# Patient Record
Sex: Female | Born: 1940
Health system: Southern US, Community
[De-identification: ages and names within clinical notes are randomized; demographics above are authoritative.]

## PROBLEM LIST (undated history)

## (undated) DIAGNOSIS — J302 Other seasonal allergic rhinitis: Secondary | ICD-10-CM

## (undated) DIAGNOSIS — K219 Gastro-esophageal reflux disease without esophagitis: Secondary | ICD-10-CM

## (undated) DIAGNOSIS — Z8619 Personal history of other infectious and parasitic diseases: Secondary | ICD-10-CM

## (undated) DIAGNOSIS — L239 Allergic contact dermatitis, unspecified cause: Secondary | ICD-10-CM

## (undated) DIAGNOSIS — S83232A Complex tear of medial meniscus, current injury, left knee, initial encounter: Secondary | ICD-10-CM

## (undated) DIAGNOSIS — K5792 Diverticulitis of intestine, part unspecified, without perforation or abscess without bleeding: Secondary | ICD-10-CM

## (undated) DIAGNOSIS — I10 Essential (primary) hypertension: Secondary | ICD-10-CM

## (undated) DIAGNOSIS — H903 Sensorineural hearing loss, bilateral: Secondary | ICD-10-CM

## (undated) DIAGNOSIS — M1990 Unspecified osteoarthritis, unspecified site: Secondary | ICD-10-CM

## (undated) DIAGNOSIS — D126 Benign neoplasm of colon, unspecified: Secondary | ICD-10-CM

## (undated) DIAGNOSIS — L309 Dermatitis, unspecified: Secondary | ICD-10-CM

## (undated) DIAGNOSIS — E785 Hyperlipidemia, unspecified: Secondary | ICD-10-CM

## (undated) HISTORY — DX: Hyperlipidemia, unspecified: E78.5

## (undated) HISTORY — DX: Essential (primary) hypertension: I10

## (undated) HISTORY — PX: COLONOSCOPY: SHX174

## (undated) HISTORY — DX: Allergic contact dermatitis, unspecified cause: L23.9

## (undated) HISTORY — DX: Unspecified osteoarthritis, unspecified site: M19.90

## (undated) HISTORY — DX: Dermatitis, unspecified: L30.9

## (undated) HISTORY — PX: CHOLECYSTECTOMY: SHX55

## (undated) HISTORY — PX: TUBAL LIGATION: SHX77

## (undated) HISTORY — PX: HEMORRHOID SURGERY: SHX153

---

## 2005-05-29 ENCOUNTER — Encounter: Payer: Self-pay | Admitting: Family Medicine

## 2008-01-24 ENCOUNTER — Ambulatory Visit: Payer: Self-pay | Admitting: Cardiology

## 2008-01-25 ENCOUNTER — Ambulatory Visit: Payer: Self-pay | Admitting: Cardiology

## 2008-01-25 LAB — CONVERTED CEMR LAB
Alkaline Phosphatase: 78 units/L (ref 39–117)
BUN: 17 mg/dL (ref 6–23)
Cholesterol: 157 mg/dL (ref 0–200)
Glucose, Bld: 88 mg/dL (ref 70–99)
HDL: 70 mg/dL (ref >39)
LDL Cholesterol: 70 mg/dL (ref 0–99)
Total Bilirubin: 0.6 mg/dL (ref 0.3–1.2)
Triglycerides: 84 mg/dL (ref <150)
VLDL: 17 mg/dL (ref 0–40)

## 2008-03-20 ENCOUNTER — Ambulatory Visit: Payer: Self-pay | Admitting: Family Medicine

## 2008-03-20 DIAGNOSIS — M1990 Unspecified osteoarthritis, unspecified site: Secondary | ICD-10-CM | POA: Insufficient documentation

## 2008-03-20 DIAGNOSIS — I10 Essential (primary) hypertension: Secondary | ICD-10-CM | POA: Insufficient documentation

## 2008-03-20 DIAGNOSIS — J309 Allergic rhinitis, unspecified: Secondary | ICD-10-CM | POA: Insufficient documentation

## 2008-03-20 DIAGNOSIS — L259 Unspecified contact dermatitis, unspecified cause: Secondary | ICD-10-CM | POA: Insufficient documentation

## 2008-04-20 ENCOUNTER — Encounter: Payer: Self-pay | Admitting: Family Medicine

## 2008-04-27 ENCOUNTER — Encounter: Payer: Self-pay | Admitting: Family Medicine

## 2008-04-30 LAB — HM MAMMOGRAPHY: HM Mammogram: NORMAL

## 2008-05-03 ENCOUNTER — Encounter (INDEPENDENT_AMBULATORY_CARE_PROVIDER_SITE_OTHER): Payer: Self-pay | Admitting: *Deleted

## 2008-07-13 ENCOUNTER — Encounter: Payer: Self-pay | Admitting: Family Medicine

## 2008-07-13 ENCOUNTER — Ambulatory Visit: Payer: Self-pay | Admitting: Gastroenterology

## 2008-07-13 LAB — HM COLONOSCOPY

## 2008-08-03 ENCOUNTER — Encounter: Payer: Self-pay | Admitting: Family Medicine

## 2008-12-28 ENCOUNTER — Ambulatory Visit: Payer: Self-pay | Admitting: Family Medicine

## 2008-12-28 ENCOUNTER — Encounter: Payer: Self-pay | Admitting: Family Medicine

## 2008-12-28 ENCOUNTER — Other Ambulatory Visit: Admission: RE | Admit: 2008-12-28 | Discharge: 2008-12-28 | Payer: Self-pay | Admitting: Family Medicine

## 2008-12-28 DIAGNOSIS — M549 Dorsalgia, unspecified: Secondary | ICD-10-CM | POA: Insufficient documentation

## 2008-12-28 LAB — CONVERTED CEMR LAB
Bilirubin Urine: NEGATIVE
Glucose, Urine, Semiquant: NEGATIVE
Protein, U semiquant: NEGATIVE
Specific Gravity, Urine: 1.01
Urobilinogen, UA: 0.2

## 2009-01-01 ENCOUNTER — Encounter (INDEPENDENT_AMBULATORY_CARE_PROVIDER_SITE_OTHER): Payer: Self-pay | Admitting: *Deleted

## 2009-01-10 ENCOUNTER — Ambulatory Visit: Payer: Self-pay | Admitting: Family Medicine

## 2009-01-16 LAB — CONVERTED CEMR LAB
AST: 21 units/L (ref 0–37)
Alkaline Phosphatase: 87 units/L (ref 39–117)
BUN: 14 mg/dL (ref 6–23)
Chloride: 102 meq/L (ref 96–112)
Cholesterol: 156 mg/dL (ref 0–200)
GFR calc non Af Amer: 59 mL/min
LDL Cholesterol: 86 mg/dL (ref 0–99)
Potassium: 3.8 meq/L (ref 3.5–5.1)
Total Bilirubin: 0.9 mg/dL (ref 0.3–1.2)
Total CHOL/HDL Ratio: 3

## 2009-04-25 ENCOUNTER — Encounter: Payer: Self-pay | Admitting: Family Medicine

## 2009-06-19 ENCOUNTER — Ambulatory Visit: Payer: Self-pay | Admitting: Family Medicine

## 2009-06-19 DIAGNOSIS — J019 Acute sinusitis, unspecified: Secondary | ICD-10-CM | POA: Insufficient documentation

## 2009-09-02 ENCOUNTER — Ambulatory Visit: Payer: Self-pay | Admitting: Family Medicine

## 2009-09-02 DIAGNOSIS — J069 Acute upper respiratory infection, unspecified: Secondary | ICD-10-CM | POA: Insufficient documentation

## 2010-01-14 ENCOUNTER — Ambulatory Visit: Payer: Self-pay | Admitting: Family Medicine

## 2010-01-14 DIAGNOSIS — R0989 Other specified symptoms and signs involving the circulatory and respiratory systems: Secondary | ICD-10-CM

## 2010-01-14 DIAGNOSIS — R0609 Other forms of dyspnea: Secondary | ICD-10-CM | POA: Insufficient documentation

## 2010-01-15 ENCOUNTER — Ambulatory Visit: Payer: Self-pay | Admitting: Family Medicine

## 2010-01-15 DIAGNOSIS — R5383 Other fatigue: Secondary | ICD-10-CM

## 2010-01-15 DIAGNOSIS — R5381 Other malaise: Secondary | ICD-10-CM

## 2010-01-16 LAB — CONVERTED CEMR LAB
Albumin: 3.9 g/dL (ref 3.5–5.2)
Alkaline Phosphatase: 84 units/L (ref 39–117)
Basophils Relative: 0.4 % (ref 0.0–3.0)
Bilirubin, Direct: 0.1 mg/dL (ref 0.0–0.3)
Calcium: 9.1 mg/dL (ref 8.4–10.5)
Eosinophils Absolute: 0.5 10*3/uL (ref 0.0–0.7)
GFR calc non Af Amer: 58.84 mL/min (ref >60)
HDL: 58 mg/dL (ref >39.00)
Hemoglobin: 12.7 g/dL (ref 12.0–15.0)
Lymphocytes Relative: 35 % (ref 12.0–46.0)
MCHC: 32.9 g/dL (ref 30.0–36.0)
MCV: 94.2 fL (ref 78.0–100.0)
Neutro Abs: 4.3 10*3/uL (ref 1.4–7.7)
RBC: 4.1 M/uL (ref 3.87–5.11)
Sodium: 140 meq/L (ref 135–145)
Total CHOL/HDL Ratio: 3
VLDL: 15.6 mg/dL (ref 0.0–40.0)

## 2010-01-28 ENCOUNTER — Ambulatory Visit: Payer: Self-pay

## 2010-01-28 ENCOUNTER — Encounter: Payer: Self-pay | Admitting: Family Medicine

## 2010-01-30 ENCOUNTER — Telehealth: Payer: Self-pay | Admitting: Family Medicine

## 2010-02-06 ENCOUNTER — Ambulatory Visit: Payer: Self-pay | Admitting: Cardiovascular Disease

## 2010-02-19 ENCOUNTER — Telehealth (INDEPENDENT_AMBULATORY_CARE_PROVIDER_SITE_OTHER): Payer: Self-pay | Admitting: *Deleted

## 2010-11-25 ENCOUNTER — Encounter: Payer: Self-pay | Admitting: Family Medicine

## 2010-12-30 NOTE — Progress Notes (Signed)
Summary: HYDROCHLOROTHIAZIDE 25 MG  Phone Note Refill Request Call back at Home Phone [phone] Message from:  Patient on February 19, 2010 11:36 AM  Refills Requested: Medication #1:  HYDROCHLOROTHIAZIDE 25 MG  TABS Take 1/2 tablet by mouth once daily [facility] [address]   Initial call taken by: Melody Comas,  February 19, 2010 11:36 AM    Prescriptions: HYDROCHLOROTHIAZIDE 25 MG  TABS (HYDROCHLOROTHIAZIDE) Take 1/2 tablet by mouth once daily  #30.0 Each x 11   Entered by:   Benny Lennert CMA (AAMA)   Authorized by:   Kerby Nora MD   Signed by:   Benny Lennert CMA (AAMA) on 02/19/2010   Method used:   Electronically to        [facility] [address] [phone]* (retail)       [address] [place]  [address]       Ph: [phone]       Fax: [phone]   RxID:   [id number]

## 2010-12-30 NOTE — Progress Notes (Signed)
Summary: PHI  PHI   Imported By: Harlon Flor 02/10/2010 16:01:08  _____________________________________________________________________  External Attachment:    Type:   Image     Comment:   External Document

## 2010-12-30 NOTE — Assessment & Plan Note (Signed)
Summary: CPX/CLE   Vital Signs:  Patient profile:   70 year old female Height:      65.5 inches Weight:      192.8 pounds BMI:     31.71 Temp:     98.2 degrees F oral Pulse rate:   76 / minute Pulse rhythm:   regular BP sitting:   118 / 78  (left arm) Cuff size:   regular  Vitals Entered By: Benny Lennert CMA Duncan Dull) (January 14, 2010 2:42 PM)  History of Present Illness: Chief complaint chronic medical managment  Has had some increase in SOB in last 6 months, when going up stairs.  No chest pain.  Some increase in fatigue. No new swelling in ankles. No cough, no wheeze. Continues to have post nasal drip,  sometimes has clearing cough from that. Has noted some improvement in SOB with recent increase in exercsie. Treadmill stress... 2006 low risk.  Interested in screening for carotid blockage. No specific symptoms. Has had high chol for 10 years. PGF: had CVA.   Had noted blister right 2nd finger DIP joint after raking 1 week ago. Was wearing gloves. Had similar in past.  Arhtritis in DIP joints B hands.  Some intermittant rash under right breast..red irritated area. ? allergy to soap.  not itchy.  Hemmorhoids irritated off and on.   Problems Prior to Update: 1)  Other Malaise and Fatigue  (ICD-780.79) 2)  Dyspnea On Exertion  (ICD-786.09) 3)  Uri  (ICD-465.9) 4)  Sinusitis - Acute-nos  (ICD-461.9) 5)  Hypertension  (ICD-401.9) 6)  Hyperlipidemia  (ICD-272.4) 7)  Back Pain, Acute  (ICD-724.5) 8)  Osteoarthritis  (ICD-715.90) 9)  Dermatitis, Allergic  (ICD-692.9) 10)  Allergic Rhinitis  (ICD-477.9) 11)  Family History of Colon Ca 1st Degree Relative <60  (ICD-V16.0) 12)  Other Screening Mammogram  (ICD-V76.12) 13)  Special Screening For Malignant Neoplasms Colon  (ICD-V76.51) 14)  Special Screening For Osteoporosis  (ICD-V82.81)  Current Medications (verified): 1)  Lisinopril 10 Mg  Tabs (Lisinopril) .... Take 1 Tablet By Mouth Once A Day 2)   Hydrochlorothiazide 25 Mg  Tabs (Hydrochlorothiazide) .... Take 1/2 Tablet By Mouth Once Daily 3)  Lipitor 10 Mg  Tabs (Atorvastatin Calcium) .... Take 1 Tablet By Mouth Once A Day 4)  Fibercon 625 Mg  Tabs (Calcium Polycarbophil) .... Takes 1 Capsules About 3 Times Per Week 5)  Aspir-Low 81 Mg Tbec (Aspirin) .... Once Daily 6)  Proctosol Hc 2.5 % Crea (Hydrocortisone) .... Apply To Affected Area Two Times A Day 7)  Fluticasone Propionate 50 Mcg/act Susp (Fluticasone Propionate) .... 2 Spray Per Nostril Daily  Allergies (verified): No Known Drug Allergies  Past History:  Past medical, surgical, family and social histories (including risk factors) reviewed, and no changes noted (except as noted below).  Past Medical History: Reviewed history from 05/23/2009 and no changes required. HYPERTENSION (ICD-401.9) HYPERLIPIDEMIA (ICD-272.4) BACK PAIN, ACUTE (ICD-724.5) OSTEOARTHRITIS (ICD-715.90) DERMATITIS, ALLERGIC (ICD-692.9) ALLERGIC RHINITIS (ICD-477.9)      Past Surgical History: Cholecystectomy Hemorrhoidectomy 2006 treadmill stress test..low risk  Family History: Reviewed history from 03/20/2008 and no changes required. father: emphesema, DM mother: HTN, colon cancer age 47 sister: breast cancer brother: bladder cancer, CAD PGM: enlarged heart PGF: CVA MGM: CAD MGF: measles Family History of Colon CA 1st degree relative <60  Social History: Reviewed history from 03/20/2008 and no changes required. Occupation: retired Librarian, academic Married 2 boys: healthy Never Smoked Alcohol use-yes, occ glass of wine Drug use-no Regular exercise-yes, walking  3 days a week  Review of Systems General:  Complains of fatigue. CV:  Complains of shortness of breath with exertion; denies chest pain or discomfort, difficulty breathing while lying down, and leg cramps with exertion. Resp:  Denies cough and wheezing. GI:  Denies abdominal pain. GU:  Denies dysuria.  Physical  Exam  General:  overwieght  appearing female in NAD Mouth:  good dentition, pharynx pink and moist, no exudates, but postnasal drip.   Neck:  no cervical or supraclavicular lymphadenopathy  Chest Wall:  No deformities, masses, or tenderness noted. Breasts:  No mass, nodules, thickening, tenderness, bulging, retraction, inflamation, nipple discharge or skin changes noted.   Lungs:  Normal respiratory effort, chest expands symmetrically. Lungs are clear to auscultation, no crackles or wheezes. Heart:  Normal rate and regular rhythm. S1 and S2 normal without gallop, murmur, click, rub or other extra sounds. Abdomen:  Bowel sounds positive,abdomen soft and non-tender without masses, organomegaly or hernias noted. Msk:  No deformity or scoliosis noted of thoracic or lumbar spine.   Pulses:  R and L posterior tibial pulses are full and equal bilaterally  Extremities:  non edema Neurologic:  No cranial nerve deficits noted. Station and gait are normal. Plantar reflexes are down-going bilaterally. DTRs are symmetrical throughout. Sensory, motor and coordinative functions appear intact. Skin:  Intact without suspicious lesions or rashes Psych:  Cognition and judgment appear intact. Alert and cooperative with normal attention span and concentration. No apparent delusions, illusions, hallucinations   Impression & Recommendations:  Problem # 1:  DYSPNEA ON EXERTION (ICD-786.09) Moderate cardiac risk given age, overweight, HTN, high chol...cardiac stres test low risk in 2006 No celar evidence of fluoid overload and CHF...wil eval with ECHo. Will also eval for other sources given fatigue..cbc, TSH, etc.  Lung exam clear..some congestion allergies.Marland Kitchenno wheezing..? pulm source.  Her updated medication list for this problem includes:    Lisinopril 10 Mg Tabs (Lisinopril) .Marland Kitchen... Take 1 tablet by mouth once a day    Hydrochlorothiazide 25 Mg Tabs (Hydrochlorothiazide) .Marland Kitchen... Take 1/2 tablet by mouth once  daily  Orders: EKG w/ Interpretation (93000) Echo Referral (Echo)  Problem # 2:  OTHER MALAISE AND FATIGUE (ICD-780.79) Eval with labs as stated above.   Problem # 3:  URI (ICD-465.9)  Her updated medication list for this problem includes:    Aspir-low 81 Mg Tbec (Aspirin) ..... Once daily  Instructed on symptomatic treatment. Call if symptoms persist or worsen.   Problem # 4:  HYPERTENSION (ICD-401.9) Well controlled. Continue current medication.  Her updated medication list for this problem includes:    Lisinopril 10 Mg Tabs (Lisinopril) .Marland Kitchen... Take 1 tablet by mouth once a day    Hydrochlorothiazide 25 Mg Tabs (Hydrochlorothiazide) .Marland Kitchen... Take 1/2 tablet by mouth once daily  BP today: 118/78 Prior BP: 120/78 (09/02/2009)  Labs Reviewed: K+: 3.8 (01/10/2009) Creat: : 1.0 (01/10/2009)   Chol: 156 (01/10/2009)   HDL: 51.9 (01/10/2009)   LDL: 86 (01/10/2009)   TG: 92 (01/10/2009)  Problem # 5:  HYPERLIPIDEMIA (ICD-272.4) Well controlled. Continue current medication.  Her updated medication list for this problem includes:    Lipitor 10 Mg Tabs (Atorvastatin calcium) .Marland Kitchen... Take 1 tablet by mouth once a day  Labs Reviewed: SGOT: 21 (01/10/2009)   SGPT: 14 (01/10/2009)   HDL:51.9 (01/10/2009), 70 (01/25/2008)  LDL:86 (01/10/2009), 70 (01/25/2008)  Chol:156 (01/10/2009), 157 (01/25/2008)  Trig:92 (01/10/2009), 84 (01/25/2008)  Problem # 6:  Preventive Health Care (ICD-V70.0) Assessment: Comment Only PAP smear every  2-3 years.Marland Kitchenlast done last year, nml.  The patient's preventative maintenance and recommended screening tests for an annual wellness exam were reviewed in full today. Brought up to date unless services declined.  Counselled on the importance of diet, exercise, and its role in overall health and mortality. The patient's FH and SH was reviewed, including their home life, tobacco status, and drug and alcohol status.     Complete Medication List: 1)  Lisinopril 10 Mg  Tabs (Lisinopril) .... Take 1 tablet by mouth once a day 2)  Hydrochlorothiazide 25 Mg Tabs (Hydrochlorothiazide) .... Take 1/2 tablet by mouth once daily 3)  Lipitor 10 Mg Tabs (Atorvastatin calcium) .... Take 1 tablet by mouth once a day 4)  Fibercon 625 Mg Tabs (Calcium polycarbophil) .... Takes 1 capsules about 3 times per week 5)  Aspir-low 81 Mg Tbec (Aspirin) .... Once daily 6)  Proctosol Hc 2.5 % Crea (Hydrocortisone) .... Apply to affected area two times a day 7)  Fluticasone Propionate 50 Mcg/act Susp (Fluticasone propionate) .... 2 spray per nostril daily 8)  Vitamin D (ergocalciferol) 50000 Unit Caps (Ergocalciferol) .Marland Kitchen.. 1 tab by mouth weekly x 12 weeks  Other Orders: Radiology Referral (Radiology)  Patient Instructions: 1)  Fasting lipids, CMET Dx 272.0 2)  TSH, cbc, B12 , vit D Dx 780.79 3)  Referral Appointment Information 4)  Day/Date: 5)  Time: 6)  Place/MD: 7)  Address: 8)  Phone/Fax: 9)  Patient given appointment information. Information/Orders faxed/mailed.  10)  Start nasal steroid 2 puffs daily. 11)  Apply steroid to rectum two times a day.  12)  MAy use OTC steroid cream on right breast rash two times a day if desired.  Prescriptions: FLUTICASONE PROPIONATE 50 MCG/ACT SUSP (FLUTICASONE PROPIONATE) 2 spray per nostril daily  #1 x 3   Entered and Authorized by:   Kerby Nora MD   Signed by:   Kerby Nora MD on 01/14/2010   Method used:   Electronically to        [facility] [address] [phone]* (retail)       [address] [place], [place]  [address]       Ph: [phone]       Fax: [phone]   RxID:   [id number] PROCTOSOL HC 2.5 % CREA (HYDROCORTISONE) Apply to affected area two times a day  #1 x 2   Entered and Authorized by:   Kerby Nora MD   Signed by:   Kerby Nora MD on 01/14/2010   Method used:   Electronically to        [facility] [address] [phone]* (retail)       [address] [place], [place]  [address]       Ph:  [phone]       Fax: [phone]   RxID:   [id number]   Current Allergies (reviewed today): No known allergies   Last PAP:  NEGATIVE FOR INTRAEPITHELIAL LESIONS OR MALIGNANCY. (12/28/2008 12:00:00 AM) PAP Next Due:  2 yr   Past Medical History:    Reviewed history from 05/23/2009 and no changes required:       HYPERTENSION (ICD-401.9)       HYPERLIPIDEMIA (ICD-272.4)       BACK PAIN, ACUTE (ICD-724.5)       OSTEOARTHRITIS (ICD-715.90)       DERMATITIS, ALLERGIC (ICD-692.9)       ALLERGIC RHINITIS (ICD-477.9)  Past Surgical History:    Reviewed history from 03/20/2008 and no changes required:       Cholecystectomy       Hemorrhoidectomy       2006 treadmill stress test..low risk

## 2010-12-30 NOTE — Progress Notes (Signed)
Summary: pt agrees to cardio referral  Phone Note Outgoing Call Call back at [facility] Phone [phone]   Summary of Call: Advised pt of 2D echo report.  She agrees to referral, has met Dr. Lenard Galloway before, wants to go to him. Initial call taken by: Lowella Petties CMA,  January 30, 2010 11:41 AM

## 2010-12-30 NOTE — Assessment & Plan Note (Signed)
Summary: NEW PT  Medications Added CRESTOR 20 MG TABS (ROSUVASTATIN CALCIUM) Take one tablet by mouth daily.      Allergies Added: ! * SHELLFISH  Visit Type:  New Patient Referring Provider:  Kerby Nora, MD Primary Provider:  Kerby Nora MD  CC:  chest pains and some shortness of breath with exertion and bending over; no edema.  History of Present Illness: Vanessa Jones is a very pleasant 70 year old woman with a history of hypertension, hyperlipidemia, osteoarthritis who presented to Dr. Ermalene Searing for workup of her dyspnea on exertion. She presents to discuss her echo findings.  Vanessa Jones states that she's been feeling well. She has had some very short episodes of chest discomfort, some shortness of breath sometimes with exertion and when she bends over. She denies any lightheadedness, lower extremity edema, diaphoresis. She is typically very active. She walks 30 minutes twice a week. She states that she sleeps well, and feels rested when she wakes up. She sleeps on one pillow.  She has been on Lipitor since 2001. She denies any significant coronary disease in her parents or her siblings. She retired in 2004.  echo findings shows normal left and right ventricular systolic function, mildly dilated left ventricle and left atrium, no significant valvular disease, normal right ventricular systolic pressures, aneurysmal intra-atrial septum.   Current Problems (verified): 1)  Other Malaise and Fatigue  (ICD-780.79) 2)  Dyspnea On Exertion  (ICD-786.09) 3)  Uri  (ICD-465.9) 4)  Sinusitis - Acute-nos  (ICD-461.9) 5)  Hypertension  (ICD-401.9) 6)  Hyperlipidemia  (ICD-272.4) 7)  Back Pain, Acute  (ICD-724.5) 8)  Osteoarthritis  (ICD-715.90) 9)  Dermatitis, Allergic  (ICD-692.9) 10)  Allergic Rhinitis  (ICD-477.9) 11)  Family History of Colon Ca 1st Degree Relative <60  (ICD-V16.0) 12)  Other Screening Mammogram  (ICD-V76.12) 13)  Special Screening For Malignant Neoplasms Colon   (ICD-V76.51) 14)  Special Screening For Osteoporosis  (ICD-V82.81)  Current Medications (verified): 1)  Lisinopril 10 Mg  Tabs (Lisinopril) .... Take 1 Tablet By Mouth Once A Day 2)  Hydrochlorothiazide 25 Mg  Tabs (Hydrochlorothiazide) .... Take 1/2 Tablet By Mouth Once Daily 3)  Lipitor 10 Mg  Tabs (Atorvastatin Calcium) .... Take 1 Tablet By Mouth Once A Day 4)  Fibercon 625 Mg  Tabs (Calcium Polycarbophil) .... Takes 1 Capsules About 3 Times Per Week 5)  Aspir-Low 81 Mg Tbec (Aspirin) .... Once Daily 6)  Proctosol Hc 2.5 % Crea (Hydrocortisone) .... Apply To Affected Area Two Times A Day 7)  Fluticasone Propionate 50 Mcg/act Susp (Fluticasone Propionate) .... 2 Spray Per Nostril Daily 8)  Vitamin D (Ergocalciferol) 50000 Unit Caps (Ergocalciferol) .Marland Kitchen.. 1 Tab By Mouth Weekly X 12 Weeks  Allergies (verified): 1)  ! * Shellfish  Past History:  Past Medical History: Last updated: 05/23/2009 HYPERTENSION (ICD-401.9) HYPERLIPIDEMIA (ICD-272.4) BACK PAIN, ACUTE (ICD-724.5) OSTEOARTHRITIS (ICD-715.90) DERMATITIS, ALLERGIC (ICD-692.9) ALLERGIC RHINITIS (ICD-477.9)      Past Surgical History: Last updated: 01/14/2010 Cholecystectomy Hemorrhoidectomy 2006 treadmill stress test..low risk  Family History: Last updated: 03/20/2008 father: emphesema, DM mother: HTN, colon cancer age 6 sister: breast cancer brother: bladder cancer, CAD PGM: enlarged heart PGF: CVA MGM: CAD MGF: measles Family History of Colon CA 1st degree relative <60  Social History: Last updated: 03/20/2008 Occupation: retired Librarian, academic Married 2 boys: healthy Never Smoked Alcohol use-yes, occ glass of wine Drug use-no Regular exercise-yes, walking 3 days a week  Risk Factors: Exercise: yes (03/20/2008)  Risk Factors: Smoking Status: never (03/20/2008)  Review of Systems       The patient complains of dyspnea on exertion.  The patient denies fever, weight loss, weight gain, vision loss,  decreased hearing, hoarseness, chest pain, syncope, peripheral edema, prolonged cough, abdominal pain, incontinence, muscle weakness, difficulty walking, depression, unusual weight change, and enlarged lymph nodes.    Vital Signs:  Patient profile:   70 year old female Height:      65.5 inches Weight:      195.50 pounds BMI:     32.15 Pulse rate:   88 / minute Pulse rhythm:   regular BP sitting:   124 / 78  (left arm) Cuff size:   regular  Vitals Entered By: Vanessa Jones (February 06, 2010 4:24 PM)  Physical Exam  General:  well appearing middle-aged woman in no apparent distress, H. EENT exam is benign, neck is supple with no JVP or carotid bruits, heart sounds are regular with normal S1 and S2 and no murmurs appreciated, lungs are clear to auscultation with no wheezes or rales, abdominal exam is benign, no significant lower extremity edema, neurologic exam is nonfocal and skin is warm and dry. Pulses are equal and symmetrical in her upper and lower extremities.   Impression & Recommendations:  Problem # 1:  DYSPNEA ON EXERTION (ICD-786.09) we went through the echocardiogram with Vanessa Jones and overall it is essentially normal. Left ventricle and right ventricle have normal function. Incidental noting of a mildly dilated left ventricle and left atrium were noted though in the setting of normal blood pressure, normal left ventricle function, normal right ventricular systolic pressures, this is likely a benign finding. As her right ventricular systolic pressures are not elevated, the cause of her shortness of breath does not appear to be cardiac and not due to pulmonary hypertension.  I have suggested that she continue to exercise. If she has no significant chest pain and her breathing seems appropriate for the level exercise, we would do no further testing. If she has worsening shortness of breath or chest pain with exertion , we could potentially do stress testing.  Her updated  medication list for this problem includes:    Lisinopril 10 Mg Tabs (Lisinopril) .Marland Kitchen... Take 1 tablet by mouth once a day    Hydrochlorothiazide 25 Mg Tabs (Hydrochlorothiazide) .Marland Kitchen... Take 1/2 tablet by mouth once daily    Aspir-low 81 Mg Tbec (Aspirin) ..... Once daily  Problem # 2:  HYPERLIPIDEMIA (ICD-272.4) she has been on Lipitor 10 mg daily over the past 10 years. Cholesterol of 157, LDL 83. This is in a very reasonable range given that she has no known coronary artery disease.  She did comment on having some problems with Crestor such as muscle ache, memory issues. She wanted to try an alternate statin and we have given her some samples of Crestor 10 mg per and she will cut it in half for a total of 5 mg a day. If she prefers this over to Lipitor, I have asked her to contact either myself or Dr. Ermalene Searing for a prescription..  Her updated medication list for this problem includes:    Crestor 5 Mg Tabs (Rosuvastatin calcium) .Marland Kitchen... Take one tablet by mouth daily.  Problem # 3:  HYPERTENSION (ICD-401.9) Blood pressure was well controlled on her current medication regimen. No further changes indicated and she seems to have no side effects on her current doses.  Her updated medication list for this problem includes:    Lisinopril 10 Mg Tabs (Lisinopril) .Marland KitchenMarland KitchenMarland KitchenMarland Kitchen  Take 1 tablet by mouth once a day    Hydrochlorothiazide 25 Mg Tabs (Hydrochlorothiazide) .Marland Kitchen... Take 1/2 tablet by mouth once daily    Aspir-low 81 Mg Tbec (Aspirin) ..... Once daily Prescriptions: CRESTOR 20 MG TABS (ROSUVASTATIN CALCIUM) Take one tablet by mouth daily.  #90 x 3   Entered by:   Charlena Cross, RN, BSN   Authorized by:   Dossie Arbour MD   Signed by:   Charlena Cross, RN, BSN on 02/06/2010   Method used:   Electronically to        [facility] [address] [phone]* (retail)       [address] [place]  [address]       Ph: [phone]       Fax: [phone]   RxID:   [id number]   Appended  Document: NEW PT spoke with [facility] regarding crestor- insurance will only minimally cover crestor.  pt has decided to remain on lipitor.  Charlena Cross RN BSN

## 2011-01-01 NOTE — Medication Information (Signed)
Summary: Rx for Atorvastatin  Rx for Atorvastatin   Imported By: Maryln Gottron 12/03/2010 09:54:02  _____________________________________________________________________  External Attachment:    Type:   Image     Comment:   External Document

## 2011-02-10 ENCOUNTER — Telehealth: Payer: Self-pay | Admitting: Family Medicine

## 2011-02-17 NOTE — Progress Notes (Signed)
Summary: pt wants labs  Phone Note Call from Patient   Caller: Patient Call For: Kerby Nora MD Summary of Call: Pt is coming for labs in June, and she is asking if she can have CRP included in that. She wants a complete lab workup.               Lowella Petties CMA, AAMA  February 10, 2011 10:46 AM,   Follow-up for Phone Call        Okay to add CRP Dx 401.1 272.0 Follow-up by: Kerby Nora MD,  February 10, 2011 10:55 AM  Additional Follow-up for Phone Call Additional follow up Details #1::        add to labs.Consuello Masse CMA   Additional Follow-up by: Benny Lennert CMA Duncan Dull),  February 10, 2011 11:03 AM

## 2011-03-04 ENCOUNTER — Other Ambulatory Visit: Payer: Self-pay | Admitting: Family Medicine

## 2011-03-27 ENCOUNTER — Other Ambulatory Visit: Payer: Self-pay | Admitting: Family Medicine

## 2011-04-14 NOTE — Assessment & Plan Note (Signed)
[facility] OFFICE NOTE   Vanessa Jones                     MRN:          [id number]  DATE:01/24/2008                            DOB:          [birth year]    Vanessa Jones is a very pleasant 70 year old female who presents for  establishment.  She has previously been followed in [place], where she  lived prior to moving here in December.  She has a history of  hypertension, hyperlipidemia that was being managed there.  She has no  prior cardiac history.  I do have a nuclear study from December 07, 2005  that showed no evidence of ischemia or scar and her ejection fraction  was 69%.  Note, she typically does not have dyspnea on exertion,  orthopnea, PND, pedal edema, palpitations, pre-syncope, syncope or  exertional chest pain.  She does occasionally have arthralgias in her  hips and knees.  However, she has not been exercising routinely as she  has in the past.  She wanted to be seen for her above problems and  presented as a new patient.   ALLERGIES:  SHE HAS AN ALLERGY TO SHELLFISH.   MEDICATIONS:  1. Lisinopril 10 mg daily.  2. Lipitor 10 mg p.o. daily.  3. Hydrochlorothiazide 25 mg daily.   SOCIAL HISTORY:  She does not smoke.  She occasionally consumes wine.   FAMILY HISTORY:  Is significant for a brother who had stents placed but  it was in his late 51s.  She otherwise has no coronary disease in her  family.   PAST MEDICAL HISTORY:  1. Significant for hypertension.  2. Hyperlipidemia.  3. There is no diabetes mellitus.  4. She had a prior cholecystectomy and  hemorrhoidectomy.  There is no      other past medical history noted.   REVIEW OF SYSTEMS:  She denies any headaches or fevers, chills.  There  is no productive cough or hemoptysis.  There is no dysphagia,  odynophagia, melena or hematochezia.  There is no dysuria, hematuria.  There is no rash or seizure activity.  There is no orthopnea,  PND or  pedal edema.  There is no claudication, but she does complain of pain in  her knees and hips when she ambulates.  Remaining systems are negative.   PHYSICAL EXAMINATION:  Today shows a blood pressure of 142/80 and pulse  is 81.  She weighs 183 pounds.  She is well-developed and well nourished  and in no acute distress at present.  She does not appear to be depressed.  There is no peripheral clubbing.  Her back is normal.  HEENT: Is normal with normal eyelids.  NECK: Supple, with a normal upstroke bilaterally. Cannot appreciate  bruits. There is no jugular venous distention and no thyromegaly is  noted.  CHEST: Clear to auscultation, normal expansion.  CARDIOVASCULAR: Reveals a regular rate and rhythm, normal S1, S2. There  are no murmurs, rubs or gallops noted.  ABDOMEN: Nontender. Positive bowel sounds. No hepatosplenomegaly. No  masses appreciated. There is no abdominal bruit.  She has 2+ femoral pulses  bilaterally. No bruits.  EXTREMITIES: Show no edema.  I can palpate no cords. She has 2+ dorsalis  pedis pulses bilaterally.  NEUROLOGIC: Examination is grossly intact.   Her electrocardiogram shows sinus rhythm at a rate of 81.  There are no  significant ST changes.   DIAGNOSES:  1. Hyperlipidemia - Vanessa Jones will continue on her Lipitor 10 mg      daily.  We will check lipids and liver and adjust as indicated.      Note, she had been on Niaspan the past but took herself off in      September due to the cost. Also note, she is having some      arthralgias and wonders whether it may be related to Lipitor.      However, she also feels that it is arthritis and we will ask one of      the primary care physicians to establish with her and this can be      evaluated further.  We could consider stopping her statin in the      future if we feel like this is related to Lipitor, although I think      this is unlikely.  I will check a CK.  2. Hypertension - Her blood pressure  is mildly elevated today, but she      will track this and we will increase lisinopril as needed.  Will      check a BMET given her ACE inhibitor and hydrochlorothiazide use.  3. She will continue risk factor modification including diet, exercise      and we discussed importance of this.  She does not smoke.  We will      arrange to her for to have a primary care physician at [facility].   I will see back in 6 months.     Madolyn Frieze Jens Som, MD, [facility]  Electronically Signed    BSC/MedQ  DD: 01/24/2008  DT: 01/24/2008  Job #: [id number]

## 2011-05-05 ENCOUNTER — Telehealth: Payer: Self-pay | Admitting: Family Medicine

## 2011-05-05 DIAGNOSIS — E785 Hyperlipidemia, unspecified: Secondary | ICD-10-CM

## 2011-05-05 DIAGNOSIS — I10 Essential (primary) hypertension: Secondary | ICD-10-CM

## 2011-05-05 DIAGNOSIS — R5381 Other malaise: Secondary | ICD-10-CM

## 2011-05-05 NOTE — Telephone Encounter (Signed)
Message copied by Excell Seltzer on Tue May 05, 2011  5:22 PM ------      Message from: [name]      Created: [name] May 04, 2011  2:26 PM       Patient is scheduled [name] for CPX labs, please order future labs, Thanks , Vanessa Jones

## 2011-05-06 ENCOUNTER — Other Ambulatory Visit (INDEPENDENT_AMBULATORY_CARE_PROVIDER_SITE_OTHER): Payer: Medicare Other | Admitting: Family Medicine

## 2011-05-06 DIAGNOSIS — I10 Essential (primary) hypertension: Secondary | ICD-10-CM

## 2011-05-06 DIAGNOSIS — R5381 Other malaise: Secondary | ICD-10-CM

## 2011-05-06 DIAGNOSIS — R5383 Other fatigue: Secondary | ICD-10-CM

## 2011-05-06 DIAGNOSIS — E785 Hyperlipidemia, unspecified: Secondary | ICD-10-CM

## 2011-05-06 LAB — CBC WITH DIFFERENTIAL/PLATELET
Basophils Absolute: 0 10*3/uL (ref 0.0–0.1)
Eosinophils Absolute: 0.5 10*3/uL (ref 0.0–0.7)
Hemoglobin: 13.8 g/dL (ref 12.0–15.0)
Lymphocytes Relative: 39.2 % (ref 12.0–46.0)
MCHC: 33.4 g/dL (ref 30.0–36.0)
Monocytes Relative: 6.6 % (ref 3.0–12.0)
Neutro Abs: 4.5 10*3/uL (ref 1.4–7.7)
Platelets: 318 10*3/uL (ref 150.0–400.0)
RDW: 14 % (ref 11.5–14.6)

## 2011-05-06 LAB — COMPREHENSIVE METABOLIC PANEL
ALT: 12 U/L (ref 0–35)
Albumin: 4.5 g/dL (ref 3.5–5.2)
CO2: 26 mEq/L (ref 19–32)
Calcium: 9.3 mg/dL (ref 8.4–10.5)
Chloride: 102 mEq/L (ref 96–112)
Creatinine, Ser: 0.9 mg/dL (ref 0.4–1.2)
GFR: 70.7 mL/min (ref >60.00)
Potassium: 4.1 mEq/L (ref 3.5–5.1)
Total Protein: 7.3 g/dL (ref 6.0–8.3)

## 2011-05-06 LAB — VITAMIN B12: Vitamin B-12: 250 pg/mL (ref 211–911)

## 2011-05-06 LAB — TSH: TSH: 1.58 u[IU]/mL (ref 0.35–5.50)

## 2011-05-07 LAB — VITAMIN D 25 HYDROXY (VIT D DEFICIENCY, FRACTURES): Vit D, 25-Hydroxy: 34 ng/mL (ref 30–89)

## 2011-05-07 LAB — C-REACTIVE PROTEIN: CRP: 0.2 mg/dL (ref <0.6)

## 2011-05-09 ENCOUNTER — Encounter: Payer: Self-pay | Admitting: Family Medicine

## 2011-05-12 ENCOUNTER — Encounter: Payer: Self-pay | Admitting: Family Medicine

## 2011-05-12 ENCOUNTER — Ambulatory Visit (INDEPENDENT_AMBULATORY_CARE_PROVIDER_SITE_OTHER): Payer: Medicare Other | Admitting: Family Medicine

## 2011-05-12 DIAGNOSIS — E785 Hyperlipidemia, unspecified: Secondary | ICD-10-CM

## 2011-05-12 DIAGNOSIS — M1990 Unspecified osteoarthritis, unspecified site: Secondary | ICD-10-CM

## 2011-05-12 DIAGNOSIS — I10 Essential (primary) hypertension: Secondary | ICD-10-CM

## 2011-05-12 DIAGNOSIS — Z1231 Encounter for screening mammogram for malignant neoplasm of breast: Secondary | ICD-10-CM

## 2011-05-12 DIAGNOSIS — Z780 Asymptomatic menopausal state: Secondary | ICD-10-CM

## 2011-05-12 DIAGNOSIS — Z0000 Encounter for general adult medical examination without abnormal findings: Secondary | ICD-10-CM

## 2011-05-12 NOTE — Progress Notes (Signed)
Subjective:    Patient ID: Vanessa Jones, female    DOB: 1940/12/01, 70 y.o.   MRN: [id number]  HPI I have personally reviewed the Medicare Annual Wellness questionnaire and have noted 1. The patient's medical and social history 2. Their use of alcohol, tobacco or illicit drugs 3. Their current medications and supplements 4. The patient's functional ability including ADL's, fall risks, home safety risks and hearing or visual             impairment. 5. Diet and physical activities 6. Evidence for depression or mood disorders The patients weight, height, BMI and visual acuity have been recorded in the chart I have made referrals, counseling and provided education to the patient based review of the above and I have provided the pt with a written personalized care plan for preventive services.  Saw Dr. Mariah Milling in last year for discussion of ECHo... Felt begin findings.  Dyspnea occ ongoing, but minimallly. O2 Sat  97%  Feels due to weight and deconditioning.  Walking three a week. Eating regularly but moderate fat and carb intake.  Elevated Cholesterol: never tried crestor due to insurance not covering. Using medications without problems: Muscle aches: None, but makes her weak Other complaints:  Hypertension:  Well controlled  Using medication without problems or lightheadedness:  Chest pain with exertion:None Edema:None Average home BPs: Not checking. Other issues:    Review of Systems  Constitutional: Negative for fever, fatigue and unexpected weight change.  HENT: Negative for congestion and tinnitus.   Eyes: Negative for pain.  Respiratory: Negative for cough, shortness of breath and wheezing.   Cardiovascular: Negative for chest pain, palpitations and leg swelling.  Gastrointestinal: Negative for abdominal pain, diarrhea, constipation and blood in stool.       Rarely using fiber pills for constipation.  Genitourinary: Negative for dysuria and hematuria.  Neurological:  Negative for syncope, weakness and numbness.       No falls. Notes weakness, stiffness and nodules in hands B, DIP       Objective:   Physical Exam  Constitutional: Vital signs are normal. She appears well-developed and well-nourished. She is cooperative.  Non-toxic appearance. She does not appear ill. No distress.  HENT:  Head: Normocephalic.  Right Ear: Hearing, tympanic membrane, external ear and ear canal normal.  Left Ear: Hearing, tympanic membrane, external ear and ear canal normal.  Nose: Nose normal.  Eyes: Conjunctivae, EOM and lids are normal. Pupils are equal, round, and reactive to light. No foreign bodies found.  Neck: Trachea normal and normal range of motion. Neck supple. Carotid bruit is not present. No mass and no thyromegaly present.  Cardiovascular: Normal rate, regular rhythm, S1 normal, S2 normal, normal heart sounds and intact distal pulses.  Exam reveals no gallop.   No murmur heard. Pulmonary/Chest: Effort normal and breath sounds normal. No respiratory distress. She has no wheezes. She has no rhonchi. She has no rales.  Abdominal: Soft. Normal appearance and bowel sounds are normal. She exhibits no distension, no fluid wave, no abdominal bruit and no mass. There is no hepatosplenomegaly. There is no tenderness. There is no rebound, no guarding and no CVA tenderness. No hernia.  Genitourinary: No breast swelling, tenderness, discharge or bleeding. Pelvic exam was performed with patient prone.  Musculoskeletal:       Swelling , nodules and deformity in B DIP joints in hands  No erythema, no PIP, no MCP changes  Lymphadenopathy:    She has no cervical adenopathy.  She has no axillary adenopathy.  Neurological: She is alert. She has normal strength. No cranial nerve deficit or sensory deficit.  Skin: Skin is warm, dry and intact. No rash noted.  Psychiatric: Her speech is normal and behavior is normal. Judgment normal. Her mood appears not anxious. Cognition and  memory are normal. She does not exhibit a depressed mood.          Assessment & Plan:  Complet Physical Exam: The patient's preventative maintenance and recommended screening tests for an annual wellness exam were reviewed in full today. Brought up to date unless services declined.  Counselled on the importance of diet, exercise, and its role in overall health and mortality. The patient's FH and SH was reviewed, including their home life, tobacco status, and drug and alcohol status.

## 2011-05-12 NOTE — Assessment & Plan Note (Signed)
Well controlled. Continue current medication.  

## 2011-05-12 NOTE — Assessment & Plan Note (Signed)
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 Try glucosamine  500 mg 1-3 times daily.

## 2011-05-12 NOTE — Patient Instructions (Addendum)
For arthritis in hand try glucosamine 3 times a day, 500 mg daily. Call insurance to see if shingles vaccine covered. Speak with Shirlee Limerick on way out about mammogram.  Call [facility] Dr. Marva Panda to schedule colonoscopy this year.   Look into living will and HCPOA.

## 2011-05-14 ENCOUNTER — Other Ambulatory Visit: Payer: Self-pay | Admitting: Family Medicine

## 2011-05-15 ENCOUNTER — Encounter: Payer: Self-pay | Admitting: Family Medicine

## 2011-05-15 DIAGNOSIS — Z8739 Personal history of other diseases of the musculoskeletal system and connective tissue: Secondary | ICD-10-CM | POA: Insufficient documentation

## 2011-05-15 DIAGNOSIS — M8580 Other specified disorders of bone density and structure, unspecified site: Secondary | ICD-10-CM | POA: Insufficient documentation

## 2011-05-20 ENCOUNTER — Encounter: Payer: Self-pay | Admitting: *Deleted

## 2011-05-20 ENCOUNTER — Encounter: Payer: Self-pay | Admitting: Family Medicine

## 2011-05-21 ENCOUNTER — Encounter: Payer: Self-pay | Admitting: Cardiovascular Disease

## 2011-06-17 ENCOUNTER — Other Ambulatory Visit: Payer: Self-pay | Admitting: Family Medicine

## 2011-06-29 ENCOUNTER — Other Ambulatory Visit: Payer: Self-pay | Admitting: Family Medicine

## 2011-07-23 ENCOUNTER — Other Ambulatory Visit: Payer: Self-pay | Admitting: *Deleted

## 2011-07-23 MED ORDER — LISINOPRIL 10 MG PO TABS: 10.0000 mg | ORAL_TABLET | Freq: Every day | ORAL | Status: DC

## 2011-09-29 ENCOUNTER — Ambulatory Visit (INDEPENDENT_AMBULATORY_CARE_PROVIDER_SITE_OTHER): Payer: Medicare Other | Admitting: Family Medicine

## 2011-09-29 ENCOUNTER — Encounter: Payer: Self-pay | Admitting: Family Medicine

## 2011-09-29 VITALS — BP 120/78 | HR 77 | Temp 98.1°F | Ht 67.0 in | Wt 157.1 lb

## 2011-09-29 DIAGNOSIS — N139 Obstructive and reflux uropathy, unspecified: Secondary | ICD-10-CM

## 2011-09-29 DIAGNOSIS — R300 Dysuria: Secondary | ICD-10-CM

## 2011-09-29 DIAGNOSIS — N320 Bladder-neck obstruction: Secondary | ICD-10-CM

## 2011-09-29 LAB — POCT URINALYSIS DIPSTICK
Nitrite, UA: NEGATIVE
Protein, UA: NEGATIVE
Urobilinogen, UA: NEGATIVE
pH, UA: 7

## 2011-09-29 MED ORDER — SULFAMETHOXAZOLE-TRIMETHOPRIM 400-80 MG PO TABS: 1.0000 | ORAL_TABLET | Freq: Two times a day (BID) | ORAL | Status: AC
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
## 2011-10-06 ENCOUNTER — Other Ambulatory Visit: Payer: Self-pay | Admitting: *Deleted

## 2011-10-06 MED ORDER — CIPROFLOXACIN HCL 250 MG PO TABS: 250.0000 mg | ORAL_TABLET | Freq: Two times a day (BID) | ORAL | Status: AC

## 2011-10-06 NOTE — Telephone Encounter (Signed)
Pt was treated for a UTI with septra, she took her last pill last night.  Still has burning with voiding, pressure.  She is in [place] and is asking if a different antibiotic can be called to [facility] there, phone [phone].  Please call her and let her know what is done.

## 2011-10-06 NOTE — Telephone Encounter (Signed)
Patient advised and rx sent to pharmacy  

## 2011-10-06 NOTE — Telephone Encounter (Signed)
cipro 250 mg, 1 po bid, #14

## 2011-12-14 DIAGNOSIS — Z23 Encounter for immunization: Secondary | ICD-10-CM | POA: Diagnosis not present

## 2012-01-21 DIAGNOSIS — I10 Essential (primary) hypertension: Secondary | ICD-10-CM | POA: Diagnosis not present

## 2012-01-21 DIAGNOSIS — Z86010 Personal history of colonic polyps: Secondary | ICD-10-CM | POA: Diagnosis not present

## 2012-02-15 ENCOUNTER — Ambulatory Visit: Payer: Self-pay | Admitting: Gastroenterology

## 2012-02-15 DIAGNOSIS — K5730 Diverticulosis of large intestine without perforation or abscess without bleeding: Secondary | ICD-10-CM | POA: Diagnosis not present

## 2012-02-15 DIAGNOSIS — Z79899 Other long term (current) drug therapy: Secondary | ICD-10-CM | POA: Diagnosis not present

## 2012-02-15 DIAGNOSIS — K219 Gastro-esophageal reflux disease without esophagitis: Secondary | ICD-10-CM | POA: Diagnosis not present

## 2012-02-15 DIAGNOSIS — I10 Essential (primary) hypertension: Secondary | ICD-10-CM | POA: Diagnosis not present

## 2012-02-15 DIAGNOSIS — Z8371 Family history of colonic polyps: Secondary | ICD-10-CM | POA: Diagnosis not present

## 2012-02-15 DIAGNOSIS — Z09 Encounter for follow-up examination after completed treatment for conditions other than malignant neoplasm: Secondary | ICD-10-CM | POA: Diagnosis not present

## 2012-02-15 DIAGNOSIS — K620 Anal polyp: Secondary | ICD-10-CM | POA: Diagnosis not present

## 2012-02-15 DIAGNOSIS — N390 Urinary tract infection, site not specified: Secondary | ICD-10-CM | POA: Diagnosis not present

## 2012-02-15 DIAGNOSIS — Z7982 Long term (current) use of aspirin: Secondary | ICD-10-CM | POA: Diagnosis not present

## 2012-02-15 DIAGNOSIS — K5900 Constipation, unspecified: Secondary | ICD-10-CM | POA: Diagnosis not present

## 2012-02-15 DIAGNOSIS — D129 Benign neoplasm of anus and anal canal: Secondary | ICD-10-CM | POA: Diagnosis not present

## 2012-02-15 DIAGNOSIS — K621 Rectal polyp: Secondary | ICD-10-CM | POA: Diagnosis not present

## 2012-02-15 DIAGNOSIS — Z800 Family history of malignant neoplasm of digestive organs: Secondary | ICD-10-CM | POA: Diagnosis not present

## 2012-02-15 DIAGNOSIS — D126 Benign neoplasm of colon, unspecified: Secondary | ICD-10-CM | POA: Diagnosis not present

## 2012-02-15 DIAGNOSIS — K625 Hemorrhage of anus and rectum: Secondary | ICD-10-CM | POA: Diagnosis not present

## 2012-02-15 DIAGNOSIS — K649 Unspecified hemorrhoids: Secondary | ICD-10-CM | POA: Diagnosis not present

## 2012-02-15 DIAGNOSIS — Z86010 Personal history of colonic polyps: Secondary | ICD-10-CM | POA: Diagnosis not present

## 2012-02-15 DIAGNOSIS — Z9089 Acquired absence of other organs: Secondary | ICD-10-CM | POA: Diagnosis not present

## 2012-02-15 DIAGNOSIS — R12 Heartburn: Secondary | ICD-10-CM | POA: Diagnosis not present

## 2012-02-15 LAB — HM COLONOSCOPY

## 2012-02-16 LAB — PATHOLOGY REPORT

## 2012-02-23 ENCOUNTER — Encounter: Payer: Self-pay | Admitting: Family Medicine

## 2012-04-12 ENCOUNTER — Ambulatory Visit (INDEPENDENT_AMBULATORY_CARE_PROVIDER_SITE_OTHER): Payer: Medicare Other | Admitting: Family Medicine

## 2012-04-12 ENCOUNTER — Encounter: Payer: Self-pay | Admitting: Family Medicine

## 2012-04-12 VITALS — BP 110/80 | HR 77 | Temp 98.2°F | Ht 67.0 in | Wt 188.4 lb

## 2012-04-12 DIAGNOSIS — R300 Dysuria: Secondary | ICD-10-CM

## 2012-04-12 DIAGNOSIS — N390 Urinary tract infection, site not specified: Secondary | ICD-10-CM

## 2012-04-12 DIAGNOSIS — N139 Obstructive and reflux uropathy, unspecified: Secondary | ICD-10-CM

## 2012-04-12 DIAGNOSIS — N320 Bladder-neck obstruction: Secondary | ICD-10-CM

## 2012-04-12 LAB — POCT URINALYSIS DIPSTICK
Bilirubin, UA: NEGATIVE
Glucose, UA: NEGATIVE
Ketones, UA: NEGATIVE
Nitrite, UA: POSITIVE

## 2012-04-12 MED ORDER — CIPROFLOXACIN HCL 500 MG PO TABS: 500.0000 mg | ORAL_TABLET | Freq: Two times a day (BID) | ORAL | Status: AC

## 2012-04-12 NOTE — Progress Notes (Signed)
  Subjective:    Patient ID: Vanessa Jones, female    DOB: May 20, 1941, 71 y.o.   MRN: [id number]  Urinary Tract Infection  This is a new problem. The current episode started more than 1 month ago (Seen by Dr. Patsy Lager for UTI and bladder outflow issues in 08/2011, got better with antibiotics then returned.). The problem occurs every urination. The problem has been gradually worsening. The quality of the pain is described as burning (at beginnng of stream). The patient is experiencing no pain. There has been no fever. She is not sexually active. There is no history of pyelonephritis. Associated symptoms include frequency and hesitancy. Pertinent negatives include no chills, discharge, nausea, possible pregnancy, sweats or vomiting. Associated symptoms comments: Low back pain. Odor to urine Describes flow as a dancing fountain, gets on her legs.. She has tried antibiotics for the symptoms. Her past medical history is significant for recurrent UTIs and a urological procedure. There is no history of kidney stones. past similar, saw uro.Marland Kitchen after cath "dancing fountain " went away      Review of Systems  Constitutional: Negative for chills.  Gastrointestinal: Negative for nausea and vomiting.  Genitourinary: Positive for hesitancy and frequency.       Objective:   Physical Exam  Constitutional: She appears well-developed and well-nourished.  Neck: Normal range of motion. Neck supple. No thyromegaly present.  Cardiovascular: Normal rate, regular rhythm and normal heart sounds.   No murmur heard. Pulmonary/Chest: Effort normal and breath sounds normal. No respiratory distress.  Abdominal: Soft. Bowel sounds are normal. She exhibits no distension and no mass. There is no tenderness. There is no rebound and no guarding.       No CVA tenderness  Neurological: She is alert.          Assessment & Plan:

## 2012-04-12 NOTE — Patient Instructions (Signed)
Stop at front desk for referral to Urology. We will call with culture results. Push fluids. Complete course of antibiotics.

## 2012-04-12 NOTE — Assessment & Plan Note (Signed)
Refer to urology for eval of change in urine flow and recurrent UTI x 2 in last 6 months.

## 2012-04-12 NOTE — Assessment & Plan Note (Signed)
Likely returned  Due to structural issue.  Send for culture . Treat with cipro x 7 days as complicated lower UTI.

## 2012-04-14 LAB — URINE CULTURE
Colony Count: NO GROWTH
Organism ID, Bacteria: NO GROWTH

## 2012-04-28 DIAGNOSIS — M5137 Other intervertebral disc degeneration, lumbosacral region: Secondary | ICD-10-CM | POA: Diagnosis not present

## 2012-04-28 DIAGNOSIS — M5430 Sciatica, unspecified side: Secondary | ICD-10-CM | POA: Diagnosis not present

## 2012-04-28 DIAGNOSIS — M999 Biomechanical lesion, unspecified: Secondary | ICD-10-CM | POA: Diagnosis not present

## 2012-05-20 ENCOUNTER — Encounter: Payer: Self-pay | Admitting: Family Medicine

## 2012-05-24 ENCOUNTER — Telehealth: Payer: Self-pay

## 2012-05-24 NOTE — Telephone Encounter (Signed)
David with [employer] has received records that echo was done 2011, but has charges for 2 echos done same DOS. Gave Annabelle's # in billing.

## 2012-05-31 ENCOUNTER — Other Ambulatory Visit: Payer: Self-pay | Admitting: Family Medicine

## 2012-07-14 ENCOUNTER — Other Ambulatory Visit: Payer: Self-pay | Admitting: Family Medicine

## 2012-07-15 ENCOUNTER — Telehealth: Payer: Self-pay | Admitting: Family Medicine

## 2012-07-15 DIAGNOSIS — E785 Hyperlipidemia, unspecified: Secondary | ICD-10-CM

## 2012-07-15 DIAGNOSIS — M8580 Other specified disorders of bone density and structure, unspecified site: Secondary | ICD-10-CM

## 2012-07-15 NOTE — Telephone Encounter (Signed)
Message copied by Excell Seltzer on Fri Jul 15, 2012 12:18 PM ------      Message from: [name]      Created: Thu Jul 14, 2012  3:24 PM      Regarding: Lab orders for , 8.19.13       Patient is scheduled for CPX labs, please order future labs, Thanks , Camelia Eng

## 2012-07-18 ENCOUNTER — Other Ambulatory Visit (INDEPENDENT_AMBULATORY_CARE_PROVIDER_SITE_OTHER): Payer: Medicare Other

## 2012-07-18 DIAGNOSIS — E785 Hyperlipidemia, unspecified: Secondary | ICD-10-CM

## 2012-07-18 DIAGNOSIS — M8580 Other specified disorders of bone density and structure, unspecified site: Secondary | ICD-10-CM

## 2012-07-18 DIAGNOSIS — M949 Disorder of cartilage, unspecified: Secondary | ICD-10-CM | POA: Diagnosis not present

## 2012-07-18 LAB — LIPID PANEL
LDL Cholesterol: 82 mg/dL (ref 0–99)
Total CHOL/HDL Ratio: 3
VLDL: 16.6 mg/dL (ref 0.0–40.0)

## 2012-07-18 LAB — COMPREHENSIVE METABOLIC PANEL
AST: 19 U/L (ref 0–37)
Albumin: 3.9 g/dL (ref 3.5–5.2)
Alkaline Phosphatase: 76 U/L (ref 39–117)
Potassium: 4.1 mEq/L (ref 3.5–5.1)
Sodium: 139 mEq/L (ref 135–145)
Total Bilirubin: 0.3 mg/dL (ref 0.3–1.2)
Total Protein: 6.7 g/dL (ref 6.0–8.3)

## 2012-07-25 ENCOUNTER — Telehealth: Payer: Self-pay | Admitting: Family Medicine

## 2012-07-25 ENCOUNTER — Ambulatory Visit (INDEPENDENT_AMBULATORY_CARE_PROVIDER_SITE_OTHER): Payer: Medicare Other | Admitting: Family Medicine

## 2012-07-25 ENCOUNTER — Encounter: Payer: Self-pay | Admitting: Family Medicine

## 2012-07-25 VITALS — BP 118/78 | HR 85 | Temp 97.9°F | Ht 67.0 in | Wt 177.0 lb

## 2012-07-25 DIAGNOSIS — E785 Hyperlipidemia, unspecified: Secondary | ICD-10-CM

## 2012-07-25 DIAGNOSIS — M8580 Other specified disorders of bone density and structure, unspecified site: Secondary | ICD-10-CM

## 2012-07-25 DIAGNOSIS — Z0000 Encounter for general adult medical examination without abnormal findings: Secondary | ICD-10-CM

## 2012-07-25 DIAGNOSIS — I10 Essential (primary) hypertension: Secondary | ICD-10-CM

## 2012-07-25 DIAGNOSIS — Z1231 Encounter for screening mammogram for malignant neoplasm of breast: Secondary | ICD-10-CM

## 2012-07-25 MED ORDER — ATORVASTATIN CALCIUM 10 MG PO TABS: 10.0000 mg | ORAL_TABLET | Freq: Every day | ORAL | Status: DC

## 2012-07-25 NOTE — Assessment & Plan Note (Signed)
Well controlled. Continue current medication.  

## 2012-07-25 NOTE — Progress Notes (Signed)
I have personally reviewed the Medicare Annual Wellness questionnaire and have noted  1. The patient's medical and social history  2. Their use of alcohol, tobacco or illicit drugs  3. Their current medications and supplements  4. The patient's functional ability including ADL's, fall risks, home safety risks and hearing or visual  impairment.  5. Diet and physical activities  6. Evidence for depression or mood disorders  The patients weight, height, BMI and visual acuity have been recorded in the chart  I have made referrals, counseling and provided education to the patient based review of the above and I have provided the pt with a written personalized care plan for preventive services.   Saw Dr. Mariah Milling in last year for discussion of ECHo... Felt benign findings.  Of note there was an error done and there are two ECHOs under this pt's name... The normal one is her ECHO, the other one, abnormal is from the wrong patient and is an error. Dyspnea occ ongoing, but minimallly. O2 Sat 97%  Feels due to weight and deconditioning.  Walking twice a week.  Eating regularly but moderate fat and carb intake. Not always good, sausage biscuit this AM.   Elevated Cholesterol: On lipitor 10 mg daily. Lab Results  Component Value Date   CHOL 154 07/18/2012   HDL 55.30 07/18/2012   LDLCALC 82 07/18/2012   TRIG 83.0 07/18/2012   CHOLHDL 3 07/18/2012    Using medications without problems:None  Muscle aches: None Other complaints:   Hypertension: Well controlled  on HCTZ and lisinopril Using medication without problems or lightheadedness:  Chest pain with exertion:None  Edema:None  Average home BPs: Not checking.  Other issues:   Decreased flow, urinary issues resolved now with increased exercise. Cancelled uro referral.  Review of Systems  Constitutional: Negative for fever, fatigue and unexpected weight change.  HENT: Negative for congestion and tinnitus.  Eyes: Negative for pain.  Respiratory:  Negative for cough, shortness of breath and wheezing.  Cardiovascular: Negative for chest pain, palpitations and leg swelling.  Gastrointestinal: Negative for abdominal pain, diarrhea, constipation and blood in stool.  Rarely using fiber pills for constipation.  Genitourinary: Negative for dysuria and hematuria.  Neurological: Negative for syncope, weakness and numbness.  No falls.  Notes weakness, stiffness and nodules in hands B, DIP    Objective:   Physical Exam  Constitutional: Vital signs are normal. She appears well-developed and well-nourished. She is cooperative. Non-toxic appearance. She does not appear ill. No distress.  HENT:  Head: Normocephalic.  Right Ear: Hearing, tympanic membrane, external ear and ear canal normal.  Left Ear: Hearing, tympanic membrane, external ear and ear canal normal.  Nose: Nose normal.  Eyes: Conjunctivae, EOM and lids are normal. Pupils are equal, round, and reactive to light. No foreign bodies found.  Neck: Trachea normal and normal range of motion. Neck supple. Carotid bruit is not present. No mass and no thyromegaly present.  Cardiovascular: Normal rate, regular rhythm, S1 normal, S2 normal, normal heart sounds and intact distal pulses. Exam reveals no gallop.  No murmur heard.  Pulmonary/Chest: Effort normal and breath sounds normal. No respiratory distress. She has no wheezes. She has no rhonchi. She has no rales.  Abdominal: Soft. Normal appearance and bowel sounds are normal. She exhibits no distension, no fluid wave, no abdominal bruit and no mass. There is no hepatosplenomegaly. There is no tenderness. There is no rebound, no guarding and no CVA tenderness. No hernia.  Genitourinary: No breast swelling, tenderness,  discharge or bleeding. Pelvic exam was performed with patient prone.  Musculoskeletal:  Swelling , nodules and deformity in B DIP joints in hands No erythema, no PIP, no MCP changes  Lymphadenopathy:  She has no cervical  adenopathy.  She has no axillary adenopathy.  Neurological: She is alert. She has normal strength. No cranial nerve deficit or sensory deficit.  Skin: Skin is warm, dry and intact. No rash noted.  Psychiatric: Her speech is normal and behavior is normal. Judgment normal. Her mood appears not anxious. Cognition and memory are normal. She does not exhibit a depressed mood.      Assessment and Plan:  The patient's preventative maintenance and recommended screening tests for an annual wellness exam were reviewed in full today. Brought up to date unless services declined.  Counselled on the importance of diet, exercise, and its role in overall health and mortality. The patient's FH and SH was reviewed, including their home life, tobacco status, and drug and alcohol status.   Colon: 01/2012 Dr. Marva Panda, [facility], polyps removed, repeat in 3 years Family history of colon cancer in mother PAP/DVE: PAP not indicated due to age. DVE every 2 years (nml last year), no symptoms, no family history of uterine and ovarian cancer. 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

## 2012-07-25 NOTE — Telephone Encounter (Signed)
There are two Echocardiograms filled under this pt's name from 01/2010 on the same day. The normal ECHO is likely the correct one for this individual. The other is an error, incorrectly filed with the wrong name.

## 2012-07-25 NOTE — Assessment & Plan Note (Signed)
Well controlled on current med.

## 2012-07-25 NOTE — Patient Instructions (Addendum)
Can try B12 or B complex vitamin for energy. Work on regular exercise, weight loss and healthy eating. Stop at front to schedule mammogram.

## 2012-07-25 NOTE — Progress Notes (Deleted)
  Subjective:    Patient ID: Vanessa Jones, female    DOB: 1941/03/14, 71 y.o.   MRN: [id number]  HPI    Review of Systems     Objective:   Physical Exam        Assessment & Plan:

## 2012-07-29 DIAGNOSIS — Z1231 Encounter for screening mammogram for malignant neoplasm of breast: Secondary | ICD-10-CM | POA: Diagnosis not present

## 2012-08-03 ENCOUNTER — Encounter: Payer: Self-pay | Admitting: Family Medicine

## 2012-08-03 DIAGNOSIS — H2510 Age-related nuclear cataract, unspecified eye: Secondary | ICD-10-CM | POA: Diagnosis not present

## 2012-08-04 ENCOUNTER — Encounter: Payer: Self-pay | Admitting: Family Medicine

## 2012-08-23 ENCOUNTER — Other Ambulatory Visit: Payer: Self-pay | Admitting: Family Medicine

## 2012-08-25 ENCOUNTER — Other Ambulatory Visit: Payer: Self-pay

## 2012-08-29 ENCOUNTER — Other Ambulatory Visit: Payer: Self-pay | Admitting: Family Medicine

## 2012-09-05 ENCOUNTER — Other Ambulatory Visit: Payer: Self-pay

## 2012-09-05 NOTE — Telephone Encounter (Signed)
Pt out of atorvastatin; was told by [facility] refill denied by our office. Spoke with Delice Bison at [facility] did not receive electronic refill atorvastatin on 07/25/12; given verbal order now. Pt advised while on phone.

## 2013-01-26 ENCOUNTER — Ambulatory Visit (INDEPENDENT_AMBULATORY_CARE_PROVIDER_SITE_OTHER): Payer: Medicare Other | Admitting: Family Medicine

## 2013-01-26 ENCOUNTER — Encounter: Payer: Self-pay | Admitting: Family Medicine

## 2013-01-26 VITALS — BP 130/72 | HR 81 | Temp 98.1°F | Ht 67.0 in | Wt 187.0 lb

## 2013-01-26 DIAGNOSIS — R5383 Other fatigue: Secondary | ICD-10-CM | POA: Diagnosis not present

## 2013-01-26 DIAGNOSIS — R5381 Other malaise: Secondary | ICD-10-CM | POA: Diagnosis not present

## 2013-01-26 DIAGNOSIS — I10 Essential (primary) hypertension: Secondary | ICD-10-CM

## 2013-01-26 DIAGNOSIS — E538 Deficiency of other specified B group vitamins: Secondary | ICD-10-CM | POA: Diagnosis not present

## 2013-01-26 LAB — COMPREHENSIVE METABOLIC PANEL
ALT: 9 U/L (ref 0–35)
Albumin: 4.2 g/dL (ref 3.5–5.2)
CO2: 27 mEq/L (ref 19–32)
Calcium: 9.6 mg/dL (ref 8.4–10.5)
Chloride: 102 mEq/L (ref 96–112)
GFR: 58.31 mL/min — ABNORMAL LOW (ref >60.00)
Potassium: 4.3 mEq/L (ref 3.5–5.1)
Sodium: 137 mEq/L (ref 135–145)
Total Protein: 7.6 g/dL (ref 6.0–8.3)

## 2013-01-26 LAB — CBC WITH DIFFERENTIAL/PLATELET
Eosinophils Relative: 4.5 % (ref 0.0–5.0)
HCT: 40.9 % (ref 36.0–46.0)
Lymphs Abs: 3.5 10*3/uL (ref 0.7–4.0)
Monocytes Relative: 7.3 % (ref 3.0–12.0)
Neutrophils Relative %: 49.4 % (ref 43.0–77.0)
Platelets: 370 10*3/uL (ref 150.0–400.0)
WBC: 9.1 10*3/uL (ref 4.5–10.5)

## 2013-01-26 LAB — VITAMIN B12: Vitamin B-12: 263 pg/mL (ref 211–911)

## 2013-01-26 LAB — TSH: TSH: 1.47 u[IU]/mL (ref 0.35–5.50)

## 2013-01-26 NOTE — Assessment & Plan Note (Signed)
Well controlled. Continue current medication.  

## 2013-01-26 NOTE — Patient Instructions (Signed)
Stop at lab on your way out. Okay to start daily vitamin. Work on regular exercise. Schedule medicare wellness in 6 months with labs prior.

## 2013-01-26 NOTE — Progress Notes (Signed)
  Subjective:    Patient ID: Vanessa Jones, female    DOB: 07/04/1941, 72 y.o.   MRN: [id number]  HPI  72 year old female with HTN, high cholesterol presents for 6 month follow up.   Hypertension:  Well controlled on lisinopril Using medication without problems or lightheadedness: None Chest pain with exertion:None Edema:None Short of breath: occasionally  Average home BPs: at goal. Other issues:  ECHo in past nml.  Has seen  Cardiology in past.   She has noted some fatigue lately.  Sleeping okay at night.. Gets up twice to pee, feels due to drinking tea/coffee close to bedtime. No apenic spells. She does snore some off and on per husband.  She is currently having gum issues ( receding gums, gingivitis).. She is concerned about low calcium  Because teeth/gums are declining.    Review of Systems  Constitutional: Negative for fever and fatigue.  HENT: Negative for ear pain.   Eyes: Negative for pain.  Respiratory: Negative for chest tightness and shortness of breath.   Cardiovascular: Negative for chest pain, palpitations and leg swelling.  Gastrointestinal: Negative for abdominal pain.  Genitourinary: Negative for dysuria.       Objective:   Physical Exam  Constitutional: Vital signs are normal. She appears well-developed and well-nourished. She is cooperative.  Non-toxic appearance. She does not appear ill. No distress.  HENT:  Head: Normocephalic.  Right Ear: Hearing, tympanic membrane, external ear and ear canal normal. Tympanic membrane is not erythematous, not retracted and not bulging.  Left Ear: Hearing, tympanic membrane, external ear and ear canal normal. Tympanic membrane is not erythematous, not retracted and not bulging.  Nose: No mucosal edema or rhinorrhea. Right sinus exhibits no maxillary sinus tenderness and no frontal sinus tenderness. Left sinus exhibits no maxillary sinus tenderness and no frontal sinus tenderness.  Mouth/Throat: Uvula is midline,  oropharynx is clear and moist and mucous membranes are normal.  Eyes: Conjunctivae, EOM and lids are normal. Pupils are equal, round, and reactive to light. No foreign bodies found.  Neck: Trachea normal and normal range of motion. Neck supple. Carotid bruit is not present. No mass and no thyromegaly present.  Cardiovascular: Normal rate, regular rhythm, S1 normal, S2 normal, normal heart sounds, intact distal pulses and normal pulses.  Exam reveals no gallop and no friction rub.   No murmur heard. Pulmonary/Chest: Effort normal and breath sounds normal. Not tachypneic. No respiratory distress. She has no decreased breath sounds. She has no wheezes. She has no rhonchi. She has no rales.  Abdominal: Soft. Normal appearance and bowel sounds are normal. There is no tenderness.  Neurological: She is alert.  Skin: Skin is warm, dry and intact. No rash noted.  Psychiatric: Her speech is normal and behavior is normal. Judgment and thought content normal. Her mood appears not anxious. Cognition and memory are normal. She does not exhibit a depressed mood.          Assessment & Plan:

## 2013-01-26 NOTE — Assessment & Plan Note (Signed)
Will eval with labs. Vit D nml 6 months ago.  Consider sleep study if labs return nml for further eval.

## 2013-01-27 ENCOUNTER — Encounter: Payer: Self-pay | Admitting: *Deleted

## 2013-03-08 ENCOUNTER — Ambulatory Visit (INDEPENDENT_AMBULATORY_CARE_PROVIDER_SITE_OTHER): Payer: Medicare Other | Admitting: Family Medicine

## 2013-03-08 ENCOUNTER — Encounter: Payer: Self-pay | Admitting: Family Medicine

## 2013-03-08 VITALS — BP 120/76 | HR 77 | Temp 98.2°F | Ht 67.0 in | Wt 191.5 lb

## 2013-03-08 DIAGNOSIS — J0100 Acute maxillary sinusitis, unspecified: Secondary | ICD-10-CM

## 2013-03-08 MED ORDER — AMOXICILLIN 500 MG PO CAPS: 1000.0000 mg | ORAL_CAPSULE | Freq: Two times a day (BID) | ORAL | Status: DC

## 2013-03-08 NOTE — Progress Notes (Signed)
[facility] [address] [place] [address] Phone: [phone] Fax: [phone]  Date:  03/08/2013   Name:  Vanessa Jones   DOB:  July 28, 1941   MRN:  [id number] Gender: female Age: 72 y.o.  Primary Physician:  Kerby Nora, MD  Evaluating MD: Hannah Beat, MD   Chief Complaint: URI   History of Present Illness:  Vanessa Jones is a 72 y.o. pleasant patient who presents with the following:  Has a bad cold and congestion for about 8 days, has decreased hearing. Coughing spells are pretty bad,no fever, and started out with nyquil. Could not spit it out fast enough. She really doesn't feel well and is frustrated. She feels as if her over-the-counter medications have not been helping very much. She would like to have something that would make her feel better. She has some mild sinus tenderness. No significant earache. No sore throat, nausea, vomiting, or diarrhea.    Patient Active Problem List  Diagnosis  . HYPERLIPIDEMIA  . HYPERTENSION  . ALLERGIC RHINITIS  . DERMATITIS, ALLERGIC  . OSTEOARTHRITIS  . Osteopenia  . Bladder outflow obstruction  . Other malaise and fatigue    Past Medical History  Diagnosis Date  . Hypertension   . Hyperlipidemia   . Acute back pain   . Osteoarthritis   . Allergic dermatitis   . Allergic rhinitis     Past Surgical History  Procedure Laterality Date  . Cholecystectomy    . Hemorrhoid surgery      History   Social History  . Marital Status: Married    Spouse Name: N/A    Number of Children: 2  . Years of Education: N/A   Occupational History  . Librarian, academic, retired    Social History Main Topics  . Smoking status: Never Smoker   . Smokeless tobacco: Never Used  . Alcohol Use: Yes     Comment: Occ glass of wine  . Drug Use: No  . Sexually Active: Not on file   Other Topics Concern  . Not on file   Social History Narrative   Retired Librarian, academic   Married, 2 healthy boys      Regular exercise- yes, walking 3 days a week.   2006 treadmill stress test, low risk.      Moderate diet   No HCOPA, no living will, info given (reviewed 2013)    Family History  Problem Relation Age of Onset  . Hypertension Mother   . Cancer Mother     colon, age 82  . Diabetes Father   . Emphysema Father   . Cancer Sister     breast  . Cancer Brother     bladder  . Coronary artery disease Brother   . Coronary artery disease Maternal Grandmother   . Heart disease Paternal Grandmother     enlarged heart  . Stroke Paternal Grandfather     No Known Allergies  Medication list has been reviewed and updated.  Outpatient Prescriptions Prior to Visit  Medication Sig Dispense Refill  . aspirin 81 MG tablet Take 81 mg by mouth daily.        Marland Kitchen atorvastatin (LIPITOR) 10 MG tablet Take 1 tablet (10 mg total) by mouth daily.  90 tablet  3  . hydrochlorothiazide (HYDRODIURIL) 25 MG tablet TAKE ONE-HALF TABLET BY MOUTH EVERY DAY  30 tablet  6  . hydrocortisone (ANUSOL-HC) 2.5 % rectal cream Place 1 application rectally 2 (two) times  daily.        . lisinopril (PRINIVIL,ZESTRIL) 10 MG tablet TAKE 1 TABLET BY MOUTH EVERY DAY  90 tablet  3   No facility-administered medications prior to visit.    Review of Systems:  ROS: GEN: Acute illness details above GI: Tolerating PO intake GU: maintaining adequate hydration and urination Pulm: No SOB Interactive and getting along well at home.  Otherwise, ROS is as per the HPI.   Physical Examination: BP 120/76  Pulse 77  Temp(Src) 98.2 F (36.8 C) (Oral)  Ht 5\' 7"  (1.702 m)  Wt 191 lb 8 oz (86.864 kg)  BMI 29.99 kg/m2  SpO2 94%  Ideal Body Weight: Weight in (lb) to have BMI = 25: 159.3   Gen: WDWN, NAD; alert,appropriate and cooperative throughout exam  HEENT: Normocephalic and atraumatic. Throat clear, w/o exudate, no LAD, R TM clear, L TM - good landmarks, No fluid present. rhinnorhea.  Left frontal and maxillary sinuses:  Tender, mild Right frontal and maxillary sinuses: Tender, mild  Neck: No ant or post LAD CV: RRR, No M/G/R Pulm: Breathing comfortably in no resp distress. no w/c/r Abd: S,NT,ND,+BS Extr: no c/c/e Psych: full affect, pleasant   Assessment and Plan:  Sinusitis, acute maxillary  URI versus early sinusitis. Continue with supportive care. Start high-dose guaifenesin. High-dose amoxicillin.  Orders Today:  No orders of the defined types were placed in this encounter.    Updated Medication List: (Includes new medications, updates to list, dose adjustments) Meds ordered this encounter  Medications  . amoxicillin (AMOXIL) 500 MG capsule    Sig: Take 2 capsules (1,000 mg total) by mouth 2 (two) times daily.    Dispense:  40 capsule    Refill:  0    Medications Discontinued: There are no discontinued medications.    Signed, Elpidio Galea. Tanish Prien, MD 03/08/2013 11:52 AM

## 2013-03-08 NOTE — Patient Instructions (Addendum)
Take Guaifenesin (400mg), take 11/2 tabs by mouth AM and NOON. Get GUAIFENESIN by  going to [facility] or [facility] and getting MUCOUS RELIEF EXPECTORANT/CONGESTION. DO NOT GET MUCINEX (Timed Release Guaifenesin)  

## 2013-03-22 ENCOUNTER — Telehealth: Payer: Self-pay | Admitting: Family Medicine

## 2013-03-22 MED ORDER — AMOXICILLIN-POT CLAVULANATE 875-125 MG PO TABS: 1.0000 | ORAL_TABLET | Freq: Two times a day (BID) | ORAL | Status: DC

## 2013-03-22 NOTE — Telephone Encounter (Signed)
i would broaden to augmentin 875 mg, 1 po bid, #20

## 2013-03-22 NOTE — Telephone Encounter (Signed)
Patient advised and rx sent to pharmacy  

## 2013-03-22 NOTE — Telephone Encounter (Signed)
Caller: Richard/Spouse; Phone: [phone]; Reason for Call: Patient was taking Amoxicillin for nasal congestion, sinus pressure, fluid on ears.  Patient reports the symptoms improved with the medication, but she is still having symptoms presently.  She wants to get a refill of Amoxicillin 500mg  po 2 tablets BID.  She took the medication for 10 days.  Afebrile.  Reports symptoms are the same today as when she saw Dr.  Patsy Lager before.  Please contact patient regarding her request.

## 2013-07-17 ENCOUNTER — Other Ambulatory Visit: Payer: Self-pay | Admitting: *Deleted

## 2013-07-17 MED ORDER — HYDROCHLOROTHIAZIDE 25 MG PO TABS: ORAL_TABLET | ORAL | Status: DC

## 2013-07-17 NOTE — Telephone Encounter (Signed)
Received faxed refill request from pharmacy. Refill sent to pharmacy electronically. 

## 2013-07-18 ENCOUNTER — Telehealth: Payer: Self-pay | Admitting: Family Medicine

## 2013-07-18 DIAGNOSIS — M8580 Other specified disorders of bone density and structure, unspecified site: Secondary | ICD-10-CM

## 2013-07-18 DIAGNOSIS — E785 Hyperlipidemia, unspecified: Secondary | ICD-10-CM

## 2013-07-18 NOTE — Telephone Encounter (Signed)
Message copied by Excell Seltzer on Tue Jul 18, 2013  5:23 PM ------      Message from: [name]      Created: Thu Jul 13, 2013  2:58 PM      Regarding: Lab orders for Wednesday, 8.20.14       Patient is scheduled for CPX labs, please order future labs, Thanks , Terri       ------

## 2013-07-19 ENCOUNTER — Other Ambulatory Visit (INDEPENDENT_AMBULATORY_CARE_PROVIDER_SITE_OTHER): Payer: Medicare Other

## 2013-07-19 DIAGNOSIS — R5381 Other malaise: Secondary | ICD-10-CM | POA: Diagnosis not present

## 2013-07-19 DIAGNOSIS — E785 Hyperlipidemia, unspecified: Secondary | ICD-10-CM | POA: Diagnosis not present

## 2013-07-19 DIAGNOSIS — I10 Essential (primary) hypertension: Secondary | ICD-10-CM

## 2013-07-19 DIAGNOSIS — M1990 Unspecified osteoarthritis, unspecified site: Secondary | ICD-10-CM | POA: Diagnosis not present

## 2013-07-19 DIAGNOSIS — M899 Disorder of bone, unspecified: Secondary | ICD-10-CM | POA: Diagnosis not present

## 2013-07-19 DIAGNOSIS — M8580 Other specified disorders of bone density and structure, unspecified site: Secondary | ICD-10-CM

## 2013-07-19 LAB — COMPREHENSIVE METABOLIC PANEL
Albumin: 4 g/dL (ref 3.5–5.2)
Alkaline Phosphatase: 74 U/L (ref 39–117)
BUN: 13 mg/dL (ref 6–23)
CO2: 28 mEq/L (ref 19–32)
Calcium: 9.2 mg/dL (ref 8.4–10.5)
Chloride: 98 mEq/L (ref 96–112)
GFR: 63.32 mL/min (ref >60.00)
Glucose, Bld: 92 mg/dL (ref 70–99)
Potassium: 3.8 mEq/L (ref 3.5–5.1)
Sodium: 133 mEq/L — ABNORMAL LOW (ref 135–145)
Total Protein: 7.3 g/dL (ref 6.0–8.3)

## 2013-07-19 LAB — LIPID PANEL
Cholesterol: 168 mg/dL (ref 0–200)
Triglycerides: 128 mg/dL (ref 0.0–149.0)

## 2013-07-27 ENCOUNTER — Ambulatory Visit (INDEPENDENT_AMBULATORY_CARE_PROVIDER_SITE_OTHER): Payer: Medicare Other | Admitting: Family Medicine

## 2013-07-27 ENCOUNTER — Encounter: Payer: Self-pay | Admitting: Family Medicine

## 2013-07-27 VITALS — BP 112/80 | HR 75 | Temp 98.0°F | Ht 65.0 in | Wt 190.0 lb

## 2013-07-27 DIAGNOSIS — L02219 Cutaneous abscess of trunk, unspecified: Secondary | ICD-10-CM

## 2013-07-27 DIAGNOSIS — R0600 Dyspnea, unspecified: Secondary | ICD-10-CM

## 2013-07-27 DIAGNOSIS — E871 Hypo-osmolality and hyponatremia: Secondary | ICD-10-CM | POA: Diagnosis not present

## 2013-07-27 DIAGNOSIS — M899 Disorder of bone, unspecified: Secondary | ICD-10-CM

## 2013-07-27 DIAGNOSIS — M8580 Other specified disorders of bone density and structure, unspecified site: Secondary | ICD-10-CM

## 2013-07-27 DIAGNOSIS — Z0000 Encounter for general adult medical examination without abnormal findings: Secondary | ICD-10-CM | POA: Diagnosis not present

## 2013-07-27 DIAGNOSIS — E785 Hyperlipidemia, unspecified: Secondary | ICD-10-CM

## 2013-07-27 DIAGNOSIS — Z1231 Encounter for screening mammogram for malignant neoplasm of breast: Secondary | ICD-10-CM

## 2013-07-27 DIAGNOSIS — I10 Essential (primary) hypertension: Secondary | ICD-10-CM

## 2013-07-27 DIAGNOSIS — R0609 Other forms of dyspnea: Secondary | ICD-10-CM

## 2013-07-27 DIAGNOSIS — L03314 Cellulitis of groin: Secondary | ICD-10-CM

## 2013-07-27 NOTE — Patient Instructions (Addendum)
Do not avoid salt in particular. Return in 2 weeks for a lab check of sodium. Stop at the front desk to set up Bone density and mammogram, as well as ECHO. Resolved infection of skin at right groin.. Can use warm compresses and neosporin if recurs.

## 2013-07-27 NOTE — Assessment & Plan Note (Signed)
Well controlled. Continue current medication.  

## 2013-07-27 NOTE — Progress Notes (Signed)
I have personally reviewed the Medicare Annual Wellness questionnaire and have noted  1. The patient's medical and social history  2. Their use of alcohol, tobacco or illicit drugs  3. Their current medications and supplements  4. The patient's functional ability including ADL's, fall risks, home safety risks and hearing or visual  impairment.  5. Diet and physical activities  6. Evidence for depression or mood disorders  The patients weight, height, BMI and visual acuity have been recorded in the chart  I have made referrals, counseling and provided education to the patient based review of the above and I have provided the pt with a written personalized care plan for preventive services.   Walking twice a week.   Eating regularly but moderate fat and carb intake.  Elevated Cholesterol:  At goal on lipitor 10 mg daily.  Lab Results  Component Value Date   CHOL 168 07/19/2013   HDL 52.00 07/19/2013   LDLCALC 90 07/19/2013   TRIG 128.0 07/19/2013   CHOLHDL 3 07/19/2013   Wt Readings from Last 3 Encounters:  07/27/13 190 lb (86.183 kg)  03/08/13 191 lb 8 oz (86.864 kg)  01/26/13 187 lb (84.823 kg)  Using medications without problems:None  Muscle aches: None  Other complaints:   Hypertension: Well controlled on HCTZ and lisinopril  BP Readings from Last 3 Encounters:  07/27/13 112/80  03/08/13 120/76  01/26/13 130/72  Using medication without problems or lightheadedness: none Chest pain with exertion:None  Edema:None  She continues to be slightly dyspneic but not any worse. Average home BPs: Not checking.  Other issues:   She has noted a ? Boil, irritated area on right inner thigh at panty line after wearing jeans. No clear pustule, but was red.  Has been doing warm compresses, applying monistat chafing gels.  Area has improved, but now has second smaller area.  No change in detergent.  Has changed afterward to boxer shorts.  Volunteering at hospice.  Review of Systems   Constitutional: Negative for fever, fatigue and unexpected weight change.  HENT: Negative for congestion and tinnitus.  Eyes: Negative for pain.  Respiratory: Negative for cough, shortness of breath and wheezing.  Cardiovascular: Negative for chest pain, palpitations and leg swelling.  Gastrointestinal: Negative for abdominal pain, diarrhea, constipation and blood in stool.  Genitourinary: Negative for dysuria and hematuria.  Neurological: Negative for syncope, weakness and numbness.  No falls.  Objective:   Physical Exam  Constitutional: Vital signs are normal. She appears well-developed and well-nourished. She is cooperative. Non-toxic appearance. She does not appear ill. No distress.  HENT:  Head: Normocephalic.  Right Ear: Hearing, tympanic membrane, external ear and ear canal normal.  Left Ear: Hearing, tympanic membrane, external ear and ear canal normal.  Nose: Nose normal.  Eyes: Conjunctivae, EOM and lids are normal. Pupils are equal, round, and reactive to light. No foreign bodies found.  Neck: Trachea normal and normal range of motion. Neck supple. Carotid bruit is not present. No mass and no thyromegaly present.  Cardiovascular: Normal rate, regular rhythm, S1 normal, S2 normal, normal heart sounds and intact distal pulses. Exam reveals no gallop.  No murmur heard.  Pulmonary/Chest: Effort normal and breath sounds normal. No respiratory distress. She has no wheezes. She has no rhonchi. She has no rales.  Abdominal: Soft. Normal appearance and bowel sounds are normal. She exhibits no distension, no fluid wave, no abdominal bruit and no mass. There is no hepatosplenomegaly. There is no tenderness. There is no rebound,  no guarding and no CVA tenderness. No hernia.  Genitourinary: No breast swelling, tenderness, discharge or bleeding. Pelvic exam was performed with patient prone.  Musculoskeletal:  Swelling , nodules and deformity in B DIP joints in hands No erythema, no PIP, no  MCP changes  Lymphadenopathy:  She has no cervical adenopathy.  She has no axillary adenopathy.  Neurological: She is alert. She has normal strength. No cranial nerve deficit or sensory deficit.  Skin: Skin is warm, dry and intact. No rash noted.  Psychiatric: Her speech is normal and behavior is normal. Judgment normal. Her mood appears not anxious. Cognition and memory are normal. She does not exhibit a depressed mood.    Assessment and Plan:  The patient's preventative maintenance and recommended screening tests for an annual wellness exam were reviewed in full today.  Brought up to date unless services declined.  Counselled on the importance of diet, exercise, and its role in overall health and mortality.  The patient's FH and SH was reviewed, including their home life, tobacco status, and drug and alcohol status .  Colon: 01/2012 Dr. Marva Panda, [facility], polyps removed, repeat in 3 years.  Family history of colon cancer in mother  PAP/DVE: PAP not indicated due to age. DVE every 2 years (nml 2012), no symptoms, no family history of uterine and ovarian cancer.  DEXA: 05/14/2011 spine osteopenia, unchanged, repeat in 2014  Mammogram: Last 05/2011, will schedule  Vaccines: Td and PNA shingles vaccine uptodate.

## 2013-07-27 NOTE — Assessment & Plan Note (Signed)
Boil, resolved. If recurs.. Can use topical antibiotic  and warm compresses.

## 2013-07-27 NOTE — Assessment & Plan Note (Signed)
Will check this year, she has noted she has shrunk.

## 2013-07-27 NOTE — Assessment & Plan Note (Signed)
Likely due to low salt diet and HCTZ. Stop salt restriction and recheck in 2 weeks. May need to stop HCTZ.

## 2013-08-02 ENCOUNTER — Ambulatory Visit: Payer: Self-pay | Admitting: Family Medicine

## 2013-08-02 DIAGNOSIS — I509 Heart failure, unspecified: Secondary | ICD-10-CM | POA: Diagnosis not present

## 2013-08-02 DIAGNOSIS — I059 Rheumatic mitral valve disease, unspecified: Secondary | ICD-10-CM | POA: Diagnosis not present

## 2013-08-02 DIAGNOSIS — R0609 Other forms of dyspnea: Secondary | ICD-10-CM | POA: Diagnosis not present

## 2013-08-03 ENCOUNTER — Encounter: Payer: Self-pay | Admitting: Family Medicine

## 2013-08-03 DIAGNOSIS — M899 Disorder of bone, unspecified: Secondary | ICD-10-CM | POA: Diagnosis not present

## 2013-08-03 DIAGNOSIS — Z1231 Encounter for screening mammogram for malignant neoplasm of breast: Secondary | ICD-10-CM | POA: Diagnosis not present

## 2013-08-03 DIAGNOSIS — Z780 Asymptomatic menopausal state: Secondary | ICD-10-CM | POA: Diagnosis not present

## 2013-08-07 ENCOUNTER — Encounter: Payer: Self-pay | Admitting: Family Medicine

## 2013-08-08 ENCOUNTER — Encounter: Payer: Self-pay | Admitting: *Deleted

## 2013-08-10 ENCOUNTER — Other Ambulatory Visit (INDEPENDENT_AMBULATORY_CARE_PROVIDER_SITE_OTHER): Payer: Medicare Other

## 2013-08-10 ENCOUNTER — Encounter: Payer: Self-pay | Admitting: *Deleted

## 2013-08-10 DIAGNOSIS — I10 Essential (primary) hypertension: Secondary | ICD-10-CM

## 2013-08-10 DIAGNOSIS — E871 Hypo-osmolality and hyponatremia: Secondary | ICD-10-CM

## 2013-08-10 LAB — BASIC METABOLIC PANEL
CO2: 26 mEq/L (ref 19–32)
Calcium: 9.3 mg/dL (ref 8.4–10.5)
Chloride: 105 mEq/L (ref 96–112)
Glucose, Bld: 91 mg/dL (ref 70–99)
Potassium: 4 mEq/L (ref 3.5–5.1)
Sodium: 137 mEq/L (ref 135–145)

## 2013-09-20 DIAGNOSIS — Z23 Encounter for immunization: Secondary | ICD-10-CM | POA: Diagnosis not present

## 2013-10-05 ENCOUNTER — Other Ambulatory Visit: Payer: Self-pay | Admitting: *Deleted

## 2013-10-05 MED ORDER — ATORVASTATIN CALCIUM 10 MG PO TABS: 10.0000 mg | ORAL_TABLET | Freq: Every day | ORAL | Status: DC

## 2013-11-16 ENCOUNTER — Encounter: Payer: Self-pay | Admitting: Family Medicine

## 2013-11-16 ENCOUNTER — Ambulatory Visit (INDEPENDENT_AMBULATORY_CARE_PROVIDER_SITE_OTHER): Payer: Medicare Other | Admitting: Family Medicine

## 2013-11-16 ENCOUNTER — Telehealth: Payer: Self-pay

## 2013-11-16 VITALS — BP 120/76 | HR 77 | Temp 98.1°F | Ht 65.0 in | Wt 186.5 lb

## 2013-11-16 DIAGNOSIS — H6980 Other specified disorders of Eustachian tube, unspecified ear: Secondary | ICD-10-CM | POA: Insufficient documentation

## 2013-11-16 DIAGNOSIS — H6990 Unspecified Eustachian tube disorder, unspecified ear: Secondary | ICD-10-CM | POA: Insufficient documentation

## 2013-11-16 DIAGNOSIS — H6983 Other specified disorders of Eustachian tube, bilateral: Secondary | ICD-10-CM

## 2013-11-16 MED ORDER — FLUTICASONE PROPIONATE 50 MCG/ACT NA SUSP: 2.0000 | Freq: Every day | NASAL | Status: DC

## 2013-11-16 NOTE — Patient Instructions (Addendum)
Start zyrtec at bedtime.  Start nasal saline spray OTC increase 2-3 time a day.  Start nasal steroid spray 2 spray per nostril daily. Call if fever, ear pain, shortness of breath.

## 2013-11-16 NOTE — Assessment & Plan Note (Signed)
Treat with nasal saline, antihistamine, and nasal steriod.

## 2013-11-16 NOTE — Telephone Encounter (Signed)
Pt seen today and AVS pt thought had pt as previous smoker; advised pt what I see has never smoked. Pt said that is correct. Under social history of pts chart listed as never smoked. Pt said OK.

## 2013-11-16 NOTE — Progress Notes (Signed)
Pre-visit discussion using our clinic review tool. No additional management support is needed unless otherwise documented below in the visit note.  

## 2013-11-16 NOTE — Progress Notes (Signed)
   Subjective:    Patient ID: Vanessa Jones, female    DOB: 09-Jun-1941, 72 y.o.   MRN: [id number]  Sinusitis This is a new problem. The current episode started in the past 7 days. The problem has been gradually worsening since onset. There has been no fever. She is experiencing no pain. Associated symptoms include congestion, coughing and sneezing. Pertinent negatives include no ear pain, headaches, shortness of breath, sinus pressure or sore throat. (Now continuously ringing in B ears, when swallows ears pop, post nasal drip  rare dry cough) Treatments tried: nasal saline spray oncve daily. The treatment provided mild relief.      Review of Systems  HENT: Positive for congestion and sneezing. Negative for ear pain, sinus pressure and sore throat.   Respiratory: Positive for cough. Negative for shortness of breath.   Neurological: Negative for headaches.       Objective:   Physical Exam  Constitutional: Vital signs are normal. She appears well-developed and well-nourished. She is cooperative.  Non-toxic appearance. She does not appear ill. No distress.  HENT:  Head: Normocephalic.  Right Ear: Hearing, external ear and ear canal normal. Tympanic membrane is not erythematous, not retracted and not bulging. A middle ear effusion is present.  Left Ear: Hearing, external ear and ear canal normal. Tympanic membrane is not erythematous, not retracted and not bulging. A middle ear effusion is present.  Nose: Mucosal edema present. No rhinorrhea. Right sinus exhibits no maxillary sinus tenderness and no frontal sinus tenderness. Left sinus exhibits no maxillary sinus tenderness and no frontal sinus tenderness.  Mouth/Throat: Uvula is midline, oropharynx is clear and moist and mucous membranes are normal.  Eyes: Conjunctivae, EOM and lids are normal. Pupils are equal, round, and reactive to light. Lids are everted and swept, no foreign bodies found.  Neck: Trachea normal and normal range of  motion. Neck supple. Carotid bruit is not present. No mass and no thyromegaly present.  Cardiovascular: Normal rate, regular rhythm, S1 normal, S2 normal, normal heart sounds, intact distal pulses and normal pulses.  Exam reveals no gallop and no friction rub.   No murmur heard. Pulmonary/Chest: Effort normal and breath sounds normal. Not tachypneic. No respiratory distress. She has no decreased breath sounds. She has no wheezes. She has no rhonchi. She has no rales.  Neurological: She is alert.  Skin: Skin is warm, dry and intact. No rash noted.  Psychiatric: Her speech is normal and behavior is normal. Judgment normal. Her mood appears not anxious. Cognition and memory are normal. She does not exhibit a depressed mood.          Assessment & Plan:

## 2013-12-23 ENCOUNTER — Other Ambulatory Visit: Payer: Self-pay | Admitting: Family Medicine

## 2014-01-02 DIAGNOSIS — H6980 Other specified disorders of Eustachian tube, unspecified ear: Secondary | ICD-10-CM | POA: Diagnosis not present

## 2014-01-02 DIAGNOSIS — H903 Sensorineural hearing loss, bilateral: Secondary | ICD-10-CM | POA: Diagnosis not present

## 2014-01-02 DIAGNOSIS — H905 Unspecified sensorineural hearing loss: Secondary | ICD-10-CM | POA: Diagnosis not present

## 2014-01-02 DIAGNOSIS — R0982 Postnasal drip: Secondary | ICD-10-CM | POA: Diagnosis not present

## 2014-01-04 ENCOUNTER — Telehealth: Payer: Self-pay

## 2014-01-04 NOTE — Telephone Encounter (Signed)
Vanessa Jones said did not get echo tracing. Vanessa Jones will refax request for information needed.

## 2014-02-08 ENCOUNTER — Ambulatory Visit (INDEPENDENT_AMBULATORY_CARE_PROVIDER_SITE_OTHER): Payer: Medicare Other | Admitting: Internal Medicine

## 2014-02-08 ENCOUNTER — Encounter: Payer: Self-pay | Admitting: Internal Medicine

## 2014-02-08 VITALS — BP 138/80 | HR 78 | Temp 97.8°F | Wt 188.5 lb

## 2014-02-08 DIAGNOSIS — J309 Allergic rhinitis, unspecified: Secondary | ICD-10-CM

## 2014-02-08 MED ORDER — PREDNISONE 10 MG PO TABS: ORAL_TABLET | ORAL | Status: DC

## 2014-02-08 NOTE — Patient Instructions (Addendum)

## 2014-02-08 NOTE — Progress Notes (Signed)
Pre visit review using our clinic review tool, if applicable. No additional management support is needed unless otherwise documented below in the visit note. 

## 2014-02-08 NOTE — Progress Notes (Signed)
HPI  Pt presents to the clinic today with c/o PND. She reports this started 3 weeks ago. She was seen for the same in 10/2013. She was given flonase and zyrtec at that time. She did not notice any improvement so she went to see an ENT. He advised her that she was too tried out and he started her on saline nasal spray. Within the last week, her mucous has turned to dark yellow. She is coughing of thick yellow mucous. She denies fever, chills or body aches. She is currently not taking anything for it OTC. She does not smoke. She does have a history of allergies. She has had sick contacts.  Review of Systems    Past Medical History  Diagnosis Date  . Hypertension   . Hyperlipidemia   . Acute back pain   . Osteoarthritis   . Allergic dermatitis   . Allergic rhinitis     Family History  Problem Relation Age of Onset  . Hypertension Mother   . Cancer Mother     colon, age 10  . Diabetes Father   . Emphysema Father   . Cancer Sister     breast  . Cancer Brother     bladder  . Coronary artery disease Brother   . Coronary artery disease Maternal Grandmother   . Heart disease Paternal Grandmother     enlarged heart  . Stroke Paternal Grandfather     History   Social History  . Marital Status: Married    Spouse Name: N/A    Number of Children: 2  . Years of Education: N/A   Occupational History  . Herbalist, retired    Social History Main Topics  . Smoking status: Never Smoker   . Smokeless tobacco: Never Used  . Alcohol Use: Yes     Comment: Occ glass of wine  . Drug Use: No  . Sexual Activity: Not on file   Other Topics Concern  . Not on file   Social History Narrative   Retired Herbalist   Married, 2 healthy boys    Regular exercise- yes, walking 3 days a week.   2006 treadmill stress test, low risk.      Moderate diet   No HCOPA, no living will, info given (reviewed 2013)    No Known Allergies   Constitutional:  Denies headache, fatigue,  fever or abrupt weight changes.  HEENT:  Positive nasal congestion. Denies eye redness, ear pain, ringing in the ears, wax buildup, runny nose or bloody nose. Respiratory: Positive cough. Denies difficulty breathing or shortness of breath.  Cardiovascular: Denies chest pain, chest tightness, palpitations or swelling in the hands or feet.   No other specific complaints in a complete review of systems (except as listed in HPI above).  Objective:  BP 138/80  Pulse 78  Temp(Src) 97.8 F (36.6 C) (Oral)  Wt 188 lb 8 oz (85.503 kg)  SpO2 97%   General: Appears her stated age, well developed, well nourished in NAD. HEENT: Head: normal shape and size, no sinus tenderness noted; Eyes: sclera white, no icterus, conjunctiva pink, PERRLA and EOMs intact; Ears: Tm's gray and intact, normal light reflex; Nose: mucosa pink and moist, septum midline; Throat/Mouth: + PND. Teeth present, mucosa pink and moist, no exudate noted, no lesions or ulcerations noted.  Neck: Neck supple, trachea midline. No massses, lumps or thyromegaly present.  Cardiovascular: Normal rate and rhythm. S1,S2 noted.  No murmur, rubs or gallops noted. No JVD or  BLE edema. No carotid bruits noted. Pulmonary/Chest: Normal effort and positive vesicular breath sounds. No respiratory distress. No wheezes, rales or ronchi noted.      Assessment & Plan:   Allergies:  Failed zyrtec and flonase Will try pred taper x 6 days Get a air purifier for your home   RTC as needed or if symptoms persist.

## 2014-05-21 ENCOUNTER — Other Ambulatory Visit: Payer: Self-pay | Admitting: Family Medicine

## 2014-10-01 ENCOUNTER — Other Ambulatory Visit (INDEPENDENT_AMBULATORY_CARE_PROVIDER_SITE_OTHER): Payer: Medicare Other

## 2014-10-01 ENCOUNTER — Telehealth: Payer: Self-pay | Admitting: Family Medicine

## 2014-10-01 DIAGNOSIS — M8580 Other specified disorders of bone density and structure, unspecified site: Secondary | ICD-10-CM

## 2014-10-01 DIAGNOSIS — I10 Essential (primary) hypertension: Secondary | ICD-10-CM

## 2014-10-01 LAB — COMPREHENSIVE METABOLIC PANEL
ALBUMIN: 3.5 g/dL (ref 3.5–5.2)
ALT: 10 U/L (ref 0–35)
AST: 17 U/L (ref 0–37)
Alkaline Phosphatase: 79 U/L (ref 39–117)
BUN: 13 mg/dL (ref 6–23)
CALCIUM: 9.2 mg/dL (ref 8.4–10.5)
CHLORIDE: 104 meq/L (ref 96–112)
CO2: 22 meq/L (ref 19–32)
Creatinine, Ser: 1 mg/dL (ref 0.4–1.2)
GFR: 61.57 mL/min (ref >60.00)
Glucose, Bld: 93 mg/dL (ref 70–99)
Potassium: 4.3 mEq/L (ref 3.5–5.1)
Sodium: 136 mEq/L (ref 135–145)
TOTAL PROTEIN: 6.7 g/dL (ref 6.0–8.3)
Total Bilirubin: 0.7 mg/dL (ref 0.2–1.2)

## 2014-10-01 LAB — LIPID PANEL
Cholesterol: 151 mg/dL (ref 0–200)
HDL: 53.7 mg/dL (ref >39.00)
LDL Cholesterol: 82 mg/dL (ref 0–99)
NONHDL: 97.3
Total CHOL/HDL Ratio: 3
Triglycerides: 78 mg/dL (ref 0.0–149.0)
VLDL: 15.6 mg/dL (ref 0.0–40.0)

## 2014-10-01 LAB — VITAMIN D 25 HYDROXY (VIT D DEFICIENCY, FRACTURES): VITD: 17.94 ng/mL — AB (ref 30.00–100.00)

## 2014-10-01 NOTE — Telephone Encounter (Signed)
-----   Message from Ellamae Sia sent at 09/26/2014 11:43 AM EDT ----- Regarding: Lab orders for Monday, 11.2.15 Patient is scheduled for CPX labs, please order future labs, Thanks , Karna Christmas

## 2014-10-02 ENCOUNTER — Telehealth: Payer: Self-pay | Admitting: Family Medicine

## 2014-10-02 NOTE — Telephone Encounter (Signed)
emmi emailed °

## 2014-10-05 ENCOUNTER — Ambulatory Visit (INDEPENDENT_AMBULATORY_CARE_PROVIDER_SITE_OTHER): Payer: Medicare Other | Admitting: Family Medicine

## 2014-10-05 ENCOUNTER — Encounter: Payer: Self-pay | Admitting: Family Medicine

## 2014-10-05 VITALS — BP 120/84 | HR 93 | Temp 98.1°F | Ht 65.0 in | Wt 185.5 lb

## 2014-10-05 DIAGNOSIS — I10 Essential (primary) hypertension: Secondary | ICD-10-CM | POA: Diagnosis not present

## 2014-10-05 DIAGNOSIS — B372 Candidiasis of skin and nail: Secondary | ICD-10-CM

## 2014-10-05 DIAGNOSIS — Z7189 Other specified counseling: Secondary | ICD-10-CM | POA: Diagnosis not present

## 2014-10-05 DIAGNOSIS — Z23 Encounter for immunization: Secondary | ICD-10-CM

## 2014-10-05 DIAGNOSIS — Z0000 Encounter for general adult medical examination without abnormal findings: Secondary | ICD-10-CM | POA: Diagnosis not present

## 2014-10-05 DIAGNOSIS — E559 Vitamin D deficiency, unspecified: Secondary | ICD-10-CM | POA: Diagnosis not present

## 2014-10-05 MED ORDER — VITAMIN D (ERGOCALCIFEROL) 1.25 MG (50000 UNIT) PO CAPS: 50000.0000 [IU] | ORAL_CAPSULE | ORAL | Status: DC

## 2014-10-05 MED ORDER — NYSTATIN 100000 UNIT/GM EX CREA: 1.0000 "application " | TOPICAL_CREAM | Freq: Two times a day (BID) | CUTANEOUS | Status: DC

## 2014-10-05 NOTE — Assessment & Plan Note (Signed)
Apply nystatin cream to rash.

## 2014-10-05 NOTE — Assessment & Plan Note (Signed)
Well controlled. Continue current medication.  

## 2014-10-05 NOTE — Progress Notes (Signed)
I have personally reviewed the Medicare Annual Wellness questionnaire and have noted  1. The patient's medical and social history  2. Their use of alcohol, tobacco or illicit drugs  3. Their current medications and supplements  4. The patient's functional ability including ADL's, fall risks, home safety risks and hearing or visual  impairment.  5. Diet and physical activities  6. Evidence for depression or mood disorders  The patients weight, height, BMI and visual acuity have been recorded in the chart  I have made referrals, counseling and provided education to the patient based review of the above and I have provided the pt with a written personalized care plan for preventive services.    She has been experience intermittent rash  X 1 month under breasts and under pannus. Rash is red and itchy.  She has treated it with  Cortisone, antibacterial cream.  No fever.  Has not been exercising lately.  Eating regularly but moderate fat and carb intake.  Elevated Cholesterol: At goal  < 130 on lipitor 10 mg  every other daily.  Lab Results  Component Value Date   CHOL 151 10/01/2014   HDL 53.70 10/01/2014   LDLCALC 82 10/01/2014   TRIG 78.0 10/01/2014   CHOLHDL 3 10/01/2014   Wt Readings from Last 3 Encounters:  10/05/14 185 lb 8 oz (84.142 kg)  02/08/14 188 lb 8 oz (85.503 kg)  11/16/13 186 lb 8 oz (84.596 kg)  Using medications without problems:None  Muscle aches: None  Other complaints:   Hypertension: Well controlled on HCTZ and lisinopril  BP Readings from Last 3 Encounters:  10/05/14 120/84  02/08/14 138/80  11/16/13 120/76  Using medication without problems or lightheadedness: none Chest pain with exertion:None  Edema:None   she feels tired occ She continues to be slightly dyspneic but not any worse. Average home BPs: Not checking.  Other issues:   Volunteering at hospice.  Review of Systems  Constitutional: Negative for fever, fatigue and  unexpected weight change.  HENT: Negative for congestion and tinnitus.  Eyes: Negative for pain.  Respiratory: Negative for cough, shortness of breath and wheezing.  Cardiovascular: Negative for chest pain, palpitations and leg swelling.  Gastrointestinal: Negative for abdominal pain, diarrhea, constipation and blood in stool.  Genitourinary: Negative for dysuria and hematuria.  Neurological: Negative for syncope, weakness and numbness.  No falls.  Objective:   Physical Exam  Constitutional: Vital signs are normal. She appears well-developed and well-nourished. She is cooperative. Non-toxic appearance. She does not appear ill. No distress.  HENT:  Head: Normocephalic.  Right Ear: Hearing, tympanic membrane, external ear and ear canal normal.  Left Ear: Hearing, tympanic membrane, external ear and ear canal normal.  Nose: Nose normal.  Eyes: Conjunctivae, EOM and lids are normal. Pupils are equal, round, and reactive to light. No foreign bodies found.  Neck: Trachea normal and normal range of motion. Neck supple. Carotid bruit is not present. No mass and no thyromegaly present.  Cardiovascular: Normal rate, regular rhythm, S1 normal, S2 normal, normal heart sounds and intact distal pulses. Exam reveals no gallop.  No murmur heard.  Pulmonary/Chest: Effort normal and breath sounds normal. No respiratory distress. She has no wheezes. She has no rhonchi. She has no rales.  Abdominal: Soft. Normal appearance and bowel sounds are normal. She exhibits no distension, no fluid wave, no abdominal bruit and no mass. There is no hepatosplenomegaly. There is no tenderness. There is no rebound, no guarding and no CVA tenderness. No hernia.  Genitourinary: No breast swelling, tenderness, discharge or bleeding. Pelvic exam was performed with patient prone.  Musculoskeletal:  Swelling , nodules and deformity in B DIP joints in hands No erythema, no PIP, no MCP changes  Lymphadenopathy:   She has no cervical adenopathy.  She has no axillary adenopathy.  Neurological: She is alert. She has normal strength. No cranial nerve deficit or sensory deficit.  Skin: Skin is warm, dry and intact Beefy red rash with satellite lesions under breast and under pannus B. Psychiatric: Her speech is normal and behavior is normal. Judgment normal. Her mood appears not anxious. Cognition and memory are normal. She does not exhibit a depressed mood.    Assessment and Plan:  The patient's preventative maintenance and recommended screening tests for an annual wellness exam were reviewed in full today.  Brought up to date unless services declined.  Counselled on the importance of diet, exercise, and its role in overall health and mortality.  The patient's FH and SH was reviewed, including their home life, tobacco status, and drug and alcohol status .  Colon: 01/2012 Dr. Gustavo Lah, [facility], polyps removed, repeat in 3 years. Family history of colon cancer in mother  PAP/DVE: PAP not indicated due to age. Has decided DVE, no symptoms, no family history of uterine and ovarian cancer.  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 Vaccines: Td and PNA shingles vaccine uptodate.  Will get  flu and prevnar.

## 2014-10-05 NOTE — Addendum Note (Signed)
Addended by: Carter Kitten on: 10/05/2014 04:44 PM   Modules accepted: Orders

## 2014-10-05 NOTE — Patient Instructions (Addendum)
Replete vit D x 12 weeks with prescription then change to daily 400 IU daily vit D3. Okay to continue Lipitor every other day, consider holding for 3 months prior to next years labs. Get back on track with exercise. Schedule mammogram on your own. Look into living will.

## 2014-10-05 NOTE — Progress Notes (Signed)
Pre visit review using our clinic review tool, if applicable. No additional management support is needed unless otherwise documented below in the visit note. 

## 2014-10-05 NOTE — Assessment & Plan Note (Signed)
Replete then start long term maintenance.

## 2014-11-12 DIAGNOSIS — L300 Nummular dermatitis: Secondary | ICD-10-CM | POA: Diagnosis not present

## 2014-11-12 DIAGNOSIS — R21 Rash and other nonspecific skin eruption: Secondary | ICD-10-CM | POA: Diagnosis not present

## 2014-11-12 DIAGNOSIS — L304 Erythema intertrigo: Secondary | ICD-10-CM | POA: Diagnosis not present

## 2014-12-31 DIAGNOSIS — L300 Nummular dermatitis: Secondary | ICD-10-CM | POA: Diagnosis not present

## 2014-12-31 DIAGNOSIS — L304 Erythema intertrigo: Secondary | ICD-10-CM | POA: Diagnosis not present

## 2014-12-31 DIAGNOSIS — L821 Other seborrheic keratosis: Secondary | ICD-10-CM | POA: Diagnosis not present

## 2015-01-22 ENCOUNTER — Other Ambulatory Visit: Payer: Self-pay | Admitting: Family Medicine

## 2015-03-22 ENCOUNTER — Ambulatory Visit (INDEPENDENT_AMBULATORY_CARE_PROVIDER_SITE_OTHER): Payer: Medicare Other | Admitting: Internal Medicine

## 2015-03-22 ENCOUNTER — Encounter: Payer: Self-pay | Admitting: Internal Medicine

## 2015-03-22 VITALS — BP 126/82 | HR 78 | Temp 98.0°F | Wt 183.5 lb

## 2015-03-22 DIAGNOSIS — S86912A Strain of unspecified muscle(s) and tendon(s) at lower leg level, left leg, initial encounter: Secondary | ICD-10-CM | POA: Insufficient documentation

## 2015-03-22 NOTE — Progress Notes (Signed)
Pre visit review using our clinic review tool, if applicable. No additional management support is needed unless otherwise documented below in the visit note. 

## 2015-03-22 NOTE — Patient Instructions (Addendum)
Please ice your left knee for 10-15 minutes every few hours for the next 2 days---then after you are up on it for a while. Take ibuprofen 2 tabs (400mg ) three times a day with food Try to limit walking on it Please use a compression brace also If the swelling isn't better next week, you should come in for it to be drained by me or Dr Lorelei Pont

## 2015-03-22 NOTE — Assessment & Plan Note (Signed)
No clear ligament or meniscus tear by exam Does have effusion Didn't want fluid removed today No immediate surgical eval needed  Discussed ice, ibuprofen Compression brace

## 2015-03-22 NOTE — Progress Notes (Signed)
Subjective:    Patient ID: Vanessa Jones, female    DOB: 1941-07-15, 74 y.o.   MRN: [id number]  HPI Here due to knee pain  Happened yesterday evening Was getting out of recliner--knee gave way trying to put weight on it Felt "like something like a rubber band broke" Pain shot up from knee up back of thigh  Caught herself before hitting the floor Walked to kitchen limping--realized something bad happened No ice Tried ibuprofen 200mg -- it helped.  Then tylenol PM at bedtime  Awoke this am--?some swelling Same limping as before  Current Outpatient Prescriptions on File Prior to Visit  Medication Sig Dispense Refill  . aspirin 81 MG tablet Take 81 mg by mouth as needed.     Marland Kitchen atorvastatin (LIPITOR) 10 MG tablet Take 1 tablet (10 mg total) by mouth daily. (Patient taking differently: Take 10 mg by mouth every other day. ) 90 tablet 2  . hydrochlorothiazide (HYDRODIURIL) 25 MG tablet TAKE 1/2 TABLET BY MOUTH EVERY DAY 45 tablet 3  . ibuprofen (ADVIL,MOTRIN) 200 MG tablet Take 200 mg by mouth every 6 (six) hours as needed.    Marland Kitchen lisinopril (PRINIVIL,ZESTRIL) 10 MG tablet TAKE 1 TABLET BY MOUTH EVERY DAY 90 tablet 3  . SALINE NASAL MIST NA Place into the nose.     No current facility-administered medications on file prior to visit.    No Known Allergies  Past Medical History  Diagnosis Date  . Hypertension   . Hyperlipidemia   . Acute back pain   . Osteoarthritis   . Allergic dermatitis   . Allergic rhinitis     Past Surgical History  Procedure Laterality Date  . Cholecystectomy    . Hemorrhoid surgery      Family History  Problem Relation Age of Onset  . Hypertension Mother   . Cancer Mother     colon, age 71  . Diabetes Father   . Emphysema Father   . Cancer Sister     breast  . Cancer Brother     bladder  . Coronary artery disease Brother   . Coronary artery disease Maternal Grandmother   . Heart disease Paternal Grandmother     enlarged heart  .  Stroke Paternal Grandfather     History   Social History  . Marital Status: Married    Spouse Name: N/A  . Number of Children: 2  . Years of Education: N/A   Occupational History  . Herbalist, retired    Social History Main Topics  . Smoking status: Never Smoker   . Smokeless tobacco: Never Used  . Alcohol Use: Yes     Comment: Occ glass of wine  . Drug Use: No  . Sexual Activity: Not on file   Other Topics Concern  . Not on file   Social History Narrative   Retired Herbalist   Married, 2 healthy boys    Regular exercise- yes, walking 3 days a week.   2006 treadmill stress test, low risk.      Moderate diet   No HCOPA, no living will, info given (reviewed 2015)   Review of Systems No fever Did have rash behind knee last week--?from bite. Uses OTC cream on it. Mostly better    Objective:   Physical Exam  Constitutional: She appears well-developed and well-nourished. No distress.  Musculoskeletal:  Mild to moderate effusion left knee No meniscus findings Slight pain with LCL testing--no clear ligament damage Can extend to ~150  degrees Pain with full passive flexion   Neurological:  Antalgic gait but can full weight bear No weakness in leg          Assessment & Plan:

## 2015-04-18 ENCOUNTER — Telehealth: Payer: Self-pay | Admitting: *Deleted

## 2015-04-18 ENCOUNTER — Encounter: Payer: Self-pay | Admitting: Family Medicine

## 2015-04-18 ENCOUNTER — Ambulatory Visit (INDEPENDENT_AMBULATORY_CARE_PROVIDER_SITE_OTHER): Payer: Medicare Other | Admitting: Family Medicine

## 2015-04-18 ENCOUNTER — Ambulatory Visit (INDEPENDENT_AMBULATORY_CARE_PROVIDER_SITE_OTHER)
Admission: RE | Admit: 2015-04-18 | Discharge: 2015-04-18 | Disposition: A | Discharge status: Home / Self Care | Payer: Medicare Other | Source: Ambulatory Visit | Attending: Family Medicine | Admitting: Family Medicine

## 2015-04-18 VITALS — BP 120/82 | HR 80 | Temp 98.3°F | Ht 65.0 in | Wt 181.5 lb

## 2015-04-18 DIAGNOSIS — K921 Melena: Secondary | ICD-10-CM

## 2015-04-18 DIAGNOSIS — K5732 Diverticulitis of large intestine without perforation or abscess without bleeding: Secondary | ICD-10-CM | POA: Diagnosis not present

## 2015-04-18 DIAGNOSIS — R1032 Left lower quadrant pain: Secondary | ICD-10-CM

## 2015-04-18 DIAGNOSIS — R194 Change in bowel habit: Secondary | ICD-10-CM

## 2015-04-18 DIAGNOSIS — R1031 Right lower quadrant pain: Secondary | ICD-10-CM

## 2015-04-18 DIAGNOSIS — R195 Other fecal abnormalities: Secondary | ICD-10-CM

## 2015-04-18 DIAGNOSIS — I700 Atherosclerosis of aorta: Secondary | ICD-10-CM | POA: Diagnosis not present

## 2015-04-18 LAB — CBC WITH DIFFERENTIAL/PLATELET
BASOS PCT: 0.4 % (ref 0.0–3.0)
Basophils Absolute: 0.1 10*3/uL (ref 0.0–0.1)
EOS PCT: 1.9 % (ref 0.0–5.0)
Eosinophils Absolute: 0.3 10*3/uL (ref 0.0–0.7)
HCT: 38.8 % (ref 36.0–46.0)
HEMOGLOBIN: 13.4 g/dL (ref 12.0–15.0)
LYMPHS PCT: 27.6 % (ref 12.0–46.0)
Lymphs Abs: 4.1 10*3/uL — ABNORMAL HIGH (ref 0.7–4.0)
MCHC: 34.4 g/dL (ref 30.0–36.0)
MCV: 88.3 fl (ref 78.0–100.0)
MONOS PCT: 6.3 % (ref 3.0–12.0)
Monocytes Absolute: 0.9 10*3/uL (ref 0.1–1.0)
Neutro Abs: 9.4 10*3/uL — ABNORMAL HIGH (ref 1.4–7.7)
Neutrophils Relative %: 63.8 % (ref 43.0–77.0)
Platelets: 428 10*3/uL — ABNORMAL HIGH (ref 150.0–400.0)
RBC: 4.39 Mil/uL (ref 3.87–5.11)
RDW: 14.3 % (ref 11.5–15.5)
WBC: 14.8 10*3/uL — AB (ref 4.0–10.5)

## 2015-04-18 LAB — COMPREHENSIVE METABOLIC PANEL
ALBUMIN: 4.2 g/dL (ref 3.5–5.2)
ALT: 12 U/L (ref 0–35)
AST: 24 U/L (ref 0–37)
Alkaline Phosphatase: 103 U/L (ref 39–117)
BILIRUBIN TOTAL: 0.4 mg/dL (ref 0.2–1.2)
BUN: 14 mg/dL (ref 6–23)
CALCIUM: 9.9 mg/dL (ref 8.4–10.5)
CO2: 24 mEq/L (ref 19–32)
Chloride: 96 mEq/L (ref 96–112)
Creatinine, Ser: 0.97 mg/dL (ref 0.40–1.20)
GFR: 60.02 mL/min (ref >60.00)
GLUCOSE: 94 mg/dL (ref 70–99)
Potassium: 3.6 mEq/L (ref 3.5–5.1)
Sodium: 130 mEq/L — ABNORMAL LOW (ref 135–145)
TOTAL PROTEIN: 7.8 g/dL (ref 6.0–8.3)

## 2015-04-18 MED ORDER — CIPROFLOXACIN HCL 500 MG PO TABS: 500.0000 mg | ORAL_TABLET | Freq: Two times a day (BID) | ORAL | Status: DC

## 2015-04-18 MED ORDER — METRONIDAZOLE 500 MG PO TABS: 500.0000 mg | ORAL_TABLET | Freq: Three times a day (TID) | ORAL | Status: DC

## 2015-04-18 MED ORDER — IOHEXOL 300 MG/ML  SOLN
100.0000 mL | Freq: Once | INTRAMUSCULAR | Status: AC | PRN
  Administered 2015-04-18: 100 mL via INTRAVENOUS

## 2015-04-18 NOTE — Patient Instructions (Signed)
Stop at lab and front desk on way out.   No food until exam only water.  Go to ER if severe pain, or cannot keep down fluids.

## 2015-04-18 NOTE — Progress Notes (Signed)
Pre visit review using our clinic review tool, if applicable. No additional management support is needed unless otherwise documented below in the visit note. 

## 2015-04-18 NOTE — Progress Notes (Signed)
Subjective:    Patient ID: Vanessa Jones, female    DOB: 28-Jul-1941, 74 y.o.   MRN: [id number]  HPI  74 year old female with history of HTN presents with new onset low abdominal cramps in last 4 days. Gassy pain in low abd omen. Pain is intermittent,cramping. She has worse pain in lower abdomen with trying to have BM.  Stool was in curly Qs last 2 weeks.  Then stool appears dark black stool.. Coffee grounds. No N/ V. No fever. She did try to take dulcolax  For several days, last dose yesterday.. Stool turned more yellow but now back to black.  She has had loose stool all week, watery pebbles.  She has had yogurt, yesterday had stewed potatos and cornbread. Drinking water, drinking lots of green tea iced.  Colonoscopy: 2013 Had diverticulosis noted.  She has her appendix.  No sick contacts.   Review of Systems  Constitutional: Negative for fever and fatigue.  HENT: Negative for ear pain.   Eyes: Negative for pain.  Respiratory: Negative for chest tightness and shortness of breath.   Cardiovascular: Negative for chest pain, palpitations and leg swelling.  Gastrointestinal: Negative for abdominal pain.  Genitourinary: Negative for dysuria.       Objective:   Physical Exam  Constitutional: Vital signs are normal. She appears well-developed and well-nourished. She is cooperative.  Non-toxic appearance. She does not appear ill. No distress.  HENT:  Head: Normocephalic.  Right Ear: Hearing, tympanic membrane, external ear and ear canal normal. Tympanic membrane is not erythematous, not retracted and not bulging.  Left Ear: Hearing, tympanic membrane, external ear and ear canal normal. Tympanic membrane is not erythematous, not retracted and not bulging.  Nose: No mucosal edema or rhinorrhea. Right sinus exhibits no maxillary sinus tenderness and no frontal sinus tenderness. Left sinus exhibits no maxillary sinus tenderness and no frontal sinus tenderness.  Mouth/Throat:  Uvula is midline, oropharynx is clear and moist and mucous membranes are normal.  Eyes: Conjunctivae, EOM and lids are normal. Pupils are equal, round, and reactive to light. Lids are everted and swept, no foreign bodies found.  Neck: Trachea normal and normal range of motion. Neck supple. Carotid bruit is not present. No thyroid mass and no thyromegaly present.  Cardiovascular: Normal rate, regular rhythm, S1 normal, S2 normal, normal heart sounds, intact distal pulses and normal pulses.  Exam reveals no gallop and no friction rub.   No murmur heard. Pulmonary/Chest: Effort normal and breath sounds normal. No tachypnea. No respiratory distress. She has no decreased breath sounds. She has no wheezes. She has no rhonchi. She has no rales.  Abdominal: Soft. Normal appearance and bowel sounds are normal. There is tenderness in the right lower quadrant, periumbilical area and left lower quadrant.  Left greater than right  Neurological: She is alert.  Skin: Skin is warm, dry and intact. No rash noted.  Psychiatric: Her speech is normal and behavior is normal. Judgment and thought content normal. Her mood appears not anxious. Cognition and memory are normal. She does not exhibit a depressed mood.          Assessment & Plan:  Lower abdominal pain, possible melena, change in caliber of stools.  No clear viral GE. No Upper GI pain to be consistent with coffee ground stool. Neg heme occult.  Concerning for acute diverticulitis. Will send for labs and CT abd pelvis. Recent colonoscopy in last 3 years, doubt new colon cancer, but consider if initial eval negative.  Addendum: CT showed acute diverticulitis. Pt notified and flagyl and cipro x 10 days started. Close follow up recommended.

## 2015-04-18 NOTE — Telephone Encounter (Signed)
Vanessa Jones at [facility] said rx ready for pick up. Spoke with pt and she voiced understanding.

## 2015-04-18 NOTE — Telephone Encounter (Signed)
Call report of CT-Uncomplicated Acute Diverticulitis.

## 2015-05-02 ENCOUNTER — Encounter: Payer: Self-pay | Admitting: Family Medicine

## 2015-05-02 ENCOUNTER — Ambulatory Visit (INDEPENDENT_AMBULATORY_CARE_PROVIDER_SITE_OTHER): Payer: Medicare Other | Admitting: Family Medicine

## 2015-05-02 VITALS — BP 130/94 | HR 82 | Temp 97.7°F | Ht 65.0 in | Wt 183.5 lb

## 2015-05-02 DIAGNOSIS — K5792 Diverticulitis of intestine, part unspecified, without perforation or abscess without bleeding: Secondary | ICD-10-CM | POA: Insufficient documentation

## 2015-05-02 DIAGNOSIS — R1031 Right lower quadrant pain: Secondary | ICD-10-CM

## 2015-05-02 DIAGNOSIS — R1032 Left lower quadrant pain: Secondary | ICD-10-CM | POA: Diagnosis not present

## 2015-05-02 DIAGNOSIS — K921 Melena: Secondary | ICD-10-CM

## 2015-05-02 NOTE — Progress Notes (Signed)
   Subjective:    Patient ID: Vanessa Jones, female    DOB: [birth year], 74 y.o.   MRN: [id number]  HPI  74 year old female patient  With history of HTNpresents with continued  Bilateral lower quadrantabdominal pain. Seen on 04/18/2015 with acute bilateral lower quadrant pain, melena and change in stool character.  Hemeoccult  nml but minimal stool.CT abd pelvis showed acute diverticulitis. Treated with cipro and flagyl.   Completed all antibiotics, no missed doses. Last dose on 04/28/2015.   Today she reports  has had improvement in pain, no further pain.  No fever.  She has had indigestion today as adding nml food back. Using activia yogurt. She continues to have dark tarry stool, pudding like stool. 3 BMs a day.  She had recently been using 2 ibuprofen 3 times a day for knee pain x 1 week, few weeks prior.  5/19 hg 13.4  Recent colonoscopy in 2013.     Review of Systems  Constitutional: Negative for fever and fatigue.  HENT: Negative for ear pain.   Eyes: Negative for pain.  Respiratory: Negative for chest tightness and shortness of breath.   Cardiovascular: Negative for chest pain, palpitations and leg swelling.  Gastrointestinal: Negative for abdominal pain.  Genitourinary: Negative for dysuria.       Objective:   Physical Exam  Constitutional: Vital signs are normal. She appears well-developed and well-nourished. She is cooperative.  Non-toxic appearance. She does not appear ill. No distress.  HENT:  Head: Normocephalic.  Right Ear: Hearing, tympanic membrane, external ear and ear canal normal. Tympanic membrane is not erythematous, not retracted and not bulging.  Left Ear: Hearing, tympanic membrane, external ear and ear canal normal. Tympanic membrane is not erythematous, not retracted and not bulging.  Nose: No mucosal edema or rhinorrhea. Right sinus exhibits no maxillary sinus tenderness and no frontal sinus tenderness. Left sinus exhibits no maxillary sinus  tenderness and no frontal sinus tenderness.  Mouth/Throat: Uvula is midline, oropharynx is clear and moist and mucous membranes are normal.  Eyes: Conjunctivae, EOM and lids are normal. Pupils are equal, round, and reactive to light. Lids are everted and swept, no foreign bodies found.  Neck: Trachea normal and normal range of motion. Neck supple. Carotid bruit is not present. No thyroid mass and no thyromegaly present.  Cardiovascular: Normal rate, regular rhythm, S1 normal, S2 normal, normal heart sounds, intact distal pulses and normal pulses.  Exam reveals no gallop and no friction rub.   No murmur heard. Pulmonary/Chest: Effort normal and breath sounds normal. No tachypnea. No respiratory distress. She has no decreased breath sounds. She has no wheezes. She has no rhonchi. She has no rales.  Abdominal: Soft. Normal appearance and bowel sounds are normal. There is no hepatosplenomegaly. There is no tenderness. There is no rebound, no guarding and no CVA tenderness.  Neurological: She is alert.  Skin: Skin is warm, dry and intact. No rash noted.  Psychiatric: Her speech is normal and behavior is normal. Judgment and thought content normal. Her mood appears not anxious. Cognition and memory are normal. She does not exhibit a depressed mood.          Assessment & Plan:

## 2015-05-02 NOTE — Assessment & Plan Note (Signed)
Resolved pain with antibiotics.  BMs not back to normal. Can use probiotic for nml bacterial regrowth.

## 2015-05-02 NOTE — Progress Notes (Signed)
Pre visit review using our clinic review tool, if applicable. No additional management support is needed unless otherwise documented below in the visit note. 

## 2015-05-02 NOTE — Patient Instructions (Addendum)
Hold any ibuprofen. Return stool card in next few days. Gradually advance diet. Push fluids. Continue activia or lactobaccili probiotic capsules. Hold ibuprofen or aleve, use tyelnol for pain.

## 2015-05-02 NOTE — Assessment & Plan Note (Signed)
?   If irritation from recent NSAIDs.  No anemia on recent cbc. Nml hemeoccult in office on 5/19 but minimal sample obtained. Will send home with hemeoccult to eval for upper GI source of bleed.  May need referral to GI and repeat cbc.

## 2015-05-07 ENCOUNTER — Other Ambulatory Visit (INDEPENDENT_AMBULATORY_CARE_PROVIDER_SITE_OTHER): Payer: Medicare Other | Admitting: Family Medicine

## 2015-05-07 DIAGNOSIS — K921 Melena: Secondary | ICD-10-CM

## 2015-05-07 LAB — POC HEMOCCULT BLD/STL (HOME/3-CARD/SCREEN)
Card #3 Fecal Occult Blood, POC: NEGATIVE
FECAL OCCULT BLD: NEGATIVE
FECAL OCCULT BLD: NEGATIVE

## 2015-05-07 NOTE — Addendum Note (Signed)
Addended by: Carter Kitten on: 05/07/2015 12:26 PM   Modules accepted: Orders

## 2015-05-07 NOTE — Addendum Note (Signed)
Addended by: Carter Kitten on: 05/07/2015 12:22 PM   Modules accepted: Orders

## 2015-05-08 ENCOUNTER — Ambulatory Visit (INDEPENDENT_AMBULATORY_CARE_PROVIDER_SITE_OTHER): Payer: Medicare Other | Admitting: Family Medicine

## 2015-05-08 ENCOUNTER — Encounter: Payer: Self-pay | Admitting: Family Medicine

## 2015-05-08 ENCOUNTER — Ambulatory Visit (INDEPENDENT_AMBULATORY_CARE_PROVIDER_SITE_OTHER)
Admission: RE | Admit: 2015-05-08 | Discharge: 2015-05-08 | Disposition: A | Discharge status: Home / Self Care | Payer: Medicare Other | Source: Ambulatory Visit | Attending: Family Medicine | Admitting: Family Medicine

## 2015-05-08 VITALS — BP 138/74 | HR 78 | Temp 97.9°F | Ht 65.0 in | Wt 179.2 lb

## 2015-05-08 DIAGNOSIS — M25562 Pain in left knee: Secondary | ICD-10-CM

## 2015-05-08 DIAGNOSIS — M1712 Unilateral primary osteoarthritis, left knee: Secondary | ICD-10-CM

## 2015-05-08 DIAGNOSIS — M25462 Effusion, left knee: Secondary | ICD-10-CM | POA: Diagnosis not present

## 2015-05-08 MED ORDER — METHYLPREDNISOLONE ACETATE 40 MG/ML IJ SUSP
80.0000 mg | Freq: Once | INTRAMUSCULAR | Status: AC
  Administered 2015-05-08: 80 mg via INTRA_ARTICULAR

## 2015-05-08 NOTE — Progress Notes (Signed)
Dr. Frederico Hamman T. Tacy Chavis, MD, [facility] and Sports Medicine [place], [address] Phone: [phone] Fax: [phone]  05/08/2015  Patient: Vanessa Jones, MRN: [id number], DOB: 03/26/1941, 74 y.o.  Primary Physician:  Eliezer Lofts, MD  Chief Complaint: Knee Pain  Subjective:   Vanessa Jones is a 74 y.o. very pleasant female patient who presents with the following:  L Knee: About 2 months, had some knee pain and had some knee pain ever since. Thought pulled her back.  She is continue to have some pain in her knee and an effusion ever since this time.  Her knee has not been locking up and she is not really having any particular mechanical symptoms.  She does not have any significant prior knee history, trauma, or prior surgery.  She has tried some basic over-the-counter remedies such as ice as well as Tylenol and NSAIDs.  She saw Dr. Bo Mcclintock back approximately 2 weeks ago, and he recommended ibuprofen, icing and compression.  Compression brace, the patient thinks is actually uncomfortable.  No specific injury, felt like a rubber band snapped. Got out of a recliner.   Asp 15 cc  inj L knee  Past Medical History, Surgical History, Social History, Family History, Problem List, Medications, and Allergies have been reviewed and updated if relevant.  Patient Active Problem List   Diagnosis Date Noted  . Acute diverticulitis 05/02/2015  . Melena 05/02/2015  . Strain of left knee 03/22/2015  . Counseling regarding end of life decision making 10/05/2014  . Vitamin D deficiency 10/05/2014  . Candidal intertrigo 10/05/2014  . Cellulitis of groin 07/27/2013  . Other malaise and fatigue 01/26/2013  . 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 Osteopenia 05/15/2011  . Essential hypertension, benign 03/20/2008  . ALLERGIC RHINITIS 03/20/2008  . DERMATITIS, ALLERGIC 03/20/2008  . OSTEOARTHRITIS 03/20/2008    Past Medical History  Diagnosis Date    . Hypertension   . Hyperlipidemia   . Acute back pain   . Osteoarthritis   . Allergic dermatitis   . Allergic rhinitis     Past Surgical History  Procedure Laterality Date  . Cholecystectomy    . Hemorrhoid surgery      History   Social History  . Marital Status: Married    Spouse Name: N/A  . Number of Children: 2  . Years of Education: N/A   Occupational History  . Herbalist, retired    Social History Main Topics  . Smoking status: Never Smoker   . Smokeless tobacco: Never Used  . Alcohol Use: Yes     Comment: Occ glass of wine  . Drug Use: No  . Sexual Activity: Not on file   Other Topics Concern  . Not on file   Social History Narrative   Retired Herbalist   Married, 2 healthy boys    Regular exercise- yes, walking 3 days a week.   2006 treadmill stress test, low risk.      Moderate diet   No HCOPA, no living will, info given (reviewed 2015)    Family History  Problem Relation Age of Onset  . Hypertension Mother   . Cancer Mother     colon, age 30  . Diabetes Father   . Emphysema Father   . Cancer Sister     breast  . Cancer Brother     bladder  . Coronary artery disease Brother   . Coronary artery disease Maternal Grandmother   .  Heart disease Paternal Grandmother     enlarged heart  . Stroke Paternal Grandfather     No Known Allergies  Medication list reviewed and updated in full in [facility].   GEN: No fevers, chills. Nontoxic. Primarily MSK c/o today. MSK: Detailed in the HPI GI: tolerating PO intake without difficulty Neuro: No numbness, parasthesias, or tingling associated. Otherwise the pertinent positives of the ROS are noted above.   Objective:   BP 138/74 mmHg  Pulse 78  Temp(Src) 97.9 F (36.6 C) (Oral)  Ht 5\' 5"  (1.651 m)  Wt 179 lb 4 oz (81.307 kg)  BMI 29.83 kg/m2   GEN: WDWN, NAD, Non-toxic, Alert & Oriented x 3 HEENT: Atraumatic, Normocephalic.  Ears and Nose: No external deformity. EXTR:  No clubbing/cyanosis/edema NEURO: Normal gait, antalgia PSYCH: Normally interactive. Conversant. Not depressed or anxious appearing.  Calm demeanor.    Left knee: The patient lacks 2 of extension.  Flexion to 115.  Further than this causes extreme pain.  Patient has mild medial joint line tenderness and lateral joint line tenderness as well.  She also has some significant patellar crepitus and pain with loading the medial and lateral patellar facets.  MCL, LCL, ACL, and PCL are all stable.  Flexion pinch test causes extreme pain and McMurray's test is positive for pain but no mechanical symptoms.  Bounce home testing is negative.  Radiology: Dg Knee Ap/lat W/sunrise Left  05/08/2015   CLINICAL DATA:  Posterior left knee pain for 1 month.  EXAM: LEFT KNEE 3 VIEWS  COMPARISON:  None.  FINDINGS: No acute fracture or dislocation is identified. There is a small knee joint effusion. Mild joint space narrowing and marginal osteophytosis are present in the lateral compartment. Mild patellar spurring is also noted. No lytic or blastic osseous lesion.  IMPRESSION: Mild lateral compartment osteoarthrosis.  Small knee joint effusion.   Electronically Signed   By: Logan Bores   On: 05/08/2015 14:16   The radiological images were independently reviewed by myself in the office and results were reviewed with the patient. My independent interpretation of images:  Tricompartmental arthritis, mild in nature, greater in the lateral compartment compared to others.  There is no evidence for occult fracture or dislocation. Owens Loffler, MD   Assessment and Plan:   Osteoarthrosis, localized, primary, knee, left  Left knee pain - Plan: DG Knee AP/LAT W/Sunrise Left, methylPREDNISolone acetate (DEPO-MEDROL) injection 80 mg   Osteoarthritic flare versus internal derangement of the left knee.  Some component of degenerative meniscal tearing would seem most likely.  Continue with conservative care, oral  anti-inflammatories, icing, and we will aspirate and inject the patient's knee with corticosteroid.  Follow-up after this, and if symptoms persist additional workup would be indicated.  Knee Aspiration and Injection, LEFT Patient verbally consented; risks, benefits, and alternatives explained including possible infection. Patient prepped with Chloraprep. Ethyl chloride for anesthesia. 10 cc of 1% Lidocaine used in wheal then injected Subcutaneous fashion with 22 gauge needle on lateral approach. Under sterilne conditions, 18 gauge needle used via lateral approach to aspirate 15 cc of serosanguinous fluid. Then 8 cc of Lidocaine 1% and Depo-Medrol 80 mg injected. Tolerated well, decreased pain, no complications.   Follow-up: 4-6 weeks  New Prescriptions   No medications on file   Orders Placed This Encounter  Procedures  . DG Knee AP/LAT W/Sunrise Left    Signed,  Grizel Vesely T. Glover Capano, MD   Patient's Medications  New Prescriptions   No medications on  file  Previous Medications   ASPIRIN 81 MG TABLET    Take 81 mg by mouth as needed.    ATORVASTATIN (LIPITOR) 10 MG TABLET    Take 1 tablet (10 mg total) by mouth daily.   CHOLECALCIFEROL PO    Take 1 tablet by mouth daily.   HYDROCHLOROTHIAZIDE (HYDRODIURIL) 25 MG TABLET    TAKE 1/2 TABLET BY MOUTH EVERY DAY   LISINOPRIL (PRINIVIL,ZESTRIL) 10 MG TABLET    TAKE 1 TABLET BY MOUTH EVERY DAY  Modified Medications   No medications on file  Discontinued Medications   IBUPROFEN (ADVIL,MOTRIN) 200 MG TABLET    Take 200 mg by mouth every 6 (six) hours as needed.

## 2015-05-08 NOTE — Progress Notes (Signed)
Pre visit review using our clinic review tool, if applicable. No additional management support is needed unless otherwise documented below in the visit note. 

## 2015-07-03 ENCOUNTER — Encounter: Payer: Self-pay | Admitting: Family Medicine

## 2015-07-03 ENCOUNTER — Ambulatory Visit (INDEPENDENT_AMBULATORY_CARE_PROVIDER_SITE_OTHER): Payer: Medicare Other | Admitting: Family Medicine

## 2015-07-03 VITALS — BP 120/78 | HR 81 | Temp 98.5°F | Ht 65.0 in | Wt 183.5 lb

## 2015-07-03 DIAGNOSIS — M25562 Pain in left knee: Secondary | ICD-10-CM

## 2015-07-03 NOTE — Progress Notes (Signed)
Dr. Frederico Hamman T. Makenleigh Crownover, MD, [facility] and Sports Medicine [place], [address] Phone: [phone] Fax: [phone]  07/03/2015  Patient: Vanessa Jones, MRN: [id number], DOB: August 31, 1941, 74 y.o.  Primary Physician:  Eliezer Lofts, MD  Chief Complaint: Knee Pain  Subjective:   Vanessa Jones is a 74 y.o. very pleasant female patient who presents with the following:  Very pleasant patient that I remember well the last saw approximately 2 months ago.  Injury is described in detailed below.  At that point aspirated and injected her knee with corticosteroid, and we were debating between more of a pure osteoarthritic component versus a acute internal derangement, more likely meniscal pathology.  She did fairly well for 3 or 4 weeks, but at this point she has had return of pain, and she is having some continued effusions and pain.  This is limiting her shopping and her ability to do do what she wants to do.  Recently started to swell again with some posterior pain.   My preference - [place] MD. If needed.  05/08/2015 Last OV with Owens Loffler, MD  L Knee: About 2 months, had some knee pain and had some knee pain ever since. Thought pulled her back.  She is continue to have some pain in her knee and an effusion ever since this time.  Her knee has not been locking up and she is not really having any particular mechanical symptoms.  She does not have any significant prior knee history, trauma, or prior surgery.  She has tried some basic over-the-counter remedies such as ice as well as Tylenol and NSAIDs.  She saw Dr. Bo Mcclintock back approximately 2 weeks ago, and he recommended ibuprofen, icing and compression.  Compression brace, the patient thinks is actually uncomfortable.  No specific injury, felt like a rubber band snapped. Got out of a recliner.   Asp 15 cc  inj L knee  Past Medical History, Surgical History, Social History, Family History,  Problem List, Medications, and Allergies have been reviewed and updated if relevant.  Patient Active Problem List   Diagnosis Date Noted  . Acute diverticulitis 05/02/2015  . Melena 05/02/2015  . Strain of left knee 03/22/2015  . Counseling regarding end of life decision making 10/05/2014  . Vitamin D deficiency 10/05/2014  . Candidal intertrigo 10/05/2014  . Cellulitis of groin 07/27/2013  . Other malaise and fatigue 01/26/2013  . 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 Osteopenia 05/15/2011  . Essential hypertension, benign 03/20/2008  . ALLERGIC RHINITIS 03/20/2008  . DERMATITIS, ALLERGIC 03/20/2008  . OSTEOARTHRITIS 03/20/2008    Past Medical History  Diagnosis Date  . Hypertension   . Hyperlipidemia   . Acute back pain   . Osteoarthritis   . Allergic dermatitis   . Allergic rhinitis     Past Surgical History  Procedure Laterality Date  . Cholecystectomy    . Hemorrhoid surgery      History   Social History  . Marital Status: Married    Spouse Name: N/A  . Number of Children: 2  . Years of Education: N/A   Occupational History  . Herbalist, retired    Social History Main Topics  . Smoking status: Never Smoker   . Smokeless tobacco: Never Used  . Alcohol Use: Yes     Comment: Occ glass of wine  . Drug Use: No  . Sexual Activity: Not on file   Other Topics Concern  . Not  on file   Social History Narrative   Retired Herbalist   Married, 2 healthy boys    Regular exercise- yes, walking 3 days a week.   2006 treadmill stress test, low risk.      Moderate diet   No HCOPA, no living will, info given (reviewed 2015)    Family History  Problem Relation Age of Onset  . Hypertension Mother   . Cancer Mother     colon, age 73  . Diabetes Father   . Emphysema Father   . Cancer Sister     breast  . Cancer Brother     bladder  . Coronary artery disease Brother   . Coronary artery disease Maternal Grandmother   . Heart disease  Paternal Grandmother     enlarged heart  . Stroke Paternal Grandfather     No Known Allergies  Medication list reviewed and updated in full in [facility].   GEN: No fevers, chills. Nontoxic. Primarily MSK c/o today. MSK: Detailed in the HPI GI: tolerating PO intake without difficulty Neuro: No numbness, parasthesias, or tingling associated. Otherwise the pertinent positives of the ROS are noted above.   Objective:   BP 120/78 mmHg  Pulse 81  Temp(Src) 98.5 F (36.9 C) (Oral)  Ht 5\' 5"  (1.651 m)  Wt 183 lb 8 oz (83.235 kg)  BMI 30.54 kg/m2   GEN: WDWN, NAD, Non-toxic, Alert & Oriented x 3 HEENT: Atraumatic, Normocephalic.  Ears and Nose: No external deformity. EXTR: No clubbing/cyanosis/edema NEURO: Normal gait, antalgia PSYCH: Normally interactive. Conversant. Not depressed or anxious appearing.  Calm demeanor.    Left knee: The patient lacks 2 of extension.  Flexion to 115.  Further than this causes extreme pain.  Patient has mild medial joint line tenderness and lateral joint line tenderness as well.  She also has some significant patellar crepitus and pain with loading the medial and lateral patellar facets.  MCL, LCL, ACL, and PCL are all stable.  Flexion pinch test causes extreme pain and McMurray's test is positive for pain but no mechanical symptoms.  Bounce home testing is negative.  Radiology: Dg Knee Ap/lat W/sunrise Left  05/08/2015   CLINICAL DATA:  Posterior left knee pain for 1 month.  EXAM: LEFT KNEE 3 VIEWS  COMPARISON:  None.  FINDINGS: No acute fracture or dislocation is identified. There is a small knee joint effusion. Mild joint space narrowing and marginal osteophytosis are present in the lateral compartment. Mild patellar spurring is also noted. No lytic or blastic osseous lesion.  IMPRESSION: Mild lateral compartment osteoarthrosis.  Small knee joint effusion.   Electronically Signed   By: Logan Bores   On: 05/08/2015 14:16   The radiological  images were independently reviewed by myself in the office and results were reviewed with the patient. My independent interpretation of images:  Tricompartmental arthritis, mild in nature, greater in the lateral compartment compared to others.  There is no evidence for occult fracture or dislocation. Owens Loffler, MD   Assessment and Plan:   Left knee pain - Plan: MR Knee Left  Wo Contrast  Four-month history of knee pain with failure of conservative management including NSAIDs, Tylenol, icing, and bracing.  She also has had an aspiration and injection of corticosteroid.  By history and examination, repetitive effusions and history of an acute event, suspicion for meniscal pathology is most likely.  Obtain an MRI of the left knee to evaluate for occult internal derangement, meniscal pathology, and evaluate  the cruciate ligaments.  Orders Placed This Encounter  Procedures  . MR Knee Left  Wo Contrast    Signed,  Myrakle Wingler T. Diondre Pulis, MD   Patient's Medications  New Prescriptions   No medications on file  Previous Medications   ASPIRIN 81 MG TABLET    Take 81 mg by mouth as needed.    ATORVASTATIN (LIPITOR) 10 MG TABLET    Take 1 tablet (10 mg total) by mouth daily.   CHOLECALCIFEROL PO    Take 1 tablet by mouth daily.   HYDROCHLOROTHIAZIDE (HYDRODIURIL) 25 MG TABLET    TAKE 1/2 TABLET BY MOUTH EVERY DAY   LISINOPRIL (PRINIVIL,ZESTRIL) 10 MG TABLET    TAKE 1 TABLET BY MOUTH EVERY DAY  Modified Medications   No medications on file  Discontinued Medications   No medications on file

## 2015-07-03 NOTE — Progress Notes (Signed)
Pre visit review using our clinic review tool, if applicable. No additional management support is needed unless otherwise documented below in the visit note. 

## 2015-07-03 NOTE — Patient Instructions (Signed)

## 2015-07-09 ENCOUNTER — Other Ambulatory Visit: Payer: Self-pay | Admitting: Family Medicine

## 2015-07-09 ENCOUNTER — Ambulatory Visit
Admission: RE | Admit: 2015-07-09 | Discharge: 2015-07-09 | Disposition: A | Discharge status: Home / Self Care | Payer: Medicare Other | Source: Ambulatory Visit | Attending: Family Medicine | Admitting: Family Medicine

## 2015-07-09 DIAGNOSIS — M94262 Chondromalacia, left knee: Secondary | ICD-10-CM | POA: Insufficient documentation

## 2015-07-09 DIAGNOSIS — M25462 Effusion, left knee: Secondary | ICD-10-CM | POA: Insufficient documentation

## 2015-07-09 DIAGNOSIS — S83207A Unspecified tear of unspecified meniscus, current injury, left knee, initial encounter: Secondary | ICD-10-CM

## 2015-07-09 DIAGNOSIS — M25562 Pain in left knee: Secondary | ICD-10-CM

## 2015-07-09 DIAGNOSIS — S83282A Other tear of lateral meniscus, current injury, left knee, initial encounter: Secondary | ICD-10-CM | POA: Insufficient documentation

## 2015-07-24 DIAGNOSIS — S83272A Complex tear of lateral meniscus, current injury, left knee, initial encounter: Secondary | ICD-10-CM | POA: Diagnosis not present

## 2015-07-24 DIAGNOSIS — M1712 Unilateral primary osteoarthritis, left knee: Secondary | ICD-10-CM | POA: Diagnosis not present

## 2015-08-01 ENCOUNTER — Other Ambulatory Visit: Payer: Self-pay

## 2015-08-02 ENCOUNTER — Encounter: Payer: Self-pay | Admitting: *Deleted

## 2015-08-02 NOTE — Patient Instructions (Signed)
  Your procedure is scheduled on: 08-08-15 Report to [place] To find out your arrival time please call [phone] between 1PM - 3PM on 08-07-15 [facility])  Remember: Instructions that are not followed completely may result in serious medical risk, up to and including death, or upon the discretion of your surgeon and anesthesiologist your surgery may need to be rescheduled.    __X__ 1. Do not eat food or drink liquids after midnight. No gum chewing or hard candies.     __X__ 2. No Alcohol for 24 hours before or after surgery.   ____ 3. Bring all medications with you on the day of surgery if instructed.    __X__ 4. Notify your doctor if there is any change in your medical condition     (cold, fever, infections).     Do not wear jewelry, make-up, hairpins, clips or nail polish.  Do not wear lotions, powders, or perfumes. You may wear deodorant.  Do not shave 48 hours prior to surgery. Men may shave face and neck.  Do not bring valuables to the hospital.    [facility] is not responsible for any belongings or valuables.               Contacts, dentures or bridgework may not be worn into surgery.  Leave your suitcase in the car. After surgery it may be brought to your room.  For patients admitted to the hospital, discharge time is determined by your treatment team.   Patients discharged the day of surgery will not be allowed to drive home.   Please read over the following fact sheets that you were given:      _X___ Take these medicines the morning of surgery with A SIP OF WATER:    1. LISINOPRIL  2.   3.   4.  5.  6.  ____ Fleet Enema (as directed)   _X___ Use CHG Soap as directed  ____ Use inhalers on the day of surgery  ____ Stop metformin 2 days prior to surgery    ____ Take 1/2 of usual insulin dose the night before surgery and none on the morning of surgery.   ____ Stop Coumadin/Plavix/aspirin-PT STOPPED ASPIRIN LAST WEEK  ____ Stop  Anti-inflammatories -NO NSAIDS OR ASPIRIN PRODUCTS-TYLENOL OK   ____ Stop supplements until after surgery.    ____ Bring C-Pap to the hospital.

## 2015-08-06 ENCOUNTER — Encounter
Admission: RE | Admit: 2015-08-06 | Discharge: 2015-08-06 | Disposition: A | Discharge status: Home / Self Care | Payer: Medicare Other | Source: Ambulatory Visit | Attending: Anesthesiology | Admitting: Anesthesiology

## 2015-08-06 DIAGNOSIS — E785 Hyperlipidemia, unspecified: Secondary | ICD-10-CM | POA: Diagnosis not present

## 2015-08-06 DIAGNOSIS — Z7982 Long term (current) use of aspirin: Secondary | ICD-10-CM | POA: Diagnosis not present

## 2015-08-06 DIAGNOSIS — Z8249 Family history of ischemic heart disease and other diseases of the circulatory system: Secondary | ICD-10-CM | POA: Diagnosis not present

## 2015-08-06 DIAGNOSIS — Z800 Family history of malignant neoplasm of digestive organs: Secondary | ICD-10-CM | POA: Diagnosis not present

## 2015-08-06 DIAGNOSIS — Z79899 Other long term (current) drug therapy: Secondary | ICD-10-CM | POA: Diagnosis not present

## 2015-08-06 DIAGNOSIS — M94262 Chondromalacia, left knee: Secondary | ICD-10-CM | POA: Diagnosis not present

## 2015-08-06 DIAGNOSIS — L239 Allergic contact dermatitis, unspecified cause: Secondary | ICD-10-CM | POA: Diagnosis not present

## 2015-08-06 DIAGNOSIS — Z803 Family history of malignant neoplasm of breast: Secondary | ICD-10-CM | POA: Diagnosis not present

## 2015-08-06 DIAGNOSIS — I10 Essential (primary) hypertension: Secondary | ICD-10-CM | POA: Diagnosis not present

## 2015-08-06 DIAGNOSIS — S83282D Other tear of lateral meniscus, current injury, left knee, subsequent encounter: Secondary | ICD-10-CM | POA: Diagnosis not present

## 2015-08-06 DIAGNOSIS — Z833 Family history of diabetes mellitus: Secondary | ICD-10-CM | POA: Diagnosis not present

## 2015-08-06 DIAGNOSIS — M1712 Unilateral primary osteoarthritis, left knee: Secondary | ICD-10-CM | POA: Diagnosis not present

## 2015-08-06 DIAGNOSIS — Z9049 Acquired absence of other specified parts of digestive tract: Secondary | ICD-10-CM | POA: Diagnosis not present

## 2015-08-06 DIAGNOSIS — K219 Gastro-esophageal reflux disease without esophagitis: Secondary | ICD-10-CM | POA: Diagnosis not present

## 2015-08-06 DIAGNOSIS — Z825 Family history of asthma and other chronic lower respiratory diseases: Secondary | ICD-10-CM | POA: Diagnosis not present

## 2015-08-06 DIAGNOSIS — Z8042 Family history of malignant neoplasm of prostate: Secondary | ICD-10-CM | POA: Diagnosis not present

## 2015-08-06 DIAGNOSIS — Y9389 Activity, other specified: Secondary | ICD-10-CM | POA: Diagnosis not present

## 2015-08-06 DIAGNOSIS — S83242D Other tear of medial meniscus, current injury, left knee, subsequent encounter: Secondary | ICD-10-CM | POA: Diagnosis not present

## 2015-08-06 LAB — POTASSIUM: POTASSIUM: 4 mmol/L (ref 3.5–5.1)

## 2015-08-07 NOTE — Pre-Procedure Instructions (Signed)
SENT CHART OVER TO ANESTHESIA FOR REVIEW-DR THOMAS SAID EKG OK-PROCEED WITH SURGERY

## 2015-08-08 ENCOUNTER — Encounter: Payer: Self-pay | Admitting: *Deleted

## 2015-08-08 ENCOUNTER — Ambulatory Visit
Admission: RE | Admit: 2015-08-08 | Discharge: 2015-08-08 | Disposition: A | Discharge status: Home / Self Care | Payer: Medicare Other | Source: Ambulatory Visit | Attending: Surgery | Admitting: Surgery

## 2015-08-08 ENCOUNTER — Ambulatory Visit: Payer: Medicare Other | Admitting: Anesthesiology

## 2015-08-08 ENCOUNTER — Encounter
Admission: RE | Disposition: A | Discharge status: Home / Self Care | Payer: Self-pay | Source: Ambulatory Visit | Attending: Surgery

## 2015-08-08 DIAGNOSIS — Z833 Family history of diabetes mellitus: Secondary | ICD-10-CM | POA: Insufficient documentation

## 2015-08-08 DIAGNOSIS — Z7982 Long term (current) use of aspirin: Secondary | ICD-10-CM | POA: Insufficient documentation

## 2015-08-08 DIAGNOSIS — Z8249 Family history of ischemic heart disease and other diseases of the circulatory system: Secondary | ICD-10-CM | POA: Insufficient documentation

## 2015-08-08 DIAGNOSIS — I10 Essential (primary) hypertension: Secondary | ICD-10-CM | POA: Insufficient documentation

## 2015-08-08 DIAGNOSIS — Z825 Family history of asthma and other chronic lower respiratory diseases: Secondary | ICD-10-CM | POA: Insufficient documentation

## 2015-08-08 DIAGNOSIS — Z803 Family history of malignant neoplasm of breast: Secondary | ICD-10-CM | POA: Insufficient documentation

## 2015-08-08 DIAGNOSIS — E785 Hyperlipidemia, unspecified: Secondary | ICD-10-CM | POA: Insufficient documentation

## 2015-08-08 DIAGNOSIS — M94262 Chondromalacia, left knee: Secondary | ICD-10-CM | POA: Insufficient documentation

## 2015-08-08 DIAGNOSIS — Z8042 Family history of malignant neoplasm of prostate: Secondary | ICD-10-CM | POA: Insufficient documentation

## 2015-08-08 DIAGNOSIS — S83282D Other tear of lateral meniscus, current injury, left knee, subsequent encounter: Secondary | ICD-10-CM | POA: Insufficient documentation

## 2015-08-08 DIAGNOSIS — S83242D Other tear of medial meniscus, current injury, left knee, subsequent encounter: Secondary | ICD-10-CM | POA: Diagnosis not present

## 2015-08-08 DIAGNOSIS — M1712 Unilateral primary osteoarthritis, left knee: Secondary | ICD-10-CM | POA: Diagnosis not present

## 2015-08-08 DIAGNOSIS — Z800 Family history of malignant neoplasm of digestive organs: Secondary | ICD-10-CM | POA: Insufficient documentation

## 2015-08-08 DIAGNOSIS — Z79899 Other long term (current) drug therapy: Secondary | ICD-10-CM | POA: Insufficient documentation

## 2015-08-08 DIAGNOSIS — S83272A Complex tear of lateral meniscus, current injury, left knee, initial encounter: Secondary | ICD-10-CM | POA: Diagnosis not present

## 2015-08-08 DIAGNOSIS — Z9049 Acquired absence of other specified parts of digestive tract: Secondary | ICD-10-CM | POA: Insufficient documentation

## 2015-08-08 DIAGNOSIS — K219 Gastro-esophageal reflux disease without esophagitis: Secondary | ICD-10-CM | POA: Insufficient documentation

## 2015-08-08 DIAGNOSIS — Y9389 Activity, other specified: Secondary | ICD-10-CM | POA: Insufficient documentation

## 2015-08-08 DIAGNOSIS — S83232A Complex tear of medial meniscus, current injury, left knee, initial encounter: Secondary | ICD-10-CM | POA: Diagnosis not present

## 2015-08-08 DIAGNOSIS — L239 Allergic contact dermatitis, unspecified cause: Secondary | ICD-10-CM | POA: Insufficient documentation

## 2015-08-08 HISTORY — DX: Gastro-esophageal reflux disease without esophagitis: K21.9

## 2015-08-08 HISTORY — DX: Diverticulitis of intestine, part unspecified, without perforation or abscess without bleeding: K57.92

## 2015-08-08 HISTORY — PX: KNEE ARTHROSCOPY WITH MENISCAL REPAIR: SHX5653

## 2015-08-08 SURGERY — ARTHROSCOPY, KNEE, WITH MENISCUS REPAIR
Anesthesia: General | Laterality: Left

## 2015-08-08 MED ORDER — LACTATED RINGERS IV SOLN
INTRAVENOUS | Status: DC
  Administered 2015-08-08: 13:00:00 via INTRAVENOUS

## 2015-08-08 MED ORDER — DEXAMETHASONE SODIUM PHOSPHATE 10 MG/ML IJ SOLN
INTRAMUSCULAR | Status: DC | PRN
  Administered 2015-08-08: 10 mg via INTRAVENOUS

## 2015-08-08 MED ORDER — PROPOFOL 10 MG/ML IV BOLUS
INTRAVENOUS | Status: DC | PRN
  Administered 2015-08-08: 150 mg via INTRAVENOUS

## 2015-08-08 MED ORDER — HYDROCODONE-ACETAMINOPHEN 5-325 MG PO TABS: 1.0000 | ORAL_TABLET | Freq: Four times a day (QID) | ORAL | Status: DC | PRN

## 2015-08-08 MED ORDER — LIDOCAINE HCL 1 % IJ SOLN
INTRAMUSCULAR | Status: DC | PRN
  Administered 2015-08-08: 30 mL

## 2015-08-08 MED ORDER — ACETAMINOPHEN 10 MG/ML IV SOLN
INTRAVENOUS | Status: AC
  Filled 2015-08-08: qty 100

## 2015-08-08 MED ORDER — PHENYLEPHRINE HCL 10 MG/ML IJ SOLN
INTRAMUSCULAR | Status: DC | PRN
  Administered 2015-08-08: 100 ug via INTRAVENOUS

## 2015-08-08 MED ORDER — FENTANYL CITRATE (PF) 100 MCG/2ML IJ SOLN
INTRAMUSCULAR | Status: DC | PRN
  Administered 2015-08-08: 50 ug via INTRAVENOUS

## 2015-08-08 MED ORDER — OXYCODONE HCL 5 MG PO TABS
ORAL_TABLET | ORAL | Status: AC
  Filled 2015-08-08: qty 1

## 2015-08-08 MED ORDER — LIDOCAINE HCL (PF) 1 % IJ SOLN
INTRAMUSCULAR | Status: AC
  Filled 2015-08-08: qty 30

## 2015-08-08 MED ORDER — FAMOTIDINE 20 MG PO TABS
ORAL_TABLET | ORAL | Status: AC
  Administered 2015-08-08: 20 mg via ORAL
  Filled 2015-08-08: qty 1

## 2015-08-08 MED ORDER — BUPIVACAINE-EPINEPHRINE (PF) 0.5% -1:200000 IJ SOLN
INTRAMUSCULAR | Status: AC
  Filled 2015-08-08: qty 30

## 2015-08-08 MED ORDER — CEFAZOLIN SODIUM-DEXTROSE 2-3 GM-% IV SOLR
2.0000 g | Freq: Once | INTRAVENOUS | Status: AC
  Administered 2015-08-08: 2 g via INTRAVENOUS

## 2015-08-08 MED ORDER — ONDANSETRON HCL 4 MG/2ML IJ SOLN
INTRAMUSCULAR | Status: DC | PRN
  Administered 2015-08-08: 4 mg via INTRAVENOUS

## 2015-08-08 MED ORDER — BUPIVACAINE-EPINEPHRINE (PF) 0.5% -1:200000 IJ SOLN
INTRAMUSCULAR | Status: DC | PRN
  Administered 2015-08-08: 30 mL via PERINEURAL

## 2015-08-08 MED ORDER — CEFAZOLIN SODIUM-DEXTROSE 2-3 GM-% IV SOLR
INTRAVENOUS | Status: AC
  Filled 2015-08-08: qty 50

## 2015-08-08 MED ORDER — FAMOTIDINE 20 MG PO TABS
20.0000 mg | ORAL_TABLET | Freq: Once | ORAL | Status: AC
  Administered 2015-08-08: 20 mg via ORAL

## 2015-08-08 MED ORDER — LIDOCAINE HCL (CARDIAC) 20 MG/ML IV SOLN
INTRAVENOUS | Status: DC | PRN
  Administered 2015-08-08: 40 mg via INTRAVENOUS

## 2015-08-08 MED ORDER — MIDAZOLAM HCL 5 MG/5ML IJ SOLN
INTRAMUSCULAR | Status: DC | PRN
  Administered 2015-08-08: 2 mg via INTRAVENOUS

## 2015-08-08 MED ORDER — EPHEDRINE SULFATE 50 MG/ML IJ SOLN
INTRAMUSCULAR | Status: DC | PRN
  Administered 2015-08-08 (×2): 10 mg via INTRAVENOUS

## 2015-08-08 MED ORDER — FENTANYL CITRATE (PF) 100 MCG/2ML IJ SOLN: 25.0000 ug | INTRAMUSCULAR | Status: DC | PRN

## 2015-08-08 MED ORDER — OXYCODONE HCL 5 MG PO TABS
5.0000 mg | ORAL_TABLET | Freq: Once | ORAL | Status: AC | PRN
  Administered 2015-08-08: 5 mg via ORAL

## 2015-08-08 MED ORDER — ACETAMINOPHEN 10 MG/ML IV SOLN
INTRAVENOUS | Status: DC | PRN
  Administered 2015-08-08: 1000 mg via INTRAVENOUS

## 2015-08-08 MED ORDER — OXYCODONE HCL 5 MG/5ML PO SOLN: 5.0000 mg | Freq: Once | ORAL | Status: AC | PRN

## 2015-08-08 SURGICAL SUPPLY — 29 items
BAG COUNTER SPONGE EZ (MISCELLANEOUS) IMPLANT
BANDAGE ELASTIC 6 CLIP ST LF (GAUZE/BANDAGES/DRESSINGS) ×3 IMPLANT
BLADE FULL RADIUS 3.5 (BLADE) ×3 IMPLANT
BLADE SHAVER 4.5X7 STR FR (MISCELLANEOUS) ×3 IMPLANT
CHLORAPREP W/TINT 26ML (MISCELLANEOUS) ×3 IMPLANT
COUNTER SPONGE BAG EZ (MISCELLANEOUS)
GAUZE SPONGE 4X4 12PLY STRL (GAUZE/BANDAGES/DRESSINGS) ×3 IMPLANT
GLOVE BIO SURGEON STRL SZ8 (GLOVE) ×6 IMPLANT
GLOVE BIOGEL M 7.0 STRL (GLOVE) ×6 IMPLANT
GLOVE BIOGEL PI IND STRL 7.5 (GLOVE) ×1 IMPLANT
GLOVE BIOGEL PI INDICATOR 7.5 (GLOVE) ×2
GLOVE INDICATOR 8.0 STRL GRN (GLOVE) ×3 IMPLANT
GOWN STRL REUS W/ TWL LRG LVL3 (GOWN DISPOSABLE) ×1 IMPLANT
GOWN STRL REUS W/ TWL XL LVL3 (GOWN DISPOSABLE) ×2 IMPLANT
GOWN STRL REUS W/TWL LRG LVL3 (GOWN DISPOSABLE) ×2
GOWN STRL REUS W/TWL XL LVL3 (GOWN DISPOSABLE) ×4
IV LACTATED RINGER IRRG 3000ML (IV SOLUTION) ×2
IV LR IRRIG 3000ML ARTHROMATIC (IV SOLUTION) ×1 IMPLANT
KIT RM TURNOVER STRD PROC AR (KITS) ×3 IMPLANT
MANIFOLD NEPTUNE II (INSTRUMENTS) ×3 IMPLANT
NEEDLE HYPO 21X1.5 SAFETY (NEEDLE) ×3 IMPLANT
PACK ARTHROSCOPY KNEE (MISCELLANEOUS) ×3 IMPLANT
PAD GROUND ADULT SPLIT (MISCELLANEOUS) ×3 IMPLANT
PENCIL ELECTRO HAND CTR (MISCELLANEOUS) ×3 IMPLANT
SUT PROLENE 4 0 PS 2 18 (SUTURE) ×3 IMPLANT
SUT TI-CRON 2-0 W/10 SWGD (SUTURE) IMPLANT
SYR 50ML LL SCALE MARK (SYRINGE) ×3 IMPLANT
TUBING ARTHRO INFLOW-ONLY STRL (TUBING) ×3 IMPLANT
WAND HAND CNTRL MULTIVAC 90 (MISCELLANEOUS) ×3 IMPLANT

## 2015-08-08 NOTE — H&P (Signed)
Paper H&P to be scanned into permanent record. H&P reviewed. No changes. 

## 2015-08-08 NOTE — Op Note (Signed)
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  Procedure:   Arthroscopic partial medial and lateral meniscectomies with extensive debridement and abrasion chondroplasty of grade 3 chondromalacial changes of all compartments, left knee.  Surgeon:   Pascal Lux, M.D.  Anesthesia:   General LMA.  Findings:   As above. There were degenerative meniscal tears of the anterior and middle portions of the medial meniscus, and of the posterior and anterior portions of the lateral meniscus. There were extensive grade 3 chondromalacial changes involving the femoral trochlea, the medial tibial plateau, and the lateral femoral condyle. There were grade 3-4 chondromalacial changes involving the medial femoral condyle and the lateral tibial plateau. There were grade 2-3 chondromalacial changes involving the patella. The anterior and posterior cruciate ligaments both were in satisfactory condition, other than some attenuation of the anterior cruciate ligament.  Complications:   None.  EBL:   20 cc.  Total fluids:   500 cc of crystalloid.  Tourniquet time:   None  Drains:   None  Closure:   4-0 Prolene interrupted sutures.  Brief clinical note:   The patient is a 74 year old female with a history of medial sided left knee pain following a twisting injury. Her symptoms have persisted despite medications, activity modification, etc. Her history and examination are consistent with a medial meniscus tear as confirmed by MRI scan. The MRI scan also demonstrated fairly extensive tricompartmental degenerative changes.. The patient presents at this time for arthroscopy, debridement, and partial medial meniscectomy.  Procedure:   The patient was brought into the operating room and lain in the supine position. After  adequate general laryngal mask anesthesia was obtained, a timeout was performed to verify the appropriate side. The patient's left knee was injected sterilely using a solution of 30 cc of 1% lidocaine and 30 cc of 0.5% Sensorcaine with epinephrine. The left lower extremity was prepped with ChloraPrep solution before being draped sterilely. Preoperative antibiotics were administered. The expected portal sites were injected with 0.5% Sensorcaine with epinephrine before the camera was placed in the anterolateral portal and instrumentation performed through the anteromedial portal. The knee was sequentially examined beginning in the suprapatellar pouch, then progressing to the patellofemoral space, the medial gutter compartment, the notch, and finally the lateral compartment and gutter. The findings were as described above. Abundant reactive synovial tissues anteriorly were debrided using the full-radius resector in order to improve visualization. The areas of degenerative tearing of the medial and lateral menisci were debrided back to stable margins using the full-radius resector. The remaining rim was carefully probed and found to be intact. In addition, the areas of grade 3 and 3-4 chondromalacia involving all surfaces were debrided back to stable margins using the full-radius resector. Hemostasis was achieved using the ArthroCare wand. The instruments were removed from the joint after suctioning the excess fluid. The portal sites were closed using 4-0 Prolene interrupted sutures before a sterile bulky dressing was applied to the knee. The patient was then awakened, extubated, and returned to the recovery room in satisfactory condition after tolerating the procedure well.

## 2015-08-08 NOTE — Anesthesia Postprocedure Evaluation (Signed)
  Anesthesia Post-op Note  Patient: Vanessa Jones  Procedure(s) Performed: Procedure(s): KNEE ARTHROSCOPY WITH medial and lateral menisectomy (Left)  Anesthesia type:General LMA  Patient location: PACU  Post pain: Pain level controlled  Post assessment: Post-op Vital signs reviewed, Patient's Cardiovascular Status Stable, Respiratory Function Stable, Patent Airway and No signs of Nausea or vomiting  Post vital signs: Reviewed and stable  Last Vitals:  Filed Vitals:   08/08/15 1623  BP: 123/66  Pulse: 78  Temp:   Resp: 16    Level of consciousness: awake, alert  and patient cooperative  Complications: No apparent anesthesia complications

## 2015-08-08 NOTE — Anesthesia Procedure Notes (Signed)
Procedure Name: LMA Insertion Date/Time: 08/08/2015 1:25 PM Performed by: Delaney Meigs Pre-anesthesia Checklist: Patient identified, Emergency Drugs available, Suction available, Patient being monitored and Timeout performed Patient Re-evaluated:Patient Re-evaluated prior to inductionOxygen Delivery Method: Circle system utilized and Simple face mask Preoxygenation: Pre-oxygenation with 100% oxygen Intubation Type: IV induction Ventilation: Mask ventilation without difficulty LMA Size: 3.5 Number of attempts: 1 Placement Confirmation: breath sounds checked- equal and bilateral and positive ETCO2 Tube secured with: Tape Dental Injury: Teeth and Oropharynx as per pre-operative assessment

## 2015-08-08 NOTE — Transfer of Care (Signed)
Immediate Anesthesia Transfer of Care Note  Patient: Vanessa Jones  Procedure(s) Performed: Procedure(s): KNEE ARTHROSCOPY WITH medial and lateral menisectomy (Left)  Patient Location: PACU  Anesthesia Type:General  Level of Consciousness: sedated  Airway & Oxygen Therapy: Patient Spontanous Breathing and Patient connected to face mask oxygen  Post-op Assessment: Report given to RN and Post -op Vital signs reviewed and stable  Post vital signs: Reviewed and stable  Last Vitals:  Filed Vitals:   08/08/15 1423  BP: 91/53  Pulse: 74  Temp: 36.6 C  Resp: 16    Complications: No apparent anesthesia complications

## 2015-08-08 NOTE — Anesthesia Preprocedure Evaluation (Signed)
Anesthesia Evaluation  Patient identified by MRN, date of birth, ID band Patient awake    Reviewed: Allergy & Precautions, H&P , NPO status , Patient's Chart, lab work & pertinent test results  History of Anesthesia Complications Negative for: history of anesthetic complications  Airway Mallampati: III  TM Distance: >3 FB Neck ROM: full    Dental no notable dental hx. (+) Teeth Intact   Pulmonary neg pulmonary ROS,    Pulmonary exam normal breath sounds clear to auscultation       Cardiovascular Exercise Tolerance: Good hypertension, (-) Past MI Normal cardiovascular exam Rhythm:regular Rate:Normal     Neuro/Psych negative neurological ROS  negative psych ROS   GI/Hepatic Neg liver ROS, GERD  Controlled,  Endo/Other  negative endocrine ROS  Renal/GU negative Renal ROS  negative genitourinary   Musculoskeletal  (+) Arthritis ,   Abdominal   Peds  Hematology negative hematology ROS (+)   Anesthesia Other Findings Past Medical History:   Hypertension                                                 Hyperlipidemia                                               Acute back pain                                              Osteoarthritis                                               Allergic dermatitis                                          Allergic rhinitis                                            Diverticulitis                                               GERD (gastroesophageal reflux disease)                       Reproductive/Obstetrics negative OB ROS                             Anesthesia Physical Anesthesia Plan  ASA: III  Anesthesia Plan: General LMA   Post-op Pain Management:    Induction:   Airway Management Planned:   Additional Equipment:   Intra-op Plan:   Post-operative Plan:   Informed Consent: I have reviewed the patients History  and Physical, chart, labs  and discussed the procedure including the risks, benefits and alternatives for the proposed anesthesia with the patient or authorized representative who has indicated his/her understanding and acceptance.   Dental Advisory Given  Plan Discussed with: Anesthesiologist, CRNA and Surgeon  Anesthesia Plan Comments:         Anesthesia Quick Evaluation

## 2015-08-08 NOTE — Discharge Instructions (Signed)

## 2015-08-09 ENCOUNTER — Encounter: Payer: Self-pay | Admitting: Surgery

## 2015-10-04 DIAGNOSIS — Z9889 Other specified postprocedural states: Secondary | ICD-10-CM | POA: Diagnosis not present

## 2015-10-07 ENCOUNTER — Ambulatory Visit (INDEPENDENT_AMBULATORY_CARE_PROVIDER_SITE_OTHER): Payer: Medicare Other | Admitting: Primary Care

## 2015-10-07 VITALS — BP 122/86 | HR 84 | Temp 99.0°F | Ht 65.0 in | Wt 184.0 lb

## 2015-10-07 DIAGNOSIS — R05 Cough: Secondary | ICD-10-CM | POA: Diagnosis not present

## 2015-10-07 DIAGNOSIS — R059 Cough, unspecified: Secondary | ICD-10-CM

## 2015-10-07 MED ORDER — HYDROCODONE-HOMATROPINE 5-1.5 MG/5ML PO SYRP: 5.0000 mL | ORAL_SOLUTION | Freq: Every evening | ORAL | Status: DC | PRN

## 2015-10-07 NOTE — Patient Instructions (Signed)
Day cough: Delsym or Robitussin  Night cough: You may take Hycodan at bedtime for cough and rest.  Ear fullness, nasal congestion: Start taking Claritin daily.  Nasal congestion: Fluticasone (Flonase) nasal spray. Instill 2 sprays in each nostril once daily.  Please call me Thursday if you've had no improvement in your symptoms.  It was a pleasure meeting you!  Upper Respiratory Infection, Adult Most upper respiratory infections (URIs) are a viral infection of the air passages leading to the lungs. A URI affects the nose, throat, and upper air passages. The most common type of URI is nasopharyngitis and is typically referred to as "the common cold." URIs run their course and usually go away on their own. Most of the time, a URI does not require medical attention, but sometimes a bacterial infection in the upper airways can follow a viral infection. This is called a secondary infection. Sinus and middle ear infections are common types of secondary upper respiratory infections. Bacterial pneumonia can also complicate a URI. A URI can worsen asthma and chronic obstructive pulmonary disease (COPD). Sometimes, these complications can require emergency medical care and may be life threatening.  CAUSES Almost all URIs are caused by viruses. A virus is a type of germ and can spread from one person to another.  RISKS FACTORS You may be at risk for a URI if:   You smoke.   You have chronic heart or lung disease.  You have a weakened defense (immune) system.   You are very young or very old.   You have nasal allergies or asthma.  You work in crowded or poorly ventilated areas.  You work in health care facilities or schools. SIGNS AND SYMPTOMS  Symptoms typically develop 2-3 days after you come in contact with a cold virus. Most viral URIs last 7-10 days. However, viral URIs from the influenza virus (flu virus) can last 14-18 days and are typically more severe. Symptoms may include:    Runny or stuffy (congested) nose.   Sneezing.   Cough.   Sore throat.   Headache.   Fatigue.   Fever.   Loss of appetite.   Pain in your forehead, behind your eyes, and over your cheekbones (sinus pain).  Muscle aches.  DIAGNOSIS  Your health care provider may diagnose a URI by:  Physical exam.  Tests to check that your symptoms are not due to another condition such as:  Strep throat.  Sinusitis.  Pneumonia.  Asthma. TREATMENT  A URI goes away on its own with time. It cannot be cured with medicines, but medicines may be prescribed or recommended to relieve symptoms. Medicines may help:  Reduce your fever.  Reduce your cough.  Relieve nasal congestion. HOME CARE INSTRUCTIONS   Take medicines only as directed by your health care provider.   Gargle warm saltwater or take cough drops to comfort your throat as directed by your health care provider.  Use a warm mist humidifier or inhale steam from a shower to increase air moisture. This may make it easier to breathe.  Drink enough fluid to keep your urine clear or pale yellow.   Eat soups and other clear broths and maintain good nutrition.   Rest as needed.   Return to work when your temperature has returned to normal or as your health care provider advises. You may need to stay home longer to avoid infecting others. You can also use a face mask and careful hand washing to prevent spread of the virus.  Increase the usage of your inhaler if you have asthma.   Do not use any tobacco products, including cigarettes, chewing tobacco, or electronic cigarettes. If you need help quitting, ask your health care provider. PREVENTION  The best way to protect yourself from getting a cold is to practice good hygiene.   Avoid oral or hand contact with people with cold symptoms.   Wash your hands often if contact occurs.  There is no clear evidence that vitamin C, vitamin E, echinacea, or exercise  reduces the chance of developing a cold. However, it is always recommended to get plenty of rest, exercise, and practice good nutrition.  SEEK MEDICAL CARE IF:   You are getting worse rather than better.   Your symptoms are not controlled by medicine.   You have chills.  You have worsening shortness of breath.  You have brown or red mucus.  You have yellow or brown nasal discharge.  You have pain in your face, especially when you bend forward.  You have a fever.  You have swollen neck glands.  You have pain while swallowing.  You have white areas in the back of your throat. SEEK IMMEDIATE MEDICAL CARE IF:   You have severe or persistent:  Headache.  Ear pain.  Sinus pain.  Chest pain.  You have chronic lung disease and any of the following:  Wheezing.  Prolonged cough.  Coughing up blood.  A change in your usual mucus.  You have a stiff neck.  You have changes in your:  Vision.  Hearing.  Thinking.  Mood. MAKE SURE YOU:   Understand these instructions.  Will watch your condition.  Will get help right away if you are not doing well or get worse.   This information is not intended to replace advice given to you by your health care provider. Make sure you discuss any questions you have with your health care provider.   Document Released: 05/12/2001 Document Revised: 04/02/2015 Document Reviewed: 02/21/2014 Elsevier Interactive Patient Education [employer].

## 2015-10-07 NOTE — Progress Notes (Signed)
Pre visit review using our clinic review tool, if applicable. No additional management support is needed unless otherwise documented below in the visit note. 

## 2015-10-07 NOTE — Progress Notes (Signed)
Subjective:    Patient ID: Vanessa Jones, female    DOB: [birth year], 74 y.o.   MRN: [id number]  HPI  Vanessa Jones is a 74 year old female who presents today with a chief complaint of nasal congestion.  Her symptoms also include nasal chills, body aches, low grade fever, ear fullness, sinus pressure, headaches. Her symptoms began Thursday last week while traveling in [place]. She's taken tylenol cold and sinus without improvement. She's been around her husband who's been sick with the same thing. Her husband improved with Nyquil alone and is now better.   Review of Systems  Constitutional: Positive for fever and chills.  HENT: Positive for congestion, sinus pressure and sore throat. Negative for ear pain and rhinorrhea.        Ear fullness  Respiratory: Positive for cough. Negative for shortness of breath.   Cardiovascular: Negative for chest pain.  Gastrointestinal: Negative for nausea.  Musculoskeletal: Positive for myalgias.       Past Medical History  Diagnosis Date  . Hypertension   . Hyperlipidemia   . Acute back pain   . Osteoarthritis   . Allergic dermatitis   . Allergic rhinitis   . Diverticulitis   . GERD (gastroesophageal reflux disease)     Social History   Social History  . Marital Status: Married    Spouse Name: N/A  . Number of Children: 2  . Years of Education: N/A   Occupational History  . Herbalist, retired    Social History Main Topics  . Smoking status: Never Smoker   . Smokeless tobacco: Never Used  . Alcohol Use: Yes     Comment: Occ glass of wine  . Drug Use: No  . Sexual Activity: Not on file   Other Topics Concern  . Not on file   Social History Narrative   Retired Herbalist   Married, 2 healthy boys    Regular exercise- yes, walking 3 days a week.   2006 treadmill stress test, low risk.      Moderate diet   No HCOPA, no living will, info given (reviewed 2015)    Past Surgical History  Procedure Laterality Date   . Cholecystectomy    . Hemorrhoid surgery    . Tubal ligation    . Colonoscopy    . Knee arthroscopy with meniscal repair Left 08/08/2015    Procedure: KNEE ARTHROSCOPY WITH medial and lateral menisectomy;  Surgeon: Corky Mull, MD;  Location: [facility] ORS;  Service: Orthopedics;  Laterality: Left;    Family History  Problem Relation Age of Onset  . Hypertension Mother   . Cancer Mother     colon, age 20  . Diabetes Father   . Emphysema Father   . Cancer Sister     breast  . Cancer Brother     bladder  . Coronary artery disease Brother   . Coronary artery disease Maternal Grandmother   . Heart disease Paternal Grandmother     enlarged heart  . Stroke Paternal Grandfather     No Known Allergies  Current Outpatient Prescriptions on File Prior to Visit  Medication Sig Dispense Refill  . acetaminophen (TYLENOL) 500 MG tablet Take 500 mg by mouth every 6 (six) hours as needed.    Marland Kitchen aspirin 81 MG tablet Take 81 mg by mouth as needed.     . CHOLECALCIFEROL PO Take 1 tablet by mouth daily.    . hydrochlorothiazide (HYDRODIURIL) 25 MG tablet TAKE 1/2 TABLET  BY MOUTH EVERY DAY 45 tablet 3  . lisinopril (PRINIVIL,ZESTRIL) 10 MG tablet TAKE 1 TABLET BY MOUTH EVERY DAY (Patient taking differently: TAKE 1 TABLET BY MOUTH EVERY DAY-am) 90 tablet 3  . Acetaminophen (TYLENOL ARTHRITIS PAIN PO) Take 1 tablet by mouth as needed.    Marland Kitchen atorvastatin (LIPITOR) 10 MG tablet Take 1 tablet (10 mg total) by mouth daily. (Patient not taking: Reported on 10/07/2015) 90 tablet 2   No current facility-administered medications on file prior to visit.    BP 122/86 mmHg  Pulse 84  Temp(Src) 99 F (37.2 C) (Oral)  Ht 5\' 5"  (1.651 m)  Wt 184 lb (83.462 kg)  BMI 30.62 kg/m2  SpO2 98%    Objective:   Physical Exam  Constitutional: She appears well-nourished.  HENT:  Right Ear: Ear canal normal.  Left Ear: Ear canal normal.  Nose: Right sinus exhibits maxillary sinus tenderness. Right sinus exhibits  no frontal sinus tenderness. Left sinus exhibits maxillary sinus tenderness. Left sinus exhibits no frontal sinus tenderness.  Mouth/Throat: Posterior oropharyngeal erythema present. No oropharyngeal exudate or posterior oropharyngeal edema.  Dullness to bilateral TM's  Eyes: Conjunctivae are normal. Pupils are equal, round, and reactive to light.  Neck: Neck supple.  Cardiovascular: Normal rate and regular rhythm.   Pulmonary/Chest: Effort normal. She has rhonchi in the right upper field and the left upper field.  Lymphadenopathy:    She has no cervical adenopathy.  Skin: Skin is warm and dry.          Assessment & Plan:  Viral URI:  Nasal congestion, headaches, low grade fevers, body aches x 4 days. Husband recently ill with same symptoms, now improved with Nyquil alone. Exam mostly unremarkable, lungs with mild rhonchi to upper lobes. Suspect viral involvement at this point. Supportive treatment provided including RX for Hycodan for cough and rest HS. Discussed return precautions and she is to call us if no improvement Thursday this week.

## 2015-10-14 DIAGNOSIS — Z9889 Other specified postprocedural states: Secondary | ICD-10-CM | POA: Diagnosis not present

## 2015-10-17 DIAGNOSIS — M25511 Pain in right shoulder: Secondary | ICD-10-CM | POA: Diagnosis not present

## 2015-10-17 DIAGNOSIS — Z9889 Other specified postprocedural states: Secondary | ICD-10-CM | POA: Diagnosis not present

## 2015-10-21 DIAGNOSIS — Z9889 Other specified postprocedural states: Secondary | ICD-10-CM | POA: Diagnosis not present

## 2015-10-23 DIAGNOSIS — Z9889 Other specified postprocedural states: Secondary | ICD-10-CM | POA: Diagnosis not present

## 2015-10-29 DIAGNOSIS — M25511 Pain in right shoulder: Secondary | ICD-10-CM | POA: Diagnosis not present

## 2015-10-29 DIAGNOSIS — Z9889 Other specified postprocedural states: Secondary | ICD-10-CM | POA: Diagnosis not present

## 2015-10-31 DIAGNOSIS — Z9889 Other specified postprocedural states: Secondary | ICD-10-CM | POA: Diagnosis not present

## 2015-10-31 DIAGNOSIS — M25511 Pain in right shoulder: Secondary | ICD-10-CM | POA: Diagnosis not present

## 2015-11-05 DIAGNOSIS — M25562 Pain in left knee: Secondary | ICD-10-CM | POA: Diagnosis not present

## 2015-11-05 DIAGNOSIS — Z9889 Other specified postprocedural states: Secondary | ICD-10-CM | POA: Diagnosis not present

## 2015-11-07 DIAGNOSIS — Z9889 Other specified postprocedural states: Secondary | ICD-10-CM | POA: Diagnosis not present

## 2015-11-07 DIAGNOSIS — M25562 Pain in left knee: Secondary | ICD-10-CM | POA: Diagnosis not present

## 2015-11-12 DIAGNOSIS — M25562 Pain in left knee: Secondary | ICD-10-CM | POA: Diagnosis not present

## 2015-11-12 DIAGNOSIS — Z9889 Other specified postprocedural states: Secondary | ICD-10-CM | POA: Diagnosis not present

## 2015-11-14 DIAGNOSIS — Z9889 Other specified postprocedural states: Secondary | ICD-10-CM | POA: Diagnosis not present

## 2015-11-14 DIAGNOSIS — M25562 Pain in left knee: Secondary | ICD-10-CM | POA: Diagnosis not present

## 2015-11-28 ENCOUNTER — Encounter: Payer: Self-pay | Admitting: *Deleted

## 2016-01-21 ENCOUNTER — Telehealth: Payer: Self-pay | Admitting: Family Medicine

## 2016-01-21 DIAGNOSIS — I10 Essential (primary) hypertension: Secondary | ICD-10-CM

## 2016-01-21 DIAGNOSIS — M8580 Other specified disorders of bone density and structure, unspecified site: Secondary | ICD-10-CM

## 2016-01-21 DIAGNOSIS — E559 Vitamin D deficiency, unspecified: Secondary | ICD-10-CM

## 2016-01-21 NOTE — Telephone Encounter (Signed)
-----   Message from Marchia Bond sent at 01/17/2016  1:06 PM EST ----- Regarding: Cpx labs Thurs 2/23, need orders. Thanks! :-) Please order  future cpx labs for pt's upcoming lab appt. Thanks Aniceto Boss

## 2016-01-23 ENCOUNTER — Other Ambulatory Visit (INDEPENDENT_AMBULATORY_CARE_PROVIDER_SITE_OTHER): Payer: Medicare Other

## 2016-01-23 DIAGNOSIS — E559 Vitamin D deficiency, unspecified: Secondary | ICD-10-CM | POA: Diagnosis not present

## 2016-01-23 DIAGNOSIS — I10 Essential (primary) hypertension: Secondary | ICD-10-CM

## 2016-01-23 LAB — COMPREHENSIVE METABOLIC PANEL
ALT: 11 U/L (ref 0–35)
AST: 18 U/L (ref 0–37)
Albumin: 4.1 g/dL (ref 3.5–5.2)
Alkaline Phosphatase: 84 U/L (ref 39–117)
BUN: 13 mg/dL (ref 6–23)
CHLORIDE: 101 meq/L (ref 96–112)
CO2: 27 meq/L (ref 19–32)
CREATININE: 0.9 mg/dL (ref 0.40–1.20)
Calcium: 9.3 mg/dL (ref 8.4–10.5)
GFR: 65.29 mL/min (ref >60.00)
GLUCOSE: 102 mg/dL — AB (ref 70–99)
Potassium: 3.9 mEq/L (ref 3.5–5.1)
SODIUM: 135 meq/L (ref 135–145)
Total Bilirubin: 0.6 mg/dL (ref 0.2–1.2)
Total Protein: 7 g/dL (ref 6.0–8.3)

## 2016-01-23 LAB — LIPID PANEL
CHOL/HDL RATIO: 3
Cholesterol: 177 mg/dL (ref 0–200)
HDL: 53 mg/dL (ref >39.00)
LDL CALC: 105 mg/dL — AB (ref 0–99)
NonHDL: 124.28
Triglycerides: 94 mg/dL (ref 0.0–149.0)
VLDL: 18.8 mg/dL (ref 0.0–40.0)

## 2016-01-23 LAB — VITAMIN D 25 HYDROXY (VIT D DEFICIENCY, FRACTURES): VITD: 37.97 ng/mL (ref 30.00–100.00)

## 2016-01-30 ENCOUNTER — Ambulatory Visit (INDEPENDENT_AMBULATORY_CARE_PROVIDER_SITE_OTHER): Payer: Medicare Other | Admitting: Family Medicine

## 2016-01-30 VITALS — BP 128/80 | HR 93 | Ht 64.75 in | Wt 185.5 lb

## 2016-01-30 DIAGNOSIS — J309 Allergic rhinitis, unspecified: Secondary | ICD-10-CM | POA: Diagnosis not present

## 2016-01-30 DIAGNOSIS — I10 Essential (primary) hypertension: Secondary | ICD-10-CM | POA: Diagnosis not present

## 2016-01-30 DIAGNOSIS — R7303 Prediabetes: Secondary | ICD-10-CM | POA: Diagnosis not present

## 2016-01-30 DIAGNOSIS — E559 Vitamin D deficiency, unspecified: Secondary | ICD-10-CM

## 2016-01-30 DIAGNOSIS — Z0000 Encounter for general adult medical examination without abnormal findings: Secondary | ICD-10-CM

## 2016-01-30 DIAGNOSIS — Z1231 Encounter for screening mammogram for malignant neoplasm of breast: Secondary | ICD-10-CM

## 2016-01-30 DIAGNOSIS — M1712 Unilateral primary osteoarthritis, left knee: Secondary | ICD-10-CM | POA: Diagnosis not present

## 2016-01-30 DIAGNOSIS — E7800 Pure hypercholesterolemia, unspecified: Secondary | ICD-10-CM

## 2016-01-30 DIAGNOSIS — Z7189 Other specified counseling: Secondary | ICD-10-CM

## 2016-01-30 DIAGNOSIS — M8580 Other specified disorders of bone density and structure, unspecified site: Secondary | ICD-10-CM

## 2016-01-30 NOTE — Assessment & Plan Note (Signed)
Can use claritin or can try flonase to avoid SE.

## 2016-01-30 NOTE — Assessment & Plan Note (Signed)
No HCOPA, no living will, info given again (reviewed 2017)

## 2016-01-30 NOTE — Assessment & Plan Note (Signed)
Lower carbs in diet. Stop OJ.

## 2016-01-30 NOTE — Progress Notes (Signed)
I have personally reviewed the Medicare Annual Wellness questionnaire and have noted 1. The patient's medical and social history 2. Their use of alcohol, tobacco or illicit drugs 3. Their current medications and supplements 4. The patient's functional ability including ADL's, fall risks, home safety risks and hearing or visual             impairment. 5. Diet and physical activities 6. Evidence for depression or mood disorders 7.         Updated provider list Cognitive evaluation was performed and recorded on pt medicare questionnaire form. The patients weight, height, BMI and visual acuity have been recorded in the chart  I have made referrals, counseling and provided education to the patient based review of the above and I have provided the pt with a written personalized care plan for preventive services.    She has been having nasal congestion x 3 weeks. No ear pain. Some ringing in ear today. No sinus pain, not pressure.  Claritin helps, but makes her tired.  No SOB, no CP.  Left knee pain, arthroscopy with Dr. Claudean Kinds a lot of arthritis. She is avoiding NSAIDs given triggered diverticulitits.  Has not been exercising lately.  Eating regularly but moderate fat and carb intake.  Elevated Cholesterol: At goal < 130 off  lipitor 10 mg every other daily. Lab Results  Component Value Date   CHOL 177 01/23/2016   HDL 53.00 01/23/2016   LDLCALC 105* 01/23/2016   TRIG 94.0 01/23/2016   CHOLHDL 3 01/23/2016  Using medications without problems:None  Muscle aches: None  Other complaints:    Prediabetes: new.   Hypertension: Well controlled on HCTZ and lisinopril  BP Readings from Last 3 Encounters:  01/30/16 128/80  10/07/15 122/86  08/08/15 123/66  Using medication without problems or lightheadedness: none Chest pain with exertion:None  Edema:None  she feels tired occ She continues to be slightly dyspneic but not any worse. Average home BPs: Not checking.   Other issues:   Vit D resolved on supplement.  Volunteering at hospice.  Review of Systems  Constitutional: Negative for fever, fatigue and unexpected weight change.  HENT: Negative for congestion and tinnitus.  Eyes: Negative for pain.  Respiratory: Negative for cough, shortness of breath and wheezing.  Cardiovascular: Negative for chest pain, palpitations and leg swelling.  Gastrointestinal: Negative for abdominal pain, diarrhea, constipation and blood in stool.  Genitourinary: Negative for dysuria and hematuria.  Neurological: Negative for syncope, weakness and numbness.  No falls.  Objective:   Physical Exam  Constitutional: Vital signs are normal. She appears well-developed and well-nourished. She is cooperative. Non-toxic appearance. She does not appear ill. No distress.  HENT:  Head: Normocephalic.  Right Ear: Hearing, tympanic membrane, external ear and ear canal normal.  Left Ear: Hearing, tympanic membrane, external ear and ear canal normal.  Nose: Nose normal.  Eyes: Conjunctivae, EOM and lids are normal. Pupils are equal, round, and reactive to light. No foreign bodies found.  Neck: Trachea normal and normal range of motion. Neck supple. Carotid bruit is not present. No mass and no thyromegaly present.  Cardiovascular: Normal rate, regular rhythm, S1 normal, S2 normal, normal heart sounds and intact distal pulses. Exam reveals no gallop.  No murmur heard.  Pulmonary/Chest: Effort normal and breath sounds normal. No respiratory distress. She has no wheezes. She has no rhonchi. She has no rales.  Abdominal: Soft. Normal appearance and bowel sounds are normal. She exhibits no distension, no fluid wave, no abdominal  bruit and no mass. There is no hepatosplenomegaly. There is no tenderness. There is no rebound, no guarding and no CVA tenderness. No hernia.  Genitourinary: No breast swelling, tenderness, discharge or bleeding. Pelvic exam was performed with  patient prone.  Musculoskeletal:  Lymphadenopathy:  She has no cervical adenopathy.  She has no axillary adenopathy.  Neurological: She is alert. She has normal strength. No cranial nerve deficit or sensory deficit.  Skin: Skin is warm, dry and intact Psychiatric: Her speech is normal and behavior is normal. Judgment normal. Her mood appears not anxious. Cognition and memory are normal. She does not exhibit a depressed mood.    Assessment and Plan:  The patient's preventative maintenance and recommended screening tests for an annual wellness exam were reviewed in full today.  Brought up to date unless services declined.  Counselled on the importance of diet, exercise, and its role in overall health and mortality.  The patient's FH and SH was reviewed, including their home life, tobacco status, and drug and alcohol status .  Colon: 01/2012 Dr. Gustavo Lah, [facility], polyps removed, repeat in 3 years. Family history of colon cancer in mother  PAP/DVE: PAP not indicated due to age. Has decided DVE, no symptoms, no family history of uterine and ovarian cancer.  DEXA: 07/2013 spine osteopenia, unchanged, due  Now. Mammogram: Last 07/2013, will schedule,sister with breast cancer. Vaccines: Td and PNA,  shingles vaccine uptodate. Refused flu.

## 2016-01-30 NOTE — Assessment & Plan Note (Signed)
Well controlled. Continue current medication.  

## 2016-01-30 NOTE — Assessment & Plan Note (Signed)
Resolved on vit D supplement.

## 2016-01-30 NOTE — Assessment & Plan Note (Signed)
S/P PT.  S/p arthrocopy.  Hesitant about NSAIDS.  Will try trial of glucosamine.

## 2016-01-30 NOTE — Progress Notes (Signed)
Pre visit review using our clinic review tool, if applicable. No additional management support is needed unless otherwise documented below in the visit note. 

## 2016-01-30 NOTE — Assessment & Plan Note (Signed)
Stable control off lipitor. Follow.

## 2016-01-30 NOTE — Patient Instructions (Addendum)
Flonase 2 sprays per nostril daily.  Can continue claritin. Try OTC glucosamine 500 mg three times daily for arthritis in knees.  Call if not improving as expected.. For further treatment option.  Low carb diet, stop juice.  Call Dr. Gustavo Lah a call about setting up colonoscopy or further eval.  Schedule mammo and bone density.

## 2016-02-03 ENCOUNTER — Encounter: Payer: Self-pay | Admitting: Family Medicine

## 2016-02-03 DIAGNOSIS — R921 Mammographic calcification found on diagnostic imaging of breast: Secondary | ICD-10-CM | POA: Diagnosis not present

## 2016-02-03 DIAGNOSIS — N951 Menopausal and female climacteric states: Secondary | ICD-10-CM | POA: Diagnosis not present

## 2016-02-03 DIAGNOSIS — Z1231 Encounter for screening mammogram for malignant neoplasm of breast: Secondary | ICD-10-CM | POA: Diagnosis not present

## 2016-02-03 DIAGNOSIS — E2839 Other primary ovarian failure: Secondary | ICD-10-CM | POA: Diagnosis not present

## 2016-02-29 ENCOUNTER — Other Ambulatory Visit: Payer: Self-pay | Admitting: Family Medicine

## 2016-08-18 DIAGNOSIS — R0982 Postnasal drip: Secondary | ICD-10-CM | POA: Diagnosis not present

## 2016-08-18 DIAGNOSIS — K116 Mucocele of salivary gland: Secondary | ICD-10-CM | POA: Diagnosis not present

## 2016-08-18 DIAGNOSIS — R51 Headache: Secondary | ICD-10-CM | POA: Diagnosis not present

## 2016-08-18 DIAGNOSIS — H903 Sensorineural hearing loss, bilateral: Secondary | ICD-10-CM | POA: Diagnosis not present

## 2016-11-10 ENCOUNTER — Other Ambulatory Visit: Payer: Self-pay | Admitting: Family Medicine

## 2016-12-04 IMAGING — CT CT ABD-PELV W/ CM
2 of 5 series · 17 of 46 positions shown, 19 images · IV contrast (Omnipaque 300)
Comparison: None.

CLINICAL DATA: 73-year-old female with low abdominal cramping

EXAM:
CT ABDOMEN AND PELVIS WITH CONTRAST
TECHNIQUE: Multidetector CT imaging of the abdomen and pelvis was performed
using the standard protocol following bolus administration of
intravenous contrast.
CONTRAST:  100mL OMNIPAQUE IOHEXOL 300 MG/ML  SOLN

[Series 2: abd/ pel 5mm · axial · 0.72mm/px · z∈[-408,+2]mm · 14 of 92 slices shown, 16 images]
[im 5/92  soft-tissue]
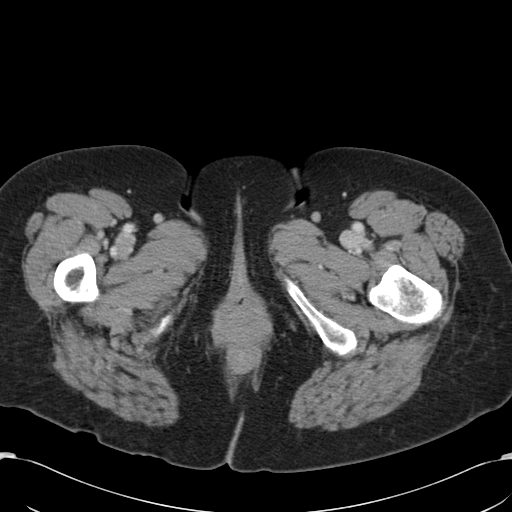
[im 5/92  bone]
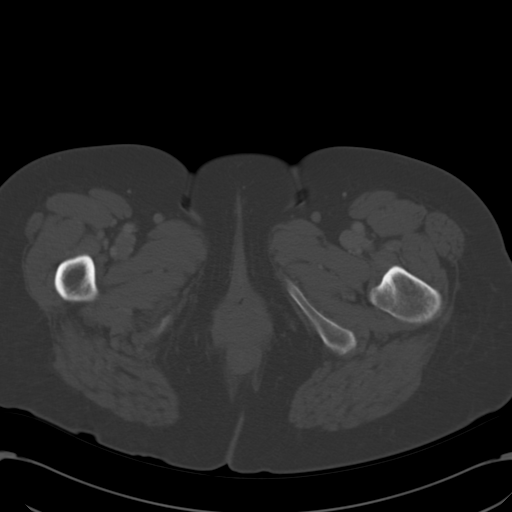
[im 10/92  soft-tissue]
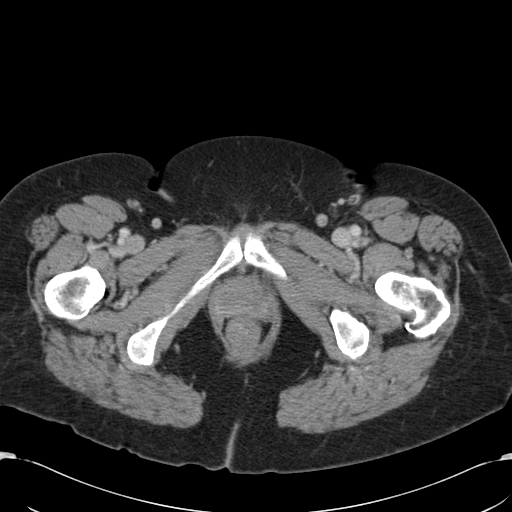
[im 20/92  soft-tissue]
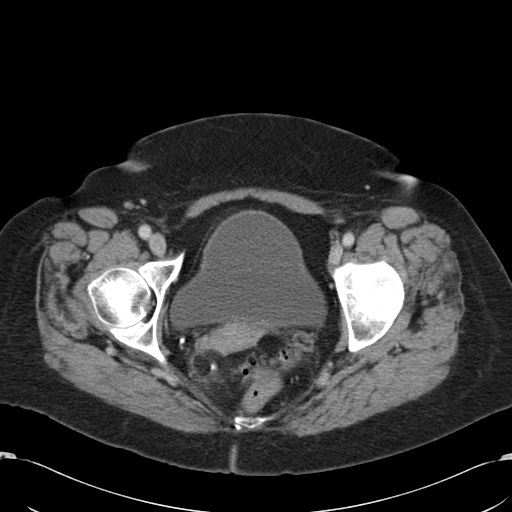
[im 24/92  soft-tissue]
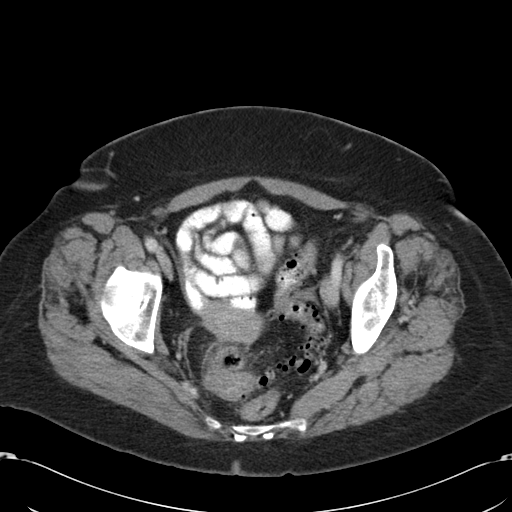
[im 29/92  soft-tissue]
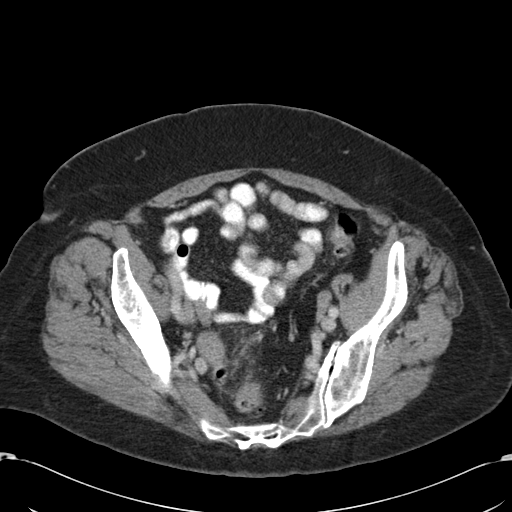
[im 39/92  soft-tissue]
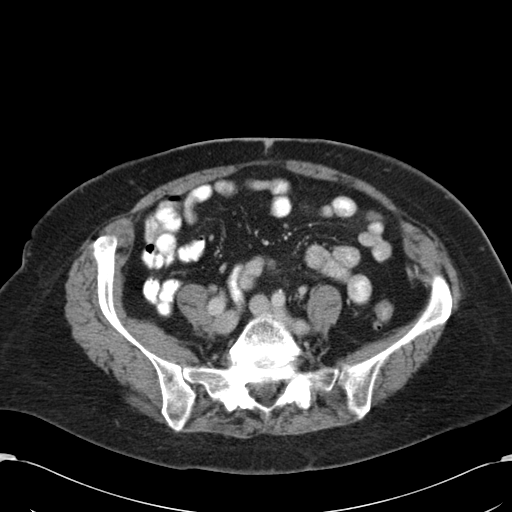
[im 44/92  soft-tissue]
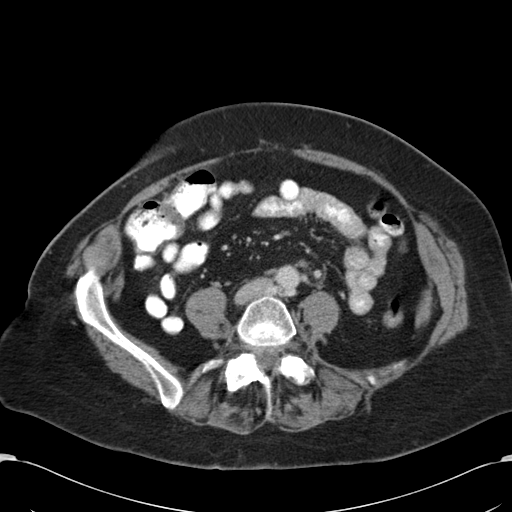
[im 48/92  soft-tissue]
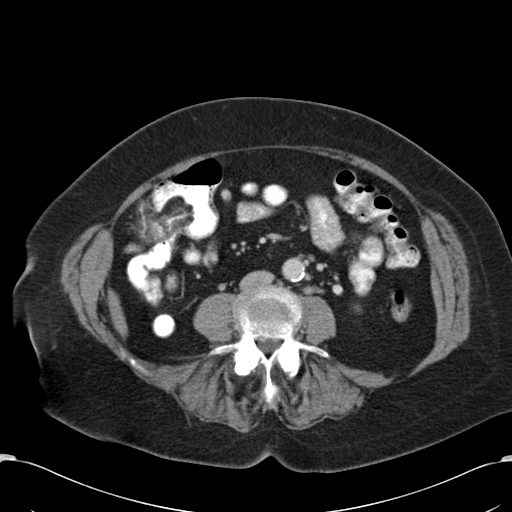
[im 53/92  soft-tissue]
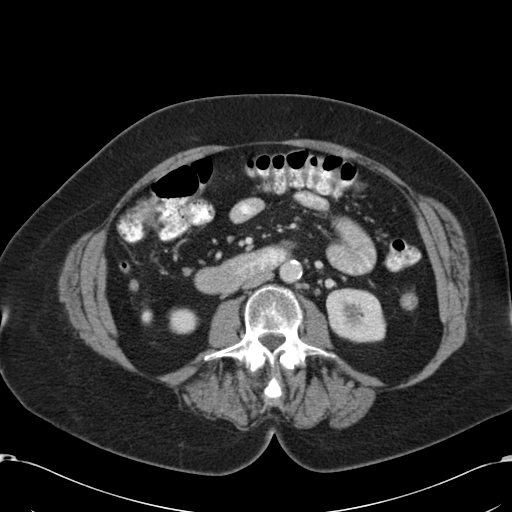
[im 53/92  bone]
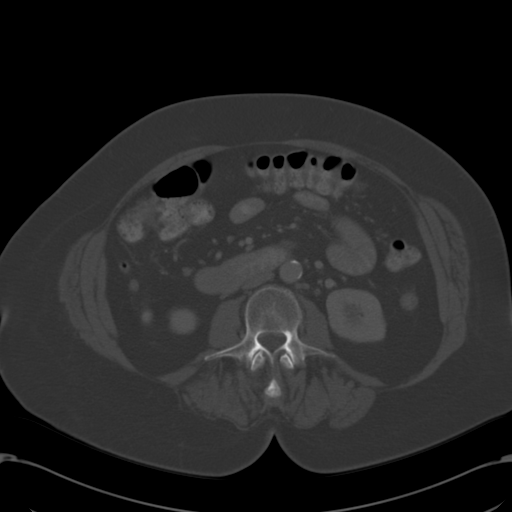
[im 63/92  soft-tissue]
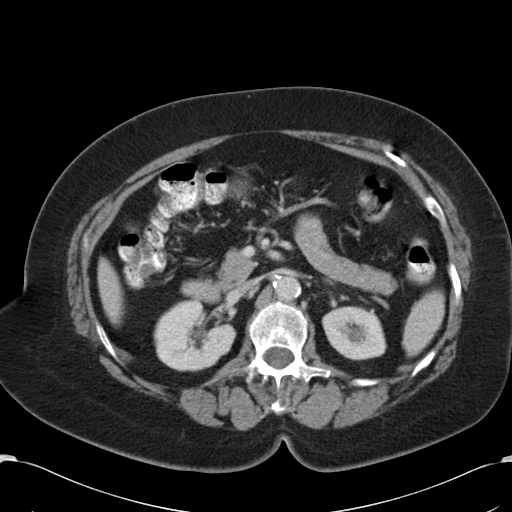
[im 68/92  soft-tissue]
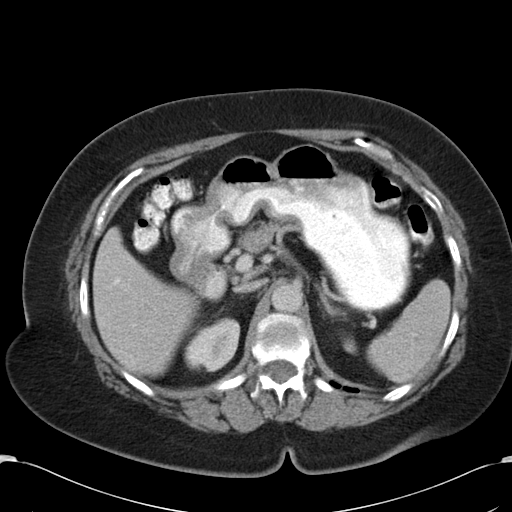
[im 72/92  soft-tissue]
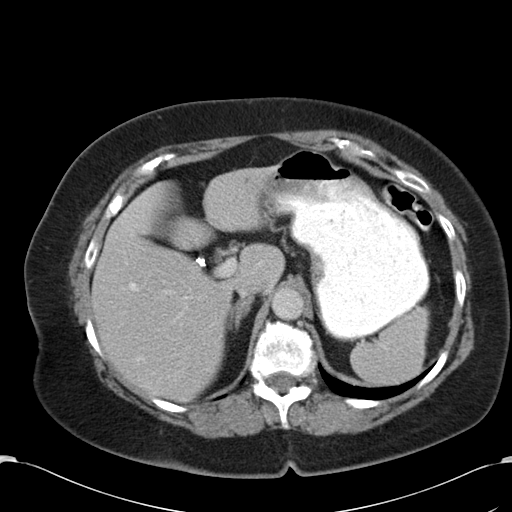
[im 82/92  soft-tissue]
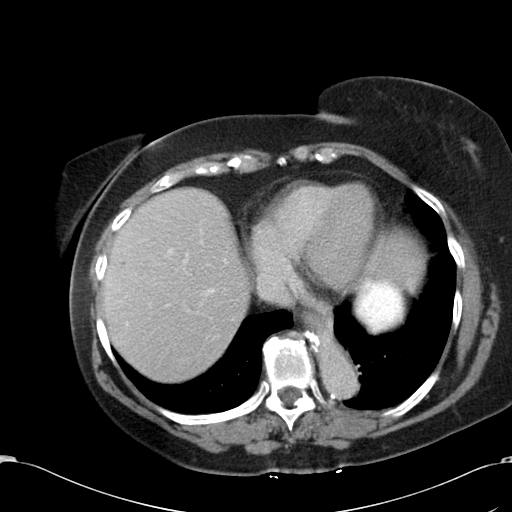
[im 87/92  soft-tissue]
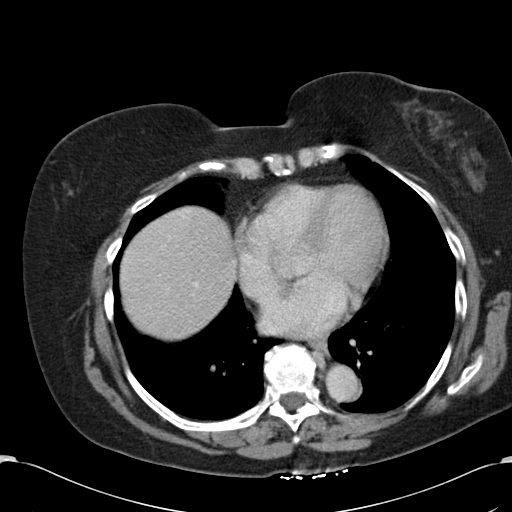

[Series 602: cor · coronal · 0.92mm/px · 3 of 125 slices shown]
[im 42/125  soft-tissue]
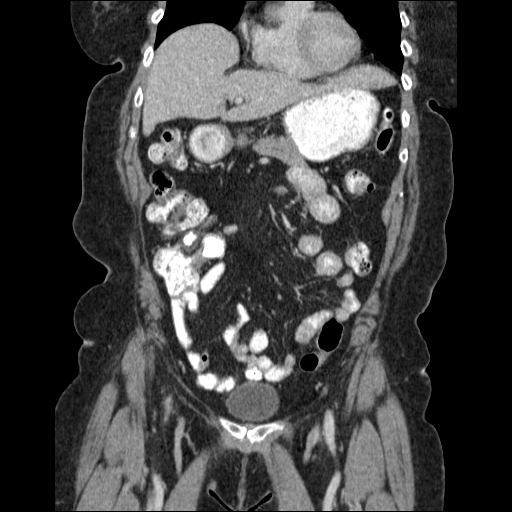
[im 56/125  soft-tissue]
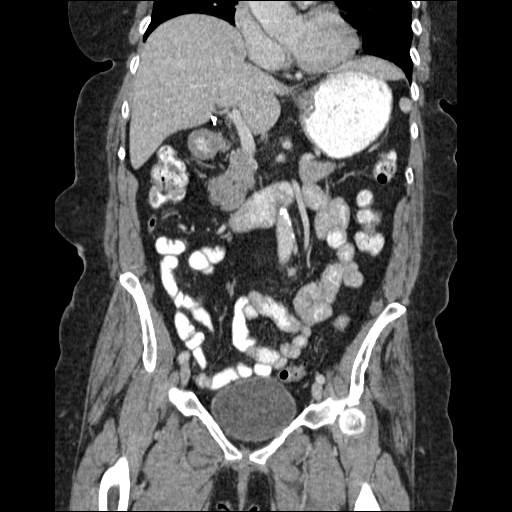
[im 69/125  soft-tissue]
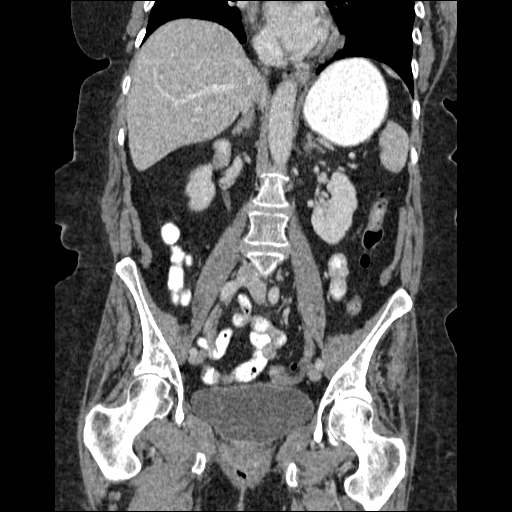

[17 of 46 positions shown; findings below may reference images not displayed]

FINDINGS: Lower chest:

Unremarkable appearance of the soft tissues of the chest wall.

Heart size within normal limits.  No pericardial fluid/thickening.

No lower mediastinal adenopathy.

Unremarkable appearance of the distal esophagus.

No hiatal hernia.

No confluent airspace disease, pleural fluid, or pneumothorax within
visualized lung.

Abdomen/pelvis:

Unremarkable liver. Unremarkable spleen. Unremarkable bilateral
adrenal glands.

Unremarkable pancreas.

Surgical changes of cholecystectomy.

Enteric contrast extends through the small bowel and colon without
abnormal distention or transition point.

Extensive sigmoid diverticular disease. Inflammatory changes are
present within the sigmoid mesocolon of the low posterior pelvis. No
abscess collection. No extraluminal gas.

Normal appendix.

Unremarkable appearance of the bilateral kidneys. Unremarkable
appearance of the course of the bilateral ureters.

Calcifications of the abdominal aorta without aneurysm. No
dissection flap. Tortuosity of the iliac system.

Unremarkable appearance of the uterus and adnexa.

No acute fracture. Multilevel degenerative changes of the
thoracolumbar spine. No significant bony canal narrowing.
IMPRESSION: Acute diverticulitis of the sigmoid colon within the low pelvis. No
complicating features.

Atherosclerosis.

Additional incidental findings as above.

These results were called by telephone at the time of interpretation
on 04/18/2015 at [DATE] to Ms Plaisil Noel Magdala, who verbally
acknowledged these results.

## 2016-12-10 ENCOUNTER — Ambulatory Visit (INDEPENDENT_AMBULATORY_CARE_PROVIDER_SITE_OTHER): Payer: Medicare Other | Admitting: Family Medicine

## 2016-12-10 ENCOUNTER — Encounter: Payer: Self-pay | Admitting: Family Medicine

## 2016-12-10 VITALS — BP 120/84 | HR 89 | Temp 98.4°F | Ht 64.75 in | Wt 189.5 lb

## 2016-12-10 DIAGNOSIS — N3000 Acute cystitis without hematuria: Secondary | ICD-10-CM

## 2016-12-10 DIAGNOSIS — R300 Dysuria: Secondary | ICD-10-CM | POA: Diagnosis not present

## 2016-12-10 LAB — POC URINALSYSI DIPSTICK (AUTOMATED)
BILIRUBIN UA: NEGATIVE
GLUCOSE UA: NEGATIVE
KETONES UA: NEGATIVE
Nitrite, UA: NEGATIVE
PH UA: 6
Protein, UA: NEGATIVE
RBC UA: NEGATIVE
SPEC GRAV UA: 1.025
Urobilinogen, UA: 0.2

## 2016-12-10 MED ORDER — SULFAMETHOXAZOLE-TRIMETHOPRIM 800-160 MG PO TABS: 1.0000 | ORAL_TABLET | Freq: Two times a day (BID) | ORAL | 0 refills | Status: DC

## 2016-12-10 NOTE — Progress Notes (Signed)
Pre visit review using our clinic review tool, if applicable. No additional management support is needed unless otherwise documented below in the visit note. 

## 2016-12-10 NOTE — Progress Notes (Signed)
   Subjective:    Patient ID: Vanessa Jones, female    DOB: 06-26-1941, 76 y.o.   MRN: [id number]  Dysuria   This is a new problem. The current episode started in the past 7 days. The problem occurs every urination. The problem has been gradually worsening. The quality of the pain is described as burning. The pain is moderate. There has been no fever. She is not sexually active. There is no history of pyelonephritis. Associated symptoms include frequency. Pertinent negatives include no chills, flank pain, hematuria, nausea, urgency or vomiting. She has tried increased fluids ( cranberry) for the symptoms. The treatment provided mild relief. Her past medical history is significant for recurrent UTIs. There is no history of catheterization, kidney stones, a single kidney, urinary stasis or a urological procedure.    No recent UTI.   Review of Systems  Constitutional: Negative for chills.  Gastrointestinal: Negative for nausea and vomiting.  Genitourinary: Positive for dysuria and frequency. Negative for flank pain, hematuria and urgency.    lower abdominal heaviness.    Objective:   Physical Exam  Constitutional: Vital signs are normal. She appears well-developed and well-nourished. She is cooperative.  Non-toxic appearance. She does not appear ill. No distress.  HENT:  Head: Normocephalic.  Right Ear: Hearing, tympanic membrane, external ear and ear canal normal. Tympanic membrane is not erythematous, not retracted and not bulging.  Left Ear: Hearing, tympanic membrane, external ear and ear canal normal. Tympanic membrane is not erythematous, not retracted and not bulging.  Nose: No mucosal edema or rhinorrhea. Right sinus exhibits no maxillary sinus tenderness and no frontal sinus tenderness. Left sinus exhibits no maxillary sinus tenderness and no frontal sinus tenderness.  Mouth/Throat: Uvula is midline, oropharynx is clear and moist and mucous membranes are normal.  Eyes: Conjunctivae,  EOM and lids are normal. Pupils are equal, round, and reactive to light. Lids are everted and swept, no foreign bodies found.  Neck: Trachea normal and normal range of motion. Neck supple. Carotid bruit is not present. No thyroid mass and no thyromegaly present.  Cardiovascular: Normal rate, regular rhythm, S1 normal, S2 normal, normal heart sounds, intact distal pulses and normal pulses.  Exam reveals no gallop and no friction rub.   No murmur heard. Pulmonary/Chest: Effort normal and breath sounds normal. No tachypnea. No respiratory distress. She has no decreased breath sounds. She has no wheezes. She has no rhonchi. She has no rales.  Abdominal: Soft. Normal appearance and bowel sounds are normal. There is no hepatosplenomegaly. There is no tenderness. There is no CVA tenderness.  Neurological: She is alert.  Skin: Skin is warm, dry and intact. No rash noted.  Psychiatric: Her speech is normal and behavior is normal. Judgment and thought content normal. Her mood appears not anxious. Cognition and memory are normal. She does not exhibit a depressed mood.          Assessment & Plan:

## 2016-12-10 NOTE — Patient Instructions (Addendum)
Push fluids.  Complete antibiotics.  Let us know if symptoms are not improving as expected.  Go to ER if fever on antibiotics or cannot keep them down because of vomiting.

## 2016-12-10 NOTE — Assessment & Plan Note (Signed)
Uncomplicated.  Send for culture and treat with bactrim x 3 days.

## 2016-12-13 LAB — URINE CULTURE

## 2017-01-21 ENCOUNTER — Telehealth: Payer: Self-pay | Admitting: Family Medicine

## 2017-01-21 DIAGNOSIS — M8589 Other specified disorders of bone density and structure, multiple sites: Secondary | ICD-10-CM

## 2017-01-21 DIAGNOSIS — E559 Vitamin D deficiency, unspecified: Secondary | ICD-10-CM

## 2017-01-21 DIAGNOSIS — E7800 Pure hypercholesterolemia, unspecified: Secondary | ICD-10-CM

## 2017-01-21 DIAGNOSIS — R7303 Prediabetes: Secondary | ICD-10-CM

## 2017-01-21 NOTE — Telephone Encounter (Signed)
-----   Message from Ellamae Sia sent at 01/19/2017 10:52 AM EST ----- Regarding: Lab orders for Friday, 2.27.18 Patient is scheduled for CPX labs, please order future labs, Thanks , Karna Christmas

## 2017-01-26 ENCOUNTER — Other Ambulatory Visit (INDEPENDENT_AMBULATORY_CARE_PROVIDER_SITE_OTHER): Payer: Medicare Other

## 2017-01-26 DIAGNOSIS — R7303 Prediabetes: Secondary | ICD-10-CM

## 2017-01-26 DIAGNOSIS — E559 Vitamin D deficiency, unspecified: Secondary | ICD-10-CM | POA: Diagnosis not present

## 2017-01-26 DIAGNOSIS — E7800 Pure hypercholesterolemia, unspecified: Secondary | ICD-10-CM

## 2017-01-26 LAB — COMPREHENSIVE METABOLIC PANEL
ALT: 7 U/L (ref 0–35)
AST: 14 U/L (ref 0–37)
Albumin: 4.3 g/dL (ref 3.5–5.2)
Alkaline Phosphatase: 75 U/L (ref 39–117)
BILIRUBIN TOTAL: 0.3 mg/dL (ref 0.2–1.2)
BUN: 15 mg/dL (ref 6–23)
CHLORIDE: 104 meq/L (ref 96–112)
CO2: 27 meq/L (ref 19–32)
Calcium: 9.5 mg/dL (ref 8.4–10.5)
Creatinine, Ser: 0.98 mg/dL (ref 0.40–1.20)
GFR: 59.02 mL/min — ABNORMAL LOW (ref >60.00)
GLUCOSE: 89 mg/dL (ref 70–99)
Potassium: 4.1 mEq/L (ref 3.5–5.1)
Sodium: 140 mEq/L (ref 135–145)
Total Protein: 7 g/dL (ref 6.0–8.3)

## 2017-01-26 LAB — LIPID PANEL
CHOLESTEROL: 212 mg/dL — AB (ref 0–200)
HDL: 57 mg/dL (ref >39.00)
LDL Cholesterol: 128 mg/dL — ABNORMAL HIGH (ref 0–99)
NonHDL: 155.13
Total CHOL/HDL Ratio: 4
Triglycerides: 134 mg/dL (ref 0.0–149.0)
VLDL: 26.8 mg/dL (ref 0.0–40.0)

## 2017-01-26 LAB — HEMOGLOBIN A1C: HEMOGLOBIN A1C: 6.1 % (ref 4.6–6.5)

## 2017-01-26 LAB — VITAMIN D 25 HYDROXY (VIT D DEFICIENCY, FRACTURES): VITD: 34.88 ng/mL (ref 30.00–100.00)

## 2017-02-02 ENCOUNTER — Ambulatory Visit (INDEPENDENT_AMBULATORY_CARE_PROVIDER_SITE_OTHER): Payer: Medicare Other | Admitting: Family Medicine

## 2017-02-02 ENCOUNTER — Encounter: Payer: Self-pay | Admitting: Family Medicine

## 2017-02-02 VITALS — BP 124/82 | HR 82 | Temp 98.0°F | Ht 64.75 in | Wt 192.2 lb

## 2017-02-02 DIAGNOSIS — Z1211 Encounter for screening for malignant neoplasm of colon: Secondary | ICD-10-CM

## 2017-02-02 DIAGNOSIS — I10 Essential (primary) hypertension: Secondary | ICD-10-CM | POA: Diagnosis not present

## 2017-02-02 DIAGNOSIS — R7303 Prediabetes: Secondary | ICD-10-CM

## 2017-02-02 DIAGNOSIS — E559 Vitamin D deficiency, unspecified: Secondary | ICD-10-CM

## 2017-02-02 DIAGNOSIS — Z800 Family history of malignant neoplasm of digestive organs: Secondary | ICD-10-CM | POA: Diagnosis not present

## 2017-02-02 DIAGNOSIS — E7800 Pure hypercholesterolemia, unspecified: Secondary | ICD-10-CM | POA: Diagnosis not present

## 2017-02-02 DIAGNOSIS — Z0000 Encounter for general adult medical examination without abnormal findings: Secondary | ICD-10-CM | POA: Diagnosis not present

## 2017-02-02 NOTE — Addendum Note (Signed)
Addended byEliezer Lofts E on: 02/02/2017 11:10 AM   Modules accepted: Orders

## 2017-02-02 NOTE — Assessment & Plan Note (Signed)
Refused referral to nutritionist, refused statin at this time.. Wants to work on diet... Not currently interested in restarting statin.

## 2017-02-02 NOTE — Assessment & Plan Note (Signed)
Encouraged exercise, weight loss, healthy eating habits. ? ?

## 2017-02-02 NOTE — Progress Notes (Signed)
Subjective:    Patient ID: Vanessa Jones, female    DOB: Oct 17, 1941, 76 y.o.   MRN: [id number]  HPI I have personally reviewed the Medicare Annual Wellness questionnaire and have noted 1. The patient's medical and social history 2. Their use of alcohol, tobacco or illicit drugs 3. Their current medications and supplements 4. The patient's functional ability including ADL's, fall risks, home safety risks and hearing or visual             impairment. 5. Diet and physical activities 6. Evidence for depression or mood disorders 7.         Updated provider list Cognitive evaluation was performed and recorded on pt medicare questionnaire form. The patients weight, height, BMI and visual acuity have been recorded in the chart  I have made referrals, counseling and provided education to the patient based review of the above and I have provided the pt with a written personalized care plan for preventive services.   Elevated Cholesterol:  No longer on statin.  16.3 % risk. Recommended risk. Lab Results  Component Value Date   CHOL 212 (H) 01/26/2017   HDL 57.00 01/26/2017   LDLCALC 128 (H) 01/26/2017   TRIG 134.0 01/26/2017   CHOLHDL 4 01/26/2017  Using medications without problems: Muscle aches:  Diet compliance: Exercise:  Weekly, left  Knee stays swollen, since surgery, limits her some  Considering water exercise. Other complaints:  Prediabetes: improved FBS Lab Results  Component Value Date   HGBA1C 6.1 01/26/2017     Hypertension:   Well controlled on lisinopril and HCTZ.  BP Readings from Last 3 Encounters:  02/02/17 124/82  12/10/16 120/84  01/30/16 128/80  Using medication without problems or lightheadedness:  Chest pain with exertion: none Edema: in left knee Short of breath: only with hanging drape not with exercise Average home BPs: Other issues:   Social History /Family History/Past Medical History reviewed and updated if needed. Blood pressure 124/82,  pulse 82, temperature 98 F (36.7 C), temperature source Oral, height 5' 4.75" (1.645 m), weight 192 lb 4 oz (87.2 kg).  Review of Systems  Constitutional: Positive for fatigue.  Respiratory: Positive for shortness of breath.         Has noted more shortness of breath  When she is trying to having picture.  No increased issue sat gym.  Cardiovascular: Negative for chest pain, palpitations and leg swelling.       Objective:   Physical Exam  Constitutional: Vital signs are normal. She appears well-developed and well-nourished. She is cooperative.  Non-toxic appearance. She does not appear ill. No distress.  HENT:  Head: Normocephalic.  Right Ear: Hearing, tympanic membrane, external ear and ear canal normal.  Left Ear: Hearing, tympanic membrane, external ear and ear canal normal.  Nose: Nose normal.  Eyes: Conjunctivae, EOM and lids are normal. Pupils are equal, round, and reactive to light. Lids are everted and swept, no foreign bodies found.  Neck: Trachea normal and normal range of motion. Neck supple. Carotid bruit is not present. No thyroid mass and no thyromegaly present.  Cardiovascular: Normal rate, regular rhythm, S1 normal, S2 normal, normal heart sounds and intact distal pulses.  Exam reveals no gallop.   No murmur heard. Pulmonary/Chest: Effort normal and breath sounds normal. No respiratory distress. She has no wheezes. She has no rhonchi. She has no rales.  Abdominal: Soft. Normal appearance and bowel sounds are normal. She exhibits no distension, no fluid wave, no abdominal bruit and  no mass. There is no hepatosplenomegaly. There is no tenderness. There is no rebound, no guarding and no CVA tenderness. No hernia.  Musculoskeletal:  Left knee decrease ROM and diffuse swelling, joint line ttp  Lymphadenopathy:    She has no cervical adenopathy.    She has no axillary adenopathy.  Neurological: She is alert. She has normal strength. No cranial nerve deficit or sensory  deficit.  Skin: Skin is warm, dry and intact. No rash noted.  Psychiatric: Her speech is normal and behavior is normal. Judgment normal. Her mood appears not anxious. Cognition and memory are normal. She does not exhibit a depressed mood.          Assessment & Plan:  The patient's preventative maintenance and recommended screening tests for an annual wellness exam were reviewed in full today. Brought up to date unless services declined.  Counselled on the importance of diet, exercise, and its role in overall health and mortality. The patient's FH and SH was reviewed, including their home life, tobacco status, and drug and alcohol status.   Colon: 01/2012 Dr. Gustavo Lah, [facility], repeat in 3 years. Family history of colon cancer in mother .  Will refer to  this year per pt request. PAP/DVE: PAP not indicated due to age. Has decided no DVE, no symptoms, no family history of uterine and ovarian cancer.  DEXA: 2017 normal improved Mammogram: Last 2017,sister with breast cancer. Vaccines: Td and PNA,  shingles vaccine uptodate. Refused flu.

## 2017-02-02 NOTE — Assessment & Plan Note (Signed)
Improved

## 2017-02-02 NOTE — Assessment & Plan Note (Signed)
Resolve on supplement daily. Continue.

## 2017-02-02 NOTE — Patient Instructions (Addendum)
Consider water exercise. Work on low cholesterol , low carb diet. Please stop at the front desk to set up referral. Schedule mammogram on your own.

## 2017-02-02 NOTE — Progress Notes (Signed)
Pre visit review using our clinic review tool, if applicable. No additional management support is needed unless otherwise documented below in the visit note. 

## 2017-03-30 DIAGNOSIS — Z86010 Personal history of colonic polyps: Secondary | ICD-10-CM | POA: Diagnosis not present

## 2017-04-16 ENCOUNTER — Other Ambulatory Visit: Payer: Self-pay | Admitting: Family Medicine

## 2017-04-29 ENCOUNTER — Ambulatory Visit (INDEPENDENT_AMBULATORY_CARE_PROVIDER_SITE_OTHER): Payer: Medicare Other | Admitting: Family Medicine

## 2017-04-29 ENCOUNTER — Encounter: Payer: Self-pay | Admitting: Family Medicine

## 2017-04-29 DIAGNOSIS — J189 Pneumonia, unspecified organism: Secondary | ICD-10-CM | POA: Diagnosis not present

## 2017-04-29 MED ORDER — ALBUTEROL SULFATE HFA 108 (90 BASE) MCG/ACT IN AERS: 2.0000 | INHALATION_SPRAY | Freq: Four times a day (QID) | RESPIRATORY_TRACT | 0 refills | Status: DC | PRN

## 2017-04-29 MED ORDER — AZITHROMYCIN 250 MG PO TABS: ORAL_TABLET | ORAL | 0 refills | Status: DC

## 2017-04-29 NOTE — Patient Instructions (Addendum)
Change to zyrtec at bedtime instead of claritin. Can continue flonase 2 sprays per nostril daily.  Complete a course af antibiotics.  Can use albuterol as needed for wheeze, shortness of breath.  Call if not improving as expected.

## 2017-04-29 NOTE — Progress Notes (Signed)
   Subjective:    Patient ID: Vanessa Jones, female    DOB: Dec 12, 1940, 76 y.o.   MRN: [id number]  HPI   76 year old female with history of allergic rhinitis presents with cough ongoing x 1 month.    She reports initially starting with post nasal drip, nasal congestion, cough, productive, clear. Cough has worsened, deep, chest pain and back pain with coughing fits. She is very weak,  trouble vacuuming. Shortness of breath with house work. occ wheeze. No ear pain.  No fever. No sneeze, no itchy eyes.    She has use sudafed, robitussin, nyquil.Marland Kitchen Has helped minimaly.  Has tried claritin x 2 days.. Has not helped.   Husband was sick as well but better now.   She is on lisinopril.  nonsmoker  Blood pressure 120/72, pulse 94, temperature 98.3 F (36.8 C), temperature source Oral, height 5' 4.75" (1.645 m), weight 194 lb 4 oz (88.1 kg), SpO2 95 %.  Review of Systems  Constitutional: Negative for fatigue and fever.  HENT: Negative for ear pain.   Eyes: Negative for pain.  Respiratory: Positive for cough and shortness of breath. Negative for chest tightness.   Cardiovascular: Negative for chest pain, palpitations and leg swelling.  Gastrointestinal: Negative for abdominal pain.  Genitourinary: Negative for dysuria.       Objective:   Physical Exam  Constitutional: Vital signs are normal. She appears well-developed and well-nourished. She is cooperative.  Non-toxic appearance. She does not appear ill. No distress.  HENT:  Head: Normocephalic.  Right Ear: Hearing, tympanic membrane, external ear and ear canal normal. Tympanic membrane is not erythematous, not retracted and not bulging.  Left Ear: Hearing, tympanic membrane, external ear and ear canal normal. Tympanic membrane is not erythematous, not retracted and not bulging.  Nose: No mucosal edema or rhinorrhea. Right sinus exhibits no maxillary sinus tenderness and no frontal sinus tenderness. Left sinus exhibits no  maxillary sinus tenderness and no frontal sinus tenderness.  Mouth/Throat: Uvula is midline, oropharynx is clear and moist and mucous membranes are normal.  Eyes: Conjunctivae, EOM and lids are normal. Pupils are equal, round, and reactive to light. Lids are everted and swept, no foreign bodies found.  Neck: Trachea normal and normal range of motion. Neck supple. Carotid bruit is not present. No thyroid mass and no thyromegaly present.  Cardiovascular: Normal rate, regular rhythm, S1 normal, S2 normal, normal heart sounds, intact distal pulses and normal pulses.  Exam reveals no gallop and no friction rub.   No murmur heard. Pulmonary/Chest: Effort normal and breath sounds normal. No tachypnea. No respiratory distress. She has no decreased breath sounds. She has no wheezes. She has no rhonchi. She has no rales.  constant cough  Abdominal: Soft. Normal appearance and bowel sounds are normal. There is no tenderness.  Neurological: She is alert.  Skin: Skin is warm, dry and intact. No rash noted.  Psychiatric: Her speech is normal and behavior is normal. Judgment and thought content normal. Her mood appears not anxious. Cognition and memory are normal. She does not exhibit a depressed mood.          Assessment & Plan:

## 2017-04-29 NOTE — Assessment & Plan Note (Signed)
Most likely given prolonged cough, fatigue and shortness of breath.  Will treat with antibiotics. No wheeze on exam but given SOB will provide albuterol prn. Also treat with allergy meds given possible contribution to cough from allergies.

## 2017-08-16 ENCOUNTER — Encounter: Payer: Self-pay | Admitting: *Deleted

## 2017-08-17 ENCOUNTER — Encounter
Admission: RE | Disposition: A | Discharge status: Home / Self Care | Payer: Self-pay | Source: Ambulatory Visit | Attending: Gastroenterology

## 2017-08-17 ENCOUNTER — Ambulatory Visit: Payer: Medicare Other | Admitting: Anesthesiology

## 2017-08-17 ENCOUNTER — Ambulatory Visit
Admission: RE | Admit: 2017-08-17 | Discharge: 2017-08-17 | Disposition: A | Discharge status: Home / Self Care | Payer: Medicare Other | Source: Ambulatory Visit | Attending: Gastroenterology | Admitting: Gastroenterology

## 2017-08-17 ENCOUNTER — Encounter: Payer: Self-pay | Admitting: *Deleted

## 2017-08-17 DIAGNOSIS — Z800 Family history of malignant neoplasm of digestive organs: Secondary | ICD-10-CM | POA: Diagnosis not present

## 2017-08-17 DIAGNOSIS — Z79899 Other long term (current) drug therapy: Secondary | ICD-10-CM | POA: Diagnosis not present

## 2017-08-17 DIAGNOSIS — M1990 Unspecified osteoarthritis, unspecified site: Secondary | ICD-10-CM | POA: Diagnosis not present

## 2017-08-17 DIAGNOSIS — Q438 Other specified congenital malformations of intestine: Secondary | ICD-10-CM | POA: Insufficient documentation

## 2017-08-17 DIAGNOSIS — Z8619 Personal history of other infectious and parasitic diseases: Secondary | ICD-10-CM | POA: Diagnosis not present

## 2017-08-17 DIAGNOSIS — Z1211 Encounter for screening for malignant neoplasm of colon: Secondary | ICD-10-CM | POA: Diagnosis not present

## 2017-08-17 DIAGNOSIS — M1712 Unilateral primary osteoarthritis, left knee: Secondary | ICD-10-CM | POA: Insufficient documentation

## 2017-08-17 DIAGNOSIS — Z7982 Long term (current) use of aspirin: Secondary | ICD-10-CM | POA: Diagnosis not present

## 2017-08-17 DIAGNOSIS — K219 Gastro-esophageal reflux disease without esophagitis: Secondary | ICD-10-CM | POA: Diagnosis not present

## 2017-08-17 DIAGNOSIS — K635 Polyp of colon: Secondary | ICD-10-CM | POA: Diagnosis not present

## 2017-08-17 DIAGNOSIS — D122 Benign neoplasm of ascending colon: Secondary | ICD-10-CM | POA: Insufficient documentation

## 2017-08-17 DIAGNOSIS — Z86010 Personal history of colonic polyps: Secondary | ICD-10-CM | POA: Diagnosis not present

## 2017-08-17 DIAGNOSIS — E785 Hyperlipidemia, unspecified: Secondary | ICD-10-CM | POA: Diagnosis not present

## 2017-08-17 DIAGNOSIS — I10 Essential (primary) hypertension: Secondary | ICD-10-CM | POA: Insufficient documentation

## 2017-08-17 DIAGNOSIS — K5730 Diverticulosis of large intestine without perforation or abscess without bleeding: Secondary | ICD-10-CM | POA: Diagnosis not present

## 2017-08-17 DIAGNOSIS — K5790 Diverticulosis of intestine, part unspecified, without perforation or abscess without bleeding: Secondary | ICD-10-CM | POA: Diagnosis not present

## 2017-08-17 HISTORY — DX: Personal history of other infectious and parasitic diseases: Z86.19

## 2017-08-17 HISTORY — PX: COLONOSCOPY WITH PROPOFOL: SHX5780

## 2017-08-17 HISTORY — DX: Complex tear of medial meniscus, current injury, left knee, initial encounter: S83.232A

## 2017-08-17 HISTORY — DX: Other seasonal allergic rhinitis: J30.2

## 2017-08-17 SURGERY — COLONOSCOPY WITH PROPOFOL
Anesthesia: General

## 2017-08-17 MED ORDER — PROPOFOL 500 MG/50ML IV EMUL
INTRAVENOUS | Status: AC
  Filled 2017-08-17: qty 50

## 2017-08-17 MED ORDER — LIDOCAINE HCL (PF) 2 % IJ SOLN
INTRAMUSCULAR | Status: AC
  Filled 2017-08-17: qty 2

## 2017-08-17 MED ORDER — SODIUM CHLORIDE 0.9 % IV SOLN
INTRAVENOUS | Status: DC
  Administered 2017-08-17 (×2): via INTRAVENOUS

## 2017-08-17 MED ORDER — PROPOFOL 500 MG/50ML IV EMUL
INTRAVENOUS | Status: DC | PRN
  Administered 2017-08-17: 120 ug/kg/min via INTRAVENOUS

## 2017-08-17 MED ORDER — SODIUM CHLORIDE 0.9 % IV SOLN: INTRAVENOUS | Status: DC

## 2017-08-17 MED ORDER — LIDOCAINE HCL (CARDIAC) 20 MG/ML IV SOLN
INTRAVENOUS | Status: DC | PRN
  Administered 2017-08-17: 40 mg via INTRAVENOUS

## 2017-08-17 MED ORDER — PROPOFOL 10 MG/ML IV BOLUS
INTRAVENOUS | Status: DC | PRN
  Administered 2017-08-17: 60 mg via INTRAVENOUS
  Administered 2017-08-17 (×2): 20 mg via INTRAVENOUS

## 2017-08-17 NOTE — H&P (Signed)
Outpatient short stay form Pre-procedure 08/17/2017 1:16 PM Lollie Sails MD  Primary Physician: Dr. Eliezer Lofts  Reason for visit:  Colonoscopy  History of present illness:  Patient is a 76 year old female presenting today as above. She has personal history of adenomatous colon polyps family history of colon cancer primary relative. He tolerated her prep well. She takes no aspirin or blood thinning agents with the exception of 81 mg aspirin that was held today.    Current Facility-Administered Medications:  .  0.9 %  sodium chloride infusion, , Intravenous, Continuous, Lollie Sails, MD, Last Rate: 20 mL/hr at 08/17/17 1222 .  0.9 %  sodium chloride infusion, , Intravenous, Continuous, Lollie Sails, MD  Prescriptions Prior to Admission  Medication Sig Dispense Refill Last Dose  . cholecalciferol (VITAMIN D) 1000 units tablet Take 1,500 Units by mouth daily.   Past Week at Unknown time  . hydrochlorothiazide (HYDRODIURIL) 25 MG tablet TAKE 1/2 TABLET BY MOUTH EVERY DAY 45 tablet 1 08/17/2017 at 0800  . lisinopril (PRINIVIL,ZESTRIL) 10 MG tablet TAKE 1 TABLET BY MOUTH EVERY DAY 90 tablet 1 08/17/2017 at 0800  . Acetaminophen (TYLENOL ARTHRITIS PAIN PO) Take 1 tablet by mouth as needed.   Not Taking at Unknown time  . albuterol (PROVENTIL HFA;VENTOLIN HFA) 108 (90 Base) MCG/ACT inhaler Inhale 2 puffs into the lungs every 6 (six) hours as needed for wheezing or shortness of breath. (Patient not taking: Reported on 08/17/2017) 1 Inhaler 0 Not Taking at Unknown time  . aspirin 81 MG tablet Take 81 mg by mouth as needed.    08/15/2017  . atorvastatin (LIPITOR) 10 MG tablet Take 10 mg by mouth daily.   Not Taking at Unknown time  . azithromycin (ZITHROMAX) 250 MG tablet 2 tab po x 1 day then 1 tab po daily (Patient not taking: Reported on 08/17/2017) 6 tablet 0 Not Taking at Unknown time     No Known Allergies   Past Medical History:  Diagnosis Date  . Acute back pain   .  Allergic dermatitis   . Allergic rhinitis   . Complex tear of medial meniscus of left knee   . Diverticulitis   . GERD (gastroesophageal reflux disease)   . History of chickenpox   . Hyperlipidemia   . Hypertension   . Osteoarthritis    LEFT KNEE  . Seasonal allergies     Review of systems:      Physical Exam    Heart and lungs: Regular rate and rhythm without rub or gallop, lungs are bilaterally clear.    HEENT: Normocephalic atraumatic eyes are anicteric    Other:     Pertinant exam for procedure: Soft nontender nondistended bowel sounds positive normoactive.    Planned proceedures: Colonoscopy and indicated procedures. I have discussed the risks benefits and complications of procedures to include not limited to bleeding, infection, perforation and the risk of sedation and the patient wishes to proceed.    Lollie Sails, MD Gastroenterology 08/17/2017  1:16 PM

## 2017-08-17 NOTE — Op Note (Signed)
[facility] Gastroenterology Patient Name: Vanessa Jones Procedure Date: 08/17/2017 1:35 PM MRN: [id number] Account #: [id number] Date of Birth: 1941-06-28 Admit Type: Outpatient Age: 76 Room: [facility] ENDO ROOM 3 Gender: Female Note Status: Finalized Procedure:            Colonoscopy Indications:          Personal history of colonic polyps Providers:            Lollie Sails, MD Referring MD:         Jinny Sanders MD, MD (Referring MD) Medicines:            Monitored Anesthesia Care Complications:        No immediate complications. Procedure:            Pre-Anesthesia Assessment:                       - ASA Grade Assessment: III - A patient with severe                        systemic disease.                       After obtaining informed consent, the colonoscope was                        passed under direct vision. Throughout the procedure,                        the patient's blood pressure, pulse, and oxygen                        saturations were monitored continuously. The                        Colonoscope was introduced through the anus and                        advanced to the the cecum, identified by appendiceal                        orifice and ileocecal valve. The colonoscopy was                        extremely difficult due to significant looping and a                        tortuous colon. Successful completion of the procedure                        was aided by changing the patient to a supine position,                        changing the patient to a prone position and using                        manual pressure. The patient tolerated the procedure                        well. The quality of the bowel preparation was fair. Findings:      Multiple medium-mouthed  diverticula were found in the sigmoid colon and       descending colon.      A 5 mm polyp was found in the proximal ascending colon. The polyp was       sessile. The polyp was  removed with a cold snare. Resection and       retrieval were complete.      A 2 mm polyp was found in the proximal ascending colon. The polyp was       sessile. The polyp was removed with a cold biopsy forceps. Resection and       retrieval were complete.      Two sessile polyps were found in the distal ascending colon. The polyps       were 3 to 4 mm in size. These polyps were removed with a cold biopsy       forceps. Resection and retrieval were complete.      The splenic flexure, transverse colon, hepatic flexure and ascending       colon were significantly redundant. Impression:           - Preparation of the colon was fair.                       - Diverticulosis in the sigmoid colon and in the                        descending colon.                       - One 5 mm polyp in the proximal ascending colon,                        removed with a cold snare. Resected and retrieved.                       - One 2 mm polyp in the proximal ascending colon,                        removed with a cold biopsy forceps. Resected and                        retrieved.                       - Two 3 to 4 mm polyps in the distal ascending colon,                        removed with a cold biopsy forceps. Resected and                        retrieved.                       - Redundant colon. Recommendation:       - Discharge patient to home.                       - Telephone GI clinic for pathology results in 1 week. Procedure Code(s):    --- Professional ---                       [id number], Colonoscopy, flexible; with removal of  tumor(s),                        polyp(s), or other lesion(s) by snare technique                       45380, 59, Colonoscopy, flexible; with biopsy, single                        or multiple Diagnosis Code(s):    --- Professional ---                       D12.2, Benign neoplasm of ascending colon                       Z86.010, Personal history of colonic polyps                        K57.30, Diverticulosis of large intestine without                        perforation or abscess without bleeding                       Q43.8, Other specified congenital malformations of                        intestine CPT copyright 2016 American Medical Association. All rights reserved. The codes documented in this report are preliminary and upon coder review may  be revised to meet current compliance requirements. Lollie Sails, MD 08/17/2017 2:56:37 PM This report has been signed electronically. Number of Addenda: 0 Note Initiated On: 08/17/2017 1:35 PM Scope Withdrawal Time: 0 hours 30 minutes 58 seconds  Total Procedure Duration: 1 hour 7 minutes 40 seconds       [facility]

## 2017-08-17 NOTE — Transfer of Care (Signed)
Immediate Anesthesia Transfer of Care Note  Patient: Vanessa Jones  Procedure(s) Performed: Procedure(s): COLONOSCOPY WITH PROPOFOL (N/A)  Patient Location: PACU  Anesthesia Type:General  Level of Consciousness: sedated  Airway & Oxygen Therapy: Patient Spontanous Breathing and Patient connected to nasal cannula oxygen  Post-op Assessment: Report given to RN and Post -op Vital signs reviewed and stable  Post vital signs: Reviewed and stable  Last Vitals:  Vitals:   08/17/17 1157 08/17/17 1500  BP: 128/77   Pulse: 77   Resp: 18   Temp: (!) 36.1 C (!) 35.7 C  SpO2: 95%     Last Pain:  Vitals:   08/17/17 1500  TempSrc: Tympanic         Complications: No apparent anesthesia complications

## 2017-08-17 NOTE — Anesthesia Preprocedure Evaluation (Signed)
Anesthesia Evaluation  Patient identified by MRN, date of birth, ID band Patient awake    Reviewed: Allergy & Precautions, H&P , NPO status , Patient's Chart, lab work & pertinent test results  History of Anesthesia Complications Negative for: history of anesthetic complications  Airway Mallampati: III  TM Distance: >3 FB Neck ROM: full    Dental no notable dental hx. (+) Teeth Intact   Pulmonary neg pulmonary ROS,    Pulmonary exam normal breath sounds clear to auscultation       Cardiovascular Exercise Tolerance: Good hypertension, Pt. on medications (-) Past MI Normal cardiovascular exam Rhythm:regular Rate:Normal     Neuro/Psych negative neurological ROS  negative psych ROS   GI/Hepatic Neg liver ROS, GERD  Medicated,  Endo/Other  negative endocrine ROS  Renal/GU negative Renal ROS  negative genitourinary   Musculoskeletal  (+) Arthritis ,   Abdominal   Peds negative pediatric ROS (+)  Hematology negative hematology ROS (+)   Anesthesia Other Findings Past Medical History:   Hypertension                                                 Hyperlipidemia                                               Acute back pain                                              Osteoarthritis                                               Allergic dermatitis                                          Allergic rhinitis                                            Diverticulitis                                               GERD (gastroesophageal reflux disease)                       Reproductive/Obstetrics negative OB ROS                             Anesthesia Physical  Anesthesia Plan  ASA: III  Anesthesia Plan: General   Post-op Pain Management:    Induction: Intravenous  PONV Risk Score and Plan:   Airway Management Planned: Nasal Cannula  Additional Equipment:   Intra-op  Plan:    Post-operative Plan:   Informed Consent: I have reviewed the patients History and Physical, chart, labs and discussed the procedure including the risks, benefits and alternatives for the proposed anesthesia with the patient or authorized representative who has indicated his/her understanding and acceptance.   Dental advisory given  Plan Discussed with: Anesthesiologist, CRNA and Surgeon  Anesthesia Plan Comments:         Anesthesia Quick Evaluation

## 2017-08-17 NOTE — Anesthesia Post-op Follow-up Note (Signed)
Anesthesia QCDR form completed.        

## 2017-08-18 ENCOUNTER — Encounter: Payer: Self-pay | Admitting: Gastroenterology

## 2017-08-19 LAB — SURGICAL PATHOLOGY

## 2017-08-19 NOTE — Anesthesia Postprocedure Evaluation (Signed)
Anesthesia Post Note  Patient: Vanessa Jones  Procedure(s) Performed: Procedure(s) (LRB): COLONOSCOPY WITH PROPOFOL (N/A)  Patient location during evaluation: Endoscopy Anesthesia Type: General Level of consciousness: awake and alert Pain management: pain level controlled Vital Signs Assessment: post-procedure vital signs reviewed and stable Respiratory status: spontaneous breathing, nonlabored ventilation, respiratory function stable and patient connected to nasal cannula oxygen Cardiovascular status: blood pressure returned to baseline and stable Postop Assessment: no apparent nausea or vomiting Anesthetic complications: no     Last Vitals:  Vitals:   08/17/17 1500 08/17/17 1530  BP:  (!) 127/91  Pulse:    Resp:    Temp: (!) 35.7 C   SpO2:      Last Pain:  Vitals:   08/18/17 0738  TempSrc:   PainSc: 0-No pain                 Precious Haws Piscitello

## 2017-09-22 DIAGNOSIS — Z23 Encounter for immunization: Secondary | ICD-10-CM | POA: Diagnosis not present

## 2018-02-14 ENCOUNTER — Other Ambulatory Visit: Payer: Self-pay | Admitting: Family Medicine

## 2018-03-31 ENCOUNTER — Ambulatory Visit: Payer: Self-pay | Admitting: *Deleted

## 2018-03-31 NOTE — Telephone Encounter (Signed)
Patient calling back and states the Miralax has kicked in

## 2018-03-31 NOTE — Telephone Encounter (Signed)
Pt has appt with Dr Diona Browner on 04/05/18 at 11 AM.

## 2018-03-31 NOTE — Telephone Encounter (Signed)
If no emesis and abdominal pain... doubt blockage. Increase water,  continue miralax daily add milk of magnesia and if no result consider enema.

## 2018-03-31 NOTE — Telephone Encounter (Signed)
Pt states she has had diverticulitis in the past, and now she has not had a normal BM since last Friday. Pt has taken Miralax 4 x since, magnesium pills and several stool softners. Pt not sure if she should continue this regimen. Not having any pain yet. Would like advice if ok to wait to see the dr on Tuesday and continue the miralax?     Patient started having problems with BM last week- patient has had constipation- she has tried to manage this with laxatives she started yesterday- this morning she had small amount of movement. Patient reports she had 3 small soft pieces of stool pass this morning- but not enough to count for her. Patient states she has has small pellets of stool throughout the past few days. Patient is very concerned about a blockage. She has soft abdomin and she is pushing her fluids. Advised patient to continue her Miralax and fluids to see if she gets more movement throughout the day. She is going to do that and will call back if she is not successful using OTC to get her bowels moving since she has begun to have success using OTC treatment. Patient does have an upcoming appointment on Tuesday.  Reason for Disposition . Treating constipation with Over-The-Counter (OTC) medicines, questions about  Answer Assessment - Initial Assessment Questions 1. STOOL PATTERN OR FREQUENCY: "How often do you pass bowel movements (BMs)?"  (Normal range: tid to q 3 days)  "When was the last BM passed?"       1/day  or qod and last BM was Thursday.  2. STRAINING: "Do you have to strain to have a BM?"      Normally patient does not have to strain 3. RECTAL PAIN: "Does your rectum hurt when the stool comes out?" If so, ask: "Do you have hemorrhoids? How bad is the pain?"  (Scale 1-10; or mild, moderate, severe)     Once in a while- patient has hx of hemorrhoids and has had surgery in the past for them. Mild if any 4. STOOL COMPOSITION: "Are the stools hard?"      Patient thinks she had been  building to the constipation- she had a granola bar and she thinks that that may have triggered it 5. BLOOD ON STOOLS: "Has there been any blood on the toilet tissue or on the surface of the BM?" If so, ask: "When was the last time?"      No blood in stools 6. CHRONIC CONSTIPATION: "Is this a new problem for you?"  If no, ask: How long have you had this problem?" (days, weeks, months)      Constipation has been a problem- patient has always tried to be deligent  7. CHANGES IN DIET: "Have there been any recent changes in your diet?"      Granola bar may have the wrong thing to eat- it had cocoanut  8. MEDICATIONS: "Have you been taking any new medications?"     No changes 9. LAXATIVES: "Have you been using any laxatives or enemas?"  If yes, ask "What, how often, and when was the last time?"     Miralax and magnesium yesterday and day before- small movement today 10. CAUSE: "What do you think is causing the constipation?"        Patient is concerned about her history of diverticulitis  11. OTHER SYMPTOMS: "Do you have any other symptoms?" (e.g., abdominal pain, fever, vomiting)       No- soft abdomin  12. PREGNANCY: "Is there any chance you are pregnant?" "When was your last menstrual period?"       n/a  Protocols used: CONSTIPATION-A-AH

## 2018-04-05 ENCOUNTER — Ambulatory Visit: Payer: Medicare Other | Admitting: Family Medicine

## 2018-04-25 ENCOUNTER — Telehealth: Payer: Self-pay

## 2018-04-25 DIAGNOSIS — I10 Essential (primary) hypertension: Secondary | ICD-10-CM

## 2018-04-25 DIAGNOSIS — R7303 Prediabetes: Secondary | ICD-10-CM

## 2018-04-25 DIAGNOSIS — E559 Vitamin D deficiency, unspecified: Secondary | ICD-10-CM

## 2018-04-25 DIAGNOSIS — E7800 Pure hypercholesterolemia, unspecified: Secondary | ICD-10-CM

## 2018-04-25 NOTE — Telephone Encounter (Signed)
CPE labs ordered and sent to PCP for approval. PCP please review lab orders and make modifications as necessary.  

## 2018-04-26 ENCOUNTER — Other Ambulatory Visit (INDEPENDENT_AMBULATORY_CARE_PROVIDER_SITE_OTHER): Payer: Medicare Other

## 2018-04-26 ENCOUNTER — Ambulatory Visit: Payer: Medicare Other

## 2018-04-26 DIAGNOSIS — E559 Vitamin D deficiency, unspecified: Secondary | ICD-10-CM

## 2018-04-26 DIAGNOSIS — E7800 Pure hypercholesterolemia, unspecified: Secondary | ICD-10-CM | POA: Diagnosis not present

## 2018-04-26 DIAGNOSIS — R7303 Prediabetes: Secondary | ICD-10-CM

## 2018-04-26 DIAGNOSIS — I10 Essential (primary) hypertension: Secondary | ICD-10-CM | POA: Diagnosis not present

## 2018-04-26 LAB — COMPREHENSIVE METABOLIC PANEL
ALT: 9 U/L (ref 0–35)
AST: 16 U/L (ref 0–37)
Albumin: 4.3 g/dL (ref 3.5–5.2)
Alkaline Phosphatase: 70 U/L (ref 39–117)
BUN: 17 mg/dL (ref 6–23)
CALCIUM: 9.6 mg/dL (ref 8.4–10.5)
CHLORIDE: 102 meq/L (ref 96–112)
CO2: 25 meq/L (ref 19–32)
Creatinine, Ser: 0.96 mg/dL (ref 0.40–1.20)
GFR: 60.23 mL/min (ref >60.00)
Glucose, Bld: 106 mg/dL — ABNORMAL HIGH (ref 70–99)
POTASSIUM: 4 meq/L (ref 3.5–5.1)
Sodium: 137 mEq/L (ref 135–145)
Total Bilirubin: 0.6 mg/dL (ref 0.2–1.2)
Total Protein: 7.4 g/dL (ref 6.0–8.3)

## 2018-04-26 LAB — CBC WITH DIFFERENTIAL/PLATELET
BASOS PCT: 0.5 % (ref 0.0–3.0)
Basophils Absolute: 0 10*3/uL (ref 0.0–0.1)
Eosinophils Absolute: 0.4 10*3/uL (ref 0.0–0.7)
Eosinophils Relative: 4.6 % (ref 0.0–5.0)
HCT: 40.7 % (ref 36.0–46.0)
Hemoglobin: 13.8 g/dL (ref 12.0–15.0)
LYMPHS PCT: 41.1 % (ref 12.0–46.0)
Lymphs Abs: 3.4 10*3/uL (ref 0.7–4.0)
MCHC: 34 g/dL (ref 30.0–36.0)
MCV: 92.1 fl (ref 78.0–100.0)
MONOS PCT: 7.3 % (ref 3.0–12.0)
Monocytes Absolute: 0.6 10*3/uL (ref 0.1–1.0)
NEUTROS ABS: 3.8 10*3/uL (ref 1.4–7.7)
Neutrophils Relative %: 46.5 % (ref 43.0–77.0)
PLATELETS: 358 10*3/uL (ref 150.0–400.0)
RBC: 4.42 Mil/uL (ref 3.87–5.11)
RDW: 14.6 % (ref 11.5–15.5)
WBC: 8.2 10*3/uL (ref 4.0–10.5)

## 2018-04-26 LAB — VITAMIN D 25 HYDROXY (VIT D DEFICIENCY, FRACTURES): VITD: 44.37 ng/mL (ref 30.00–100.00)

## 2018-04-26 LAB — HEMOGLOBIN A1C: Hgb A1c MFr Bld: 6.1 % (ref 4.6–6.5)

## 2018-04-26 LAB — LIPID PANEL
CHOLESTEROL: 221 mg/dL — AB (ref 0–200)
HDL: 57.8 mg/dL (ref >39.00)
LDL Cholesterol: 138 mg/dL — ABNORMAL HIGH (ref 0–99)
NonHDL: 162.87
TRIGLYCERIDES: 125 mg/dL (ref 0.0–149.0)
Total CHOL/HDL Ratio: 4
VLDL: 25 mg/dL (ref 0.0–40.0)

## 2018-05-03 ENCOUNTER — Encounter: Payer: Self-pay | Admitting: Family Medicine

## 2018-05-10 ENCOUNTER — Encounter: Payer: Self-pay | Admitting: Family Medicine

## 2018-05-10 ENCOUNTER — Ambulatory Visit (INDEPENDENT_AMBULATORY_CARE_PROVIDER_SITE_OTHER): Payer: Medicare Other | Admitting: Family Medicine

## 2018-05-10 ENCOUNTER — Other Ambulatory Visit: Payer: Self-pay

## 2018-05-10 VITALS — BP 124/80 | HR 83 | Temp 98.4°F | Ht 64.5 in | Wt 189.2 lb

## 2018-05-10 DIAGNOSIS — Z0000 Encounter for general adult medical examination without abnormal findings: Secondary | ICD-10-CM | POA: Diagnosis not present

## 2018-05-10 DIAGNOSIS — I10 Essential (primary) hypertension: Secondary | ICD-10-CM | POA: Diagnosis not present

## 2018-05-10 DIAGNOSIS — E559 Vitamin D deficiency, unspecified: Secondary | ICD-10-CM | POA: Diagnosis not present

## 2018-05-10 DIAGNOSIS — E7800 Pure hypercholesterolemia, unspecified: Secondary | ICD-10-CM

## 2018-05-10 DIAGNOSIS — R7303 Prediabetes: Secondary | ICD-10-CM | POA: Diagnosis not present

## 2018-05-10 MED ORDER — ATORVASTATIN CALCIUM 10 MG PO TABS: 10.0000 mg | ORAL_TABLET | Freq: Every day | ORAL | 11 refills | Status: DC

## 2018-05-10 NOTE — Patient Instructions (Addendum)
Look into [facility], any MD there or Dr. Ledell Peoples office.   Glucosamine 500 mg 1-3 times daily for arthritis.  Return for cholesterol recheck in 3-6 months Call to schedule mammogram on your own.

## 2018-05-10 NOTE — Progress Notes (Signed)
Subjective:    Patient ID: Vanessa Jones, female    DOB: June 28, 1941, 77 y.o.   MRN: [id number]  HPI   The patient presents for annual medicare wellness, complete physical and review of chronic health problems. He/She also has the following acute concerns today: Several new skin lesion face and nose.Marland Kitchen Requests referral to Derm.   Elevated Cholesterol:  She has been off statin for 2 years. She is okay with restarting the medication again. NO SE in past. Lab Results  Component Value Date   CHOL 221 (H) 04/26/2018   HDL 57.80 04/26/2018   LDLCALC 138 (H) 04/26/2018   TRIG 125.0 04/26/2018   CHOLHDL 4 04/26/2018  Using medications without problems: Muscle aches:  Diet compliance: moderate Exercise: walking up stairs Other complaints:  Prediabetes  Lab Results  Component Value Date   HGBA1C 6.1 04/26/2018   Hypertension:   Well controlled on lisinopril and HCTZ BP Readings from Last 3 Encounters:  05/10/18 124/80  08/17/17 (!) 127/91  04/29/17 120/72  Using medication without problems or lightheadedness: none  Chest pain with exertion: none Edema:none Short of breath: stable shortness of breath with significant exertion Average home BPs: not checking Other issues:      Advance directives and end of life planning reviewed in detail with patient and documented in EMR. Patient given handout on advance care directives if needed. HCPOA and living will updated if needed. Social History /Family History/Past Medical History reviewed in detail and updated in EMR if needed. Blood pressure 124/80, pulse 83, temperature 98.4 F (36.9 C), temperature source Oral, height 5' 4.5" (1.638 m), weight 189 lb 4 oz (85.8 kg).  Hearing Screening   Method: Audiometry   125Hz  250Hz  500Hz  1000Hz  2000Hz  3000Hz  4000Hz  6000Hz  8000Hz   Right ear:   40 0 0  0    Left ear:   40 0 0  0      Visual Acuity Screening   Right eye Left eye Both eyes  Without correction: 20/20 20/20 20/20   With  correction:      Depression screen [facility] 2/9 05/10/2018 02/02/2017 01/30/2016  Decreased Interest 0 0 0  Down, Depressed, Hopeless 0 0 0  PHQ - 2 Score 0 0 0  Altered sleeping - - -  Tired, decreased energy - - -  Change in appetite - - -  Feeling bad or failure about yourself  - - -  Trouble concentrating - - -  Moving slowly or fidgety/restless - - -  Suicidal thoughts - - -  PHQ-9 Score - - -   Fall Risk  05/10/2018 02/02/2017 01/30/2016 10/05/2014 07/27/2013  Falls in the past year? No No No No No     Review of Systems  Constitutional: Negative for fatigue and fever.  HENT: Negative for congestion.   Eyes: Negative for pain.  Respiratory: Negative for cough and shortness of breath.   Cardiovascular: Negative for chest pain, palpitations and leg swelling.  Gastrointestinal: Negative for abdominal pain.  Genitourinary: Negative for dysuria and vaginal bleeding.  Musculoskeletal: Negative for back pain.  Neurological: Negative for syncope, light-headedness and headaches.  Psychiatric/Behavioral: Negative for dysphoric mood.       Objective:   Physical Exam  Constitutional: Vital signs are normal. She appears well-developed and well-nourished. She is cooperative.  Non-toxic appearance. She does not appear ill. No distress.  HENT:  Head: Normocephalic.  Right Ear: Hearing, tympanic membrane, external ear and ear canal normal.  Left Ear: Hearing, tympanic membrane, external  ear and ear canal normal.  Nose: Nose normal.  Eyes: Pupils are equal, round, and reactive to light. Conjunctivae, EOM and lids are normal. Lids are everted and swept, no foreign bodies found.  Neck: Trachea normal and normal range of motion. Neck supple. Carotid bruit is not present. No thyroid mass and no thyromegaly present.  Cardiovascular: Normal rate, regular rhythm, S1 normal, S2 normal, normal heart sounds and intact distal pulses. Exam reveals no gallop.  No murmur heard. Pulmonary/Chest: Effort normal and  breath sounds normal. No respiratory distress. She has no wheezes. She has no rhonchi. She has no rales.  Abdominal: Soft. Normal appearance and bowel sounds are normal. She exhibits no distension, no fluid wave, no abdominal bruit and no mass. There is no hepatosplenomegaly. There is no tenderness. There is no rebound, no guarding and no CVA tenderness. No hernia.  Lymphadenopathy:    She has no cervical adenopathy.    She has no axillary adenopathy.  Neurological: She is alert. She has normal strength. No cranial nerve deficit or sensory deficit.  Skin: Skin is warm, dry and intact. No rash noted.  Psychiatric: Her speech is normal and behavior is normal. Judgment normal. Her mood appears not anxious. Cognition and memory are normal. She does not exhibit a depressed mood.          Assessment & Plan:  The patient's preventative maintenance and recommended screening tests for an annual wellness exam were reviewed in full today. Brought up to date unless services declined.  Counselled on the importance of diet, exercise, and its role in overall health and mortality. The patient's FH and SH was reviewed, including their home life, tobacco status, and drug and alcohol status.   Recmmended hearing aid. Colon: 07/2017 Dr. Gustavo Lah, [facility], repeat in 3 years. Family history of colon cancer in mother .  PAP/DVE: PAP not indicated due to age. Has decided no DVE, no symptoms, no family history of uterine and ovarian cancer.  DEXA: 2017 normal improved, recheck Mammogram: Last 2017,sister with breast cancer. Vaccines: Td and PNA, shingles vaccine uptodate. Refused flu.

## 2018-05-10 NOTE — Assessment & Plan Note (Signed)
Good control on supplementation.

## 2018-05-10 NOTE — Assessment & Plan Note (Signed)
Stable control. Work on low carb diet. 

## 2018-05-10 NOTE — Assessment & Plan Note (Signed)
Well controlled. Continue current medication. Encouraged exercise, weight loss, healthy eating habits.  

## 2018-05-10 NOTE — Assessment & Plan Note (Addendum)
Discussed increased risk oF CVD given not on statin and chol not at goal.  Pt agreeable to restart and will recheck levels in 3-6 months.

## 2018-05-19 ENCOUNTER — Ambulatory Visit (INDEPENDENT_AMBULATORY_CARE_PROVIDER_SITE_OTHER): Payer: Medicare Other | Admitting: Family Medicine

## 2018-05-19 ENCOUNTER — Encounter: Payer: Self-pay | Admitting: Family Medicine

## 2018-05-19 VITALS — BP 114/76 | HR 69 | Temp 98.4°F | Ht 64.5 in | Wt 187.8 lb

## 2018-05-19 DIAGNOSIS — K5792 Diverticulitis of intestine, part unspecified, without perforation or abscess without bleeding: Secondary | ICD-10-CM

## 2018-05-19 MED ORDER — CIPROFLOXACIN HCL 500 MG PO TABS: 500.0000 mg | ORAL_TABLET | Freq: Two times a day (BID) | ORAL | 0 refills | Status: DC

## 2018-05-19 MED ORDER — METRONIDAZOLE 500 MG PO TABS: 500.0000 mg | ORAL_TABLET | Freq: Three times a day (TID) | ORAL | 0 refills | Status: AC

## 2018-05-19 NOTE — Progress Notes (Signed)
Dr. Frederico Hamman T. Oliviya Gilkison, MD, [facility] [place], [address] Phone: [phone] Fax: [phone]  05/19/2018  Patient: Vanessa Jones, MRN: [id number], DOB: Feb 01, 1941, 77 y.o.  Primary Physician:  Jinny Sanders, MD   Chief Complaint  Patient presents with  . Diverticulitis    Cramping, Nausea, Trouble having BMs   Subjective:   Vanessa Jones is a 77 y.o. very pleasant female patient who presents with the following:  Having some significant abdominal pain. Some pain around the rectum. Just got back from [place]. Had some diverticulitis in 2016. Had some cipro and flagyl. Has had some mild flare ups in the past. Took 3 miralax, some fleets, and some dulcolax.   She is having some pain, more in the lower abdomen, more on the left.  She has not had any blood in her stool.  She has been able to eat, but quite diminished compared to baseline.  Past Medical History, Surgical History, Social History, Family History, Problem List, Medications, and Allergies have been reviewed and updated if relevant.  Patient Active Problem List   Diagnosis Date Noted  . Prediabetes 01/30/2016  . Osteoarthritis of left knee 01/30/2016  . Pure hypercholesterolemia 01/30/2016  . Counseling regarding end of life decision making 10/05/2014  . Vitamin D deficiency 10/05/2014  . 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 Osteopenia 05/15/2011  . Essential hypertension, benign 03/20/2008  . Allergic rhinitis 03/20/2008  . DERMATITIS, ALLERGIC 03/20/2008  . OSTEOARTHRITIS 03/20/2008    Past Medical History:  Diagnosis Date  . Acute back pain   . Allergic dermatitis   . Allergic rhinitis   . Complex tear of medial meniscus of left knee   . Diverticulitis   . GERD (gastroesophageal reflux disease)   . History of chickenpox   . Hyperlipidemia   . Hypertension   . Osteoarthritis    LEFT KNEE  . Seasonal allergies     Past Surgical  History:  Procedure Laterality Date  . CHOLECYSTECTOMY    . COLONOSCOPY    . COLONOSCOPY WITH PROPOFOL N/A 08/17/2017   Procedure: COLONOSCOPY WITH PROPOFOL;  Surgeon: Lollie Sails, MD;  Location[facility] ENDOSCOPY;  Service: Endoscopy;  Laterality: N/A;  . HEMORRHOID SURGERY    . KNEE ARTHROSCOPY WITH MENISCAL REPAIR Left 08/08/2015   Procedure: KNEE ARTHROSCOPY WITH medial and lateral menisectomy;  Surgeon: Corky Mull, MD;  Location: [facility] ORS;  Service: Orthopedics;  Laterality: Left;  . TUBAL LIGATION      Social History   Socioeconomic History  . Marital status: Married    Spouse name: Not on file  . Number of children: 2  . Years of education: Not on file  . Highest education level: Not on file  Occupational History  . Occupation: Herbalist, retired  Scientific laboratory technician  . Financial resource strain: Not on file  . Food insecurity:    Worry: Not on file    Inability: Not on file  . Transportation needs:    Medical: Not on file    Non-medical: Not on file  Tobacco Use  . Smoking status: Never Smoker  . Smokeless tobacco: Never Used  Substance and Sexual Activity  . Alcohol use: Yes    Comment: Occ glass of wine  . Drug use: No  . Sexual activity: Not on file  Lifestyle  . Physical activity:    Days per week: Not on file    Minutes per session:  Not on file  . Stress: Not on file  Relationships  . Social connections:    Talks on phone: Not on file    Gets together: Not on file    Attends religious service: Not on file    Active member of club or organization: Not on file    Attends meetings of clubs or organizations: Not on file    Relationship status: Not on file  . Intimate partner violence:    Fear of current or ex partner: Not on file    Emotionally abused: Not on file    Physically abused: Not on file    Forced sexual activity: Not on file  Other Topics Concern  . Not on file  Social History Narrative   Retired Herbalist   Married, 2 healthy  boys    Regular exercise- yes, walking 3 days a week.   2006 treadmill stress test, low risk.      Moderate diet   No HCOPA, no living will, info given (reviewed 2015)    Family History  Problem Relation Age of Onset  . Hypertension Mother   . Cancer Mother        colon, age 40  . Diabetes Father   . Emphysema Father   . Cancer Sister        breast  . Cancer Brother        bladder  . Coronary artery disease Brother   . Coronary artery disease Maternal Grandmother   . Heart disease Paternal Grandmother        enlarged heart  . Stroke Paternal Grandfather     No Known Allergies  Medication list reviewed and updated in full in [facility].  ROS: GEN: Acute illness details above GI: Tolerating PO intake GU: maintaining adequate hydration and urination Pulm: No SOB Interactive and getting along well at home.  Otherwise, ROS is as per the HPI.  Objective:   BP 114/76   Pulse 69   Temp 98.4 F (36.9 C) (Oral)   Ht 5' 4.5" (1.638 m)   Wt 187 lb 12 oz (85.2 kg)   BMI 31.73 kg/m   GEN: WDWN, NAD, Non-toxic, A & O x 3 HEENT: Atraumatic, Normocephalic. Neck supple. No masses, No LAD. Ears and Nose: No external deformity. CV: RRR, No M/G/R. No JVD. No thrill. No extra heart sounds. PULM: CTA B, no wheezes, crackles, rhonchi. No retractions. No resp. distress. No accessory muscle use. ABD: S, ttp in LLQ and hypogastric region, ND, +BS. No rebound. No HSM. EXTR: No c/c/e NEURO Normal gait.  PSYCH: Normally interactive. Conversant. Not depressed or anxious appearing.  Calm demeanor.     Laboratory and Imaging Data:  Assessment and Plan:   Acute diverticulitis  History and exam are consistent with diverticulitis in a patient with prior history, and we will treat as such.  If symptoms persist or worsen, further evaluation would be warranted.  Follow-up: No follow-ups on file.  Meds ordered this encounter  Medications  . metroNIDAZOLE (FLAGYL) 500 MG tablet      Sig: Take 1 tablet (500 mg total) by mouth 3 (three) times daily for 10 days.    Dispense:  30 tablet    Refill:  0  . ciprofloxacin (CIPRO) 500 MG tablet    Sig: Take 1 tablet (500 mg total) by mouth 2 (two) times daily.    Dispense:  20 tablet    Refill:  0    Signed,  Jaran Sainz T.  Shaan Rhoads, MD   Allergies as of 05/19/2018   No Known Allergies     Medication List        Accurate as of 05/19/18  7:27 PM. Always use your most recent med list.          aspirin 81 MG tablet Take 81 mg by mouth as needed.   atorvastatin 10 MG tablet Commonly known as:  LIPITOR Take 1 tablet (10 mg total) by mouth daily.   cholecalciferol 1000 units tablet Commonly known as:  VITAMIN D Take 1,500 Units by mouth daily.   ciprofloxacin 500 MG tablet Commonly known as:  CIPRO Take 1 tablet (500 mg total) by mouth 2 (two) times daily.   hydrochlorothiazide 25 MG tablet Commonly known as:  HYDRODIURIL TAKE 1/2 TABLET BY MOUTH EVERY DAY   lisinopril 10 MG tablet Commonly known as:  PRINIVIL,ZESTRIL TAKE 1 TABLET BY MOUTH EVERY DAY   metroNIDAZOLE 500 MG tablet Commonly known as:  FLAGYL Take 1 tablet (500 mg total) by mouth 3 (three) times daily for 10 days.   TYLENOL ARTHRITIS PAIN PO Take 1 tablet by mouth as needed.

## 2018-05-24 ENCOUNTER — Ambulatory Visit: Payer: Self-pay | Admitting: Family Medicine

## 2018-05-24 ENCOUNTER — Telehealth: Payer: Self-pay | Admitting: Family Medicine

## 2018-05-24 NOTE — Telephone Encounter (Signed)
Copied from [place] [id number]. Topic: Quick Communication - See Telephone Encounter >> May 24, 2018  5:38 PM Neva Seat wrote: Pt wants to know if it's ok to take Miralax with the antibiotics.  Pt asks if she can be texed the answer yes or no.

## 2018-05-24 NOTE — Telephone Encounter (Signed)
Pt was seen in office on 05/19/18 and dx with acute diverticulitis. Pt was prescribed Flagyl and Cipro. Pt called to report that she has taken both for 5 days and is only having small dark brown mucoid stools. Pt states that she is not having any cramping. She stated that she is having some bloating. She thinks it has been 10 days since her last "real" bowel movement.. Pt asked if she needs to be prescribed a different abx or needs to wait it out.

## 2018-05-24 NOTE — Telephone Encounter (Signed)
If no pain or fever.. Give antibiotics more time.  Can also make her a follow up appt on Thursday.

## 2018-05-24 NOTE — Telephone Encounter (Signed)
Vanessa Jones notified as instructed by telephone.  She denies any pain or fever.  Will give antibiotics more time.

## 2018-05-25 NOTE — Telephone Encounter (Signed)
I looked up the question and it said to ck with physician due to possibly lowering magnesium or Potassium.Please advise.

## 2018-05-26 NOTE — Telephone Encounter (Signed)
Okay to take miralax with the antibiotics.Vanessa Jones

## 2018-05-26 NOTE — Telephone Encounter (Signed)
[name] notified as instructed by telephone.

## 2018-06-29 DIAGNOSIS — D229 Melanocytic nevi, unspecified: Secondary | ICD-10-CM | POA: Diagnosis not present

## 2018-06-29 DIAGNOSIS — D1801 Hemangioma of skin and subcutaneous tissue: Secondary | ICD-10-CM | POA: Diagnosis not present

## 2018-06-29 DIAGNOSIS — L814 Other melanin hyperpigmentation: Secondary | ICD-10-CM | POA: Diagnosis not present

## 2018-06-29 DIAGNOSIS — L570 Actinic keratosis: Secondary | ICD-10-CM | POA: Diagnosis not present

## 2018-06-29 DIAGNOSIS — L821 Other seborrheic keratosis: Secondary | ICD-10-CM | POA: Diagnosis not present

## 2018-07-01 ENCOUNTER — Other Ambulatory Visit: Payer: Self-pay

## 2018-07-19 ENCOUNTER — Other Ambulatory Visit: Payer: Self-pay | Admitting: Family Medicine

## 2018-09-27 ENCOUNTER — Encounter: Payer: Self-pay | Admitting: Family Medicine

## 2018-09-27 DIAGNOSIS — Z23 Encounter for immunization: Secondary | ICD-10-CM | POA: Diagnosis not present

## 2018-09-27 DIAGNOSIS — Z1231 Encounter for screening mammogram for malignant neoplasm of breast: Secondary | ICD-10-CM | POA: Diagnosis not present

## 2018-10-03 ENCOUNTER — Telehealth: Payer: Self-pay | Admitting: Family Medicine

## 2018-10-03 NOTE — Telephone Encounter (Signed)
Pt went to [facility] image for a mammogram and is now needing to have further testing and they are wanting pt to go to [facility] in [place]. Pt calling and wanting an opinion as to where to go and if she should go to [facility] or [facility]. Please advise pt and she has some questions about where to go.

## 2018-10-03 NOTE — Telephone Encounter (Signed)
Please call pt.. Where she goes is up to her. I think both would  be adequate for this issue. If she wants [facility] I would recommend.  [facility]... Let me know what orders if any need to be placed or if [facility] will refer her.

## 2018-10-03 NOTE — Telephone Encounter (Signed)
Vanessa Jones notified as instructed by telephone.  She states she will just keep it as is at [facility] in [place].

## 2018-10-24 DIAGNOSIS — R928 Other abnormal and inconclusive findings on diagnostic imaging of breast: Secondary | ICD-10-CM | POA: Diagnosis not present

## 2018-10-26 ENCOUNTER — Ambulatory Visit (INDEPENDENT_AMBULATORY_CARE_PROVIDER_SITE_OTHER): Payer: Medicare Other | Admitting: Family Medicine

## 2018-10-26 ENCOUNTER — Encounter: Payer: Self-pay | Admitting: Family Medicine

## 2018-10-26 VITALS — BP 124/88 | HR 86 | Ht 64.5 in

## 2018-10-26 DIAGNOSIS — R300 Dysuria: Secondary | ICD-10-CM

## 2018-10-26 DIAGNOSIS — N3090 Cystitis, unspecified without hematuria: Secondary | ICD-10-CM

## 2018-10-26 LAB — POC URINALSYSI DIPSTICK (AUTOMATED)
Bilirubin, UA: NEGATIVE
Glucose, UA: NEGATIVE
Ketones, UA: NEGATIVE
Nitrite, UA: NEGATIVE
PROTEIN UA: POSITIVE — AB
Spec Grav, UA: 1.02 (ref 1.010–1.025)
UROBILINOGEN UA: 0.2 U/dL
pH, UA: 6 (ref 5.0–8.0)

## 2018-10-26 MED ORDER — CEPHALEXIN 500 MG PO CAPS: 500.0000 mg | ORAL_CAPSULE | Freq: Two times a day (BID) | ORAL | 0 refills | Status: AC

## 2018-10-26 NOTE — Progress Notes (Signed)
Subjective:    Patient ID: Vanessa Jones, female    DOB: 1941/07/13, 77 y.o.   MRN: [id number]  HPI This is a 77 yo female who presents today with dysuria for 1-2 weeks. Thinks that this is from wearing blue jeans. Urine has been cloudy but not dark. No fever, no lower abdominal pain, no nausea/vomiting.   Past Medical History:  Diagnosis Date  . Acute back pain   . Allergic dermatitis   . Allergic rhinitis   . Complex tear of medial meniscus of left knee   . Diverticulitis   . GERD (gastroesophageal reflux disease)   . History of chickenpox   . Hyperlipidemia   . Hypertension   . Osteoarthritis    LEFT KNEE  . Seasonal allergies    Past Surgical History:  Procedure Laterality Date  . CHOLECYSTECTOMY    . COLONOSCOPY    . COLONOSCOPY WITH PROPOFOL N/A 08/17/2017   Procedure: COLONOSCOPY WITH PROPOFOL;  Surgeon: Lollie Sails, MD;  Location: [facility] ENDOSCOPY;  Service: Endoscopy;  Laterality: N/A;  . HEMORRHOID SURGERY    . KNEE ARTHROSCOPY WITH MENISCAL REPAIR Left 08/08/2015   Procedure: KNEE ARTHROSCOPY WITH medial and lateral menisectomy;  Surgeon: Corky Mull, MD;  Location: [facility] ORS;  Service: Orthopedics;  Laterality: Left;  . TUBAL LIGATION     Family History  Problem Relation Age of Onset  . Hypertension Mother   . Cancer Mother        colon, age 35  . Diabetes Father   . Emphysema Father   . Cancer Sister        breast  . Cancer Brother        bladder  . Coronary artery disease Brother   . Coronary artery disease Maternal Grandmother   . Heart disease Paternal Grandmother        enlarged heart  . Stroke Paternal Grandfather    Social History   Tobacco Use  . Smoking status: Never Smoker  . Smokeless tobacco: Never Used  Substance Use Topics  . Alcohol use: Yes    Comment: Occ glass of wine  . Drug use: No      Review of Systems Per HPI    Objective:   Physical Exam Physical Exam  Constitutional: She is oriented to person, place, and  time. She appears well-developed and well-nourished. No distress.  HENT:  Head: Normocephalic and atraumatic.  Cardiovascular: Normal rate, regular rhythm and normal heart sounds.   Pulmonary/Chest: Effort normal and breath sounds normal.  Abdominal: Soft. She exhibits no distension. There is no tenderness. There is no rebound, no guarding and no CVA tenderness.  Neurological: She is alert and oriented to person, place, and time.  Skin: Skin is warm and dry. She is not diaphoretic.  Psychiatric: She has a normal mood and affect. Her behavior is normal. Judgment and thought content normal.  Vitals reviewed.    BP 124/88 (BP Location: Left Arm, Patient Position: Sitting, Cuff Size: Large)   Pulse 86   Ht 5' 4.5" (1.638 m)   SpO2 98%   BMI 31.73 kg/m   Results for orders placed or performed in visit on 10/26/18  POCT Urinalysis Dipstick (Automated)  Result Value Ref Range   Color, UA yellow    Clarity, UA cloudy    Glucose, UA Negative Negative   Bilirubin, UA neg    Ketones, UA neg    Spec Grav, UA 1.020 1.010 - 1.025   Blood, UA 1+  pH, UA 6.0 5.0 - 8.0   Protein, UA Positive (A) Negative   Urobilinogen, UA 0.2 0.2 or 1.0 E.U./dL   Nitrite, UA NEG    Leukocytes, UA Large (3+) (A) Negative       Assessment & Plan:  1. Dysuria - POCT Urinalysis Dipstick (Automated) - Urine Culture  2. Cystitis - Provided written and verbal information regarding diagnosis and treatment. - RTC precautions reviewed - cephALEXin (KEFLEX) 500 MG capsule; Take 1 capsule (500 mg total) by mouth 2 (two) times daily for 7 days.  Dispense: 14 capsule; Refill: 0   Clarene Reamer, FNP-BC  [facility], [facility]  10/26/2018 12:10 PM

## 2018-10-26 NOTE — Patient Instructions (Signed)

## 2018-10-27 LAB — URINE CULTURE
MICRO NUMBER:: 91431218
SPECIMEN QUALITY: ADEQUATE

## 2018-10-31 ENCOUNTER — Telehealth: Payer: Self-pay | Admitting: *Deleted

## 2018-10-31 NOTE — Telephone Encounter (Signed)
Best number [phone] Pt returned your call

## 2018-10-31 NOTE — Telephone Encounter (Signed)
Please call patient let her know that her urine culture did not show bacterial growth so it is okay that she did not finish the antibiotic and she does not need a different one.  She can take an over-the-counter nondrowsy antihistamine like a generic Zyrtec, Allegra or Claritin if needed for itching from the hives.  Hopefully they will resolve on their own in the next couple of days.

## 2018-10-31 NOTE — Telephone Encounter (Signed)
Patient called stating that she was seen last week and given Cephalexin for a UTI. Patient stated that she developed hives from the medication and stopped it Saturday. Patient stated that the hives have improve, but still has some on her back. Patient wants to know what she should take to get rid of the hives. Patient stated that she was afraid to take anything for the hives without getting advice from the office. Patient stated that she needs a different medication for her UTI sent to the pharmacy. Vanessa Jones

## 2018-10-31 NOTE — Telephone Encounter (Signed)
Called and spoke with patient. Understanding verbalized nothing further needed at this time.

## 2018-10-31 NOTE — Telephone Encounter (Signed)
Called and left VM for pt to return call to office.  

## 2019-02-07 ENCOUNTER — Encounter: Payer: Self-pay | Admitting: Family Medicine

## 2019-02-07 NOTE — Progress Notes (Signed)
Dr. Frederico Hamman T. Ediberto Sens, MD, [facility] [place], [address] Phone: [phone] Fax: [phone]  02/08/2019  Patient: Vanessa Jones, MRN: [id number], DOB: [birth year], 78 y.o.  Primary Physician:  Jinny Sanders, MD   Chief Complaint  Patient presents with  . Neck Pain    knot on back of neck  . Hand Pain    Left ring finger   Subjective:   Vanessa Jones is a 78 y.o. very pleasant female patient who presents with the following:  Very nice lady, well-known who presents with some neck pain and a "knot" on the back of her neck along with some L sided 4th digit pain.  Flared up some and had some pain up to her head. No radicular pain at all. Doing a lot better. No radicular pain, mostly a muscular knot at the back of her neck.  Some lost motion.  L 3rd and 4th,  Diffuse finger arthritis.   She has some notable synovitis, particularly at the third and fourth digits on the left at the PIP joints and to a lesser extent the DIP joints.  She has diffuse polyarthralgias in multiple joints.  She has pain in the morning and pain with use.  No knee arthritis.  Past Medical History, Surgical History, Social History, Family History, Problem List, Medications, and Allergies have been reviewed and updated if relevant.  Patient Active Problem List   Diagnosis Date Noted  . Prediabetes 01/30/2016  . Osteoarthritis of left knee 01/30/2016  . Pure hypercholesterolemia 01/30/2016  . Counseling regarding end of life decision making 10/05/2014  . Vitamin D deficiency 10/05/2014  . 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 Osteopenia 05/15/2011  . Essential hypertension, benign 03/20/2008  . Allergic rhinitis 03/20/2008  . DERMATITIS, ALLERGIC 03/20/2008  . OSTEOARTHRITIS 03/20/2008    Past Medical History:  Diagnosis Date  . Allergic dermatitis   . Allergic rhinitis   . Complex tear of medial meniscus of left knee   .  Diverticulitis   . GERD (gastroesophageal reflux disease)   . History of chickenpox   . Hyperlipidemia   . Hypertension   . Osteoarthritis    LEFT KNEE  . Seasonal allergies     Past Surgical History:  Procedure Laterality Date  . CHOLECYSTECTOMY    . COLONOSCOPY    . COLONOSCOPY WITH PROPOFOL N/A 08/17/2017   Procedure: COLONOSCOPY WITH PROPOFOL;  Surgeon: Lollie Sails, MD;  Location: [facility] ENDOSCOPY;  Service: Endoscopy;  Laterality: N/A;  . HEMORRHOID SURGERY    . KNEE ARTHROSCOPY WITH MENISCAL REPAIR Left 08/08/2015   Procedure: KNEE ARTHROSCOPY WITH medial and lateral menisectomy;  Surgeon: Corky Mull, MD;  Location: [facility] ORS;  Service: Orthopedics;  Laterality: Left;  . TUBAL LIGATION      Social History   Socioeconomic History  . Marital status: Married    Spouse name: Not on file  . Number of children: 2  . Years of education: Not on file  . Highest education level: Not on file  Occupational History  . Occupation: Herbalist, retired  Scientific laboratory technician  . Financial resource strain: Not on file  . Food insecurity:    Worry: Not on file    Inability: Not on file  . Transportation needs:    Medical: Not on file    Non-medical: Not on file  Tobacco Use  . Smoking status: Never Smoker  . Smokeless tobacco: Never Used  Substance and Sexual Activity  . Alcohol use: Yes    Comment: Occ glass of wine  . Drug use: No  . Sexual activity: Not on file  Lifestyle  . Physical activity:    Days per week: Not on file    Minutes per session: Not on file  . Stress: Not on file  Relationships  . Social connections:    Talks on phone: Not on file    Gets together: Not on file    Attends religious service: Not on file    Active member of club or organization: Not on file    Attends meetings of clubs or organizations: Not on file    Relationship status: Not on file  . Intimate partner violence:    Fear of current or ex partner: Not on file    Emotionally abused:  Not on file    Physically abused: Not on file    Forced sexual activity: Not on file  Other Topics Concern  . Not on file  Social History Narrative   Retired Herbalist   Married, 2 healthy boys    Regular exercise- yes, walking 3 days a week.   2006 treadmill stress test, low risk.      Moderate diet   No HCOPA, no living will, info given (reviewed 2015)    Family History  Problem Relation Age of Onset  . Hypertension Mother   . Cancer Mother        colon, age 66  . Diabetes Father   . Emphysema Father   . Cancer Sister        breast  . Cancer Brother        bladder  . Coronary artery disease Brother   . Coronary artery disease Maternal Grandmother   . Heart disease Paternal Grandmother        enlarged heart  . Stroke Paternal Grandfather     No Known Allergies  Medication list reviewed and updated in full in [place].  GEN: no acute illness or fever CV: No chest pain or shortness of breath MSK: detailed above Neuro: neurological signs are described above ROS O/w per HPI  Objective:   BP 120/70   Pulse 88   Temp 98.1 F (36.7 C) (Oral)   Ht 5' 4.5" (1.638 m)   Wt 189 lb (85.7 kg)   BMI 31.94 kg/m    GEN: Well-developed,well-nourished,in no acute distress; alert,appropriate and cooperative throughout examination HEENT: Normocephalic and atraumatic without obvious abnormalities. Ears, externally no deformities PULM: Breathing comfortably in no respiratory distress EXT: No clubbing, cyanosis, or edema PSYCH: Normally interactive. Cooperative during the interview. Pleasant. Friendly and conversant. Not anxious or depressed appearing. Normal, full affect.  CERVICAL SPINE EXAM Range of motion: Flexion, extension, lateral bending, and rotation: She has an approximate 35% loss of motion in all directions Pain with terminal motion: Yes, worse on the right. Spinous Processes: NT SCM: NT Upper paracervical muscles: Right. Upper traps: TTP on the  R C5-T1 intact, sensation and motor   She has diffuse hand and wrist osteoarthritis with muscle wasting diffusely.  Diffuse PIP, DIP, and MCP as well as first [place] joint osteoarthritis with extensive degenerative changes on exam and limitation on her ability to make a closed fist.  At the left hand on the third and fourth digit there is significant swelling on the third and fourth digits primarily at the PIP, but to a lesser extent at the DIP joints.  Radiology: No results  found.  Assessment and Plan:   Polyarthralgia - Plan: Sedimentation rate, ANA Screen,IFA,Reflex Titer/Pattern,Reflex Mplx 11 Ab Cascade with IdentRA  Finger pain, left - Plan: Sedimentation rate, ANA Screen,IFA,Reflex Titer/Pattern,Reflex Mplx 11 Ab Cascade with IdentRA  Swelling of finger joint, unspecified laterality - Plan: Sedimentation rate, ANA Screen,IFA,Reflex Titer/Pattern,Reflex Mplx 11 Ab Cascade with IdentRA  Cervicalgia  Multi-joint polyarthralgia and synovitis.  Systemic rheumatological condition is possible.  I am going begin a basic work-up.  Pulse of prednisone for diffuse synovitis and polyarthralgia.  Neck pain and acute spasm and trigger points on the right neck and upper trap.  This is improved within the last day.  Continue with range of motion, and likely will also be improved with oral prednisone burst.  Follow-up: No follow-ups on file.  Meds ordered this encounter  Medications  . predniSONE (DELTASONE) 20 MG tablet    Sig: 2 tabs po daily for 5 days, then 1 tab po daily for 5 days    Dispense:  15 tablet    Refill:  0   Orders Placed This Encounter  Procedures  . Sedimentation rate  . ANA Screen,IFA,Reflex Titer/Pattern,Reflex Mplx 11 Ab Cascade with [employer],  Shanekia Latella T. Brodyn Depuy, MD   Outpatient Encounter Medications as of 02/08/2019  Medication Sig  . Acetaminophen (TYLENOL ARTHRITIS PAIN PO) Take 1 tablet by mouth as needed.  Marland Kitchen aspirin 81 MG tablet Take 81 mg by  mouth as needed.   . cholecalciferol (VITAMIN D) 1000 units tablet Take 1,500 Units by mouth daily.  . hydrochlorothiazide (HYDRODIURIL) 25 MG tablet TAKE 1/2 TABLET BY MOUTH EVERY DAY  . lisinopril (PRINIVIL,ZESTRIL) 10 MG tablet TAKE 1 TABLET BY MOUTH EVERY DAY  . [DISCONTINUED] FLUAD 0.5 ML SUSY ADM 0.5ML IM UTD  . atorvastatin (LIPITOR) 10 MG tablet Take 1 tablet (10 mg total) by mouth daily. (Patient not taking: Reported on 10/26/2018)  . predniSONE (DELTASONE) 20 MG tablet 2 tabs po daily for 5 days, then 1 tab po daily for 5 days   No facility-administered encounter medications on file as of 02/08/2019.

## 2019-02-08 ENCOUNTER — Ambulatory Visit (INDEPENDENT_AMBULATORY_CARE_PROVIDER_SITE_OTHER): Payer: Medicare Other | Admitting: Family Medicine

## 2019-02-08 ENCOUNTER — Encounter: Payer: Self-pay | Admitting: Family Medicine

## 2019-02-08 ENCOUNTER — Other Ambulatory Visit: Payer: Self-pay

## 2019-02-08 VITALS — BP 120/70 | HR 88 | Temp 98.1°F | Ht 64.5 in | Wt 189.0 lb

## 2019-02-08 DIAGNOSIS — M2550 Pain in unspecified joint: Secondary | ICD-10-CM

## 2019-02-08 DIAGNOSIS — M542 Cervicalgia: Secondary | ICD-10-CM | POA: Diagnosis not present

## 2019-02-08 DIAGNOSIS — M79645 Pain in left finger(s): Secondary | ICD-10-CM

## 2019-02-08 DIAGNOSIS — M25449 Effusion, unspecified hand: Secondary | ICD-10-CM

## 2019-02-08 LAB — SEDIMENTATION RATE: Sed Rate: 13 mm/hr (ref 0–30)

## 2019-02-08 MED ORDER — PREDNISONE 20 MG PO TABS: ORAL_TABLET | ORAL | 0 refills | Status: DC

## 2019-02-12 LAB — ANA SCREEN,IFA,REFLEX TITER/PATTERN,REFLEX MPLX 11 AB CASCADE
14-3-3 eta Protein: <0.2 ng/mL (ref <0.2)
Anti Nuclear Antibody(ANA): NEGATIVE
Cyclic Citrullin Peptide Ab: <16 UNITS
Rheumatoid fact SerPl-aCnc: 16 IU/mL — ABNORMAL HIGH (ref <14)

## 2019-02-17 ENCOUNTER — Telehealth: Payer: Self-pay

## 2019-02-17 NOTE — Telephone Encounter (Signed)
Pt called given recommendations Over the counter alleve (naproxen sodium) 1 tablet up to twice a day will likely help  Tylenol up to 3 times a day   She could also try tart cherry juice or concentrate in pill form  Pt verbalized understanding and will try what was recommended

## 2019-07-17 ENCOUNTER — Telehealth: Payer: Self-pay | Admitting: Family Medicine

## 2019-07-17 NOTE — Telephone Encounter (Signed)
Please schedule Medicare Wellness with Nurse and CPE with Dr. Diona Browner.

## 2019-07-20 NOTE — Telephone Encounter (Signed)
Labs 11/6 Medicare wellness 11/6 cpx 11/10

## 2019-10-05 ENCOUNTER — Telehealth: Payer: Self-pay | Admitting: Family Medicine

## 2019-10-05 DIAGNOSIS — E559 Vitamin D deficiency, unspecified: Secondary | ICD-10-CM

## 2019-10-05 DIAGNOSIS — R7303 Prediabetes: Secondary | ICD-10-CM

## 2019-10-05 DIAGNOSIS — E7800 Pure hypercholesterolemia, unspecified: Secondary | ICD-10-CM

## 2019-10-05 NOTE — Telephone Encounter (Signed)
-----   Message from Ellamae Sia sent at 09/25/2019  2:39 PM EDT ----- Regarding: Lab orders for Friday, 11.6.20 Patient is scheduled for CPX labs, please order future labs, Thanks , Karna Christmas

## 2019-10-06 ENCOUNTER — Other Ambulatory Visit: Payer: Self-pay

## 2019-10-06 ENCOUNTER — Other Ambulatory Visit (INDEPENDENT_AMBULATORY_CARE_PROVIDER_SITE_OTHER): Payer: Medicare Other

## 2019-10-06 ENCOUNTER — Ambulatory Visit (INDEPENDENT_AMBULATORY_CARE_PROVIDER_SITE_OTHER): Payer: Medicare Other

## 2019-10-06 DIAGNOSIS — E7800 Pure hypercholesterolemia, unspecified: Secondary | ICD-10-CM

## 2019-10-06 DIAGNOSIS — R7303 Prediabetes: Secondary | ICD-10-CM

## 2019-10-06 DIAGNOSIS — E559 Vitamin D deficiency, unspecified: Secondary | ICD-10-CM

## 2019-10-06 DIAGNOSIS — Z0000 Encounter for general adult medical examination without abnormal findings: Secondary | ICD-10-CM | POA: Diagnosis not present

## 2019-10-06 LAB — COMPREHENSIVE METABOLIC PANEL
ALT: 13 U/L (ref 0–35)
AST: 19 U/L (ref 0–37)
Albumin: 4.2 g/dL (ref 3.5–5.2)
Alkaline Phosphatase: 82 U/L (ref 39–117)
BUN: 17 mg/dL (ref 6–23)
CO2: 26 mEq/L (ref 19–32)
Calcium: 9.3 mg/dL (ref 8.4–10.5)
Chloride: 104 mEq/L (ref 96–112)
Creatinine, Ser: 0.95 mg/dL (ref 0.40–1.20)
GFR: 57.14 mL/min — ABNORMAL LOW (ref >60.00)
Glucose, Bld: 90 mg/dL (ref 70–99)
Potassium: 3.8 mEq/L (ref 3.5–5.1)
Sodium: 139 mEq/L (ref 135–145)
Total Bilirubin: 0.5 mg/dL (ref 0.2–1.2)
Total Protein: 6.8 g/dL (ref 6.0–8.3)

## 2019-10-06 LAB — LIPID PANEL
Cholesterol: 157 mg/dL (ref 0–200)
HDL: 53.1 mg/dL (ref >39.00)
LDL Cholesterol: 89 mg/dL (ref 0–99)
NonHDL: 104.21
Total CHOL/HDL Ratio: 3
Triglycerides: 76 mg/dL (ref 0.0–149.0)
VLDL: 15.2 mg/dL (ref 0.0–40.0)

## 2019-10-06 LAB — VITAMIN D 25 HYDROXY (VIT D DEFICIENCY, FRACTURES): VITD: 46 ng/mL (ref 30.00–100.00)

## 2019-10-06 LAB — HEMOGLOBIN A1C: Hgb A1c MFr Bld: 6.1 % (ref 4.6–6.5)

## 2019-10-06 NOTE — Progress Notes (Signed)
PCP notes:  Health Maintenance: Patient will schedule her mammogram Needs flu vaccine Declined Shingrix   Abnormal Screenings: none   Patient concerns: Patient has been having some shortness of breath for several months now.    Nurse concerns: none   Next PCP appt.: 10/10/2019 @ 11:20 am

## 2019-10-06 NOTE — Progress Notes (Signed)
No critical labs need to be addressed urgently. We will discuss labs in detail at upcoming office visit.   

## 2019-10-06 NOTE — Progress Notes (Signed)
Subjective:   Vanessa Jones is a 78 y.o. female who presents for Medicare Annual (Subsequent) preventive examination.  Review of Systems: N/A   This visit is being conducted through telemedicine via telephone at the nurse health advisor's home address due to the COVID-19 pandemic. This patient has given me verbal consent via doximity to conduct this visit, patient states they are participating from their home address. Patient and myself are on the telephone call. There is no referral for this visit. Some vital signs may be absent or patient reported.    Patient identification: identified by name, DOB, and current address   Cardiac Risk Factors include: advanced age (>51men, >4 women);hypertension;Other (see comment), Risk factor comments: hypercholesterolemia     Objective:     Vitals: There were no vitals taken for this visit.  There is no height or weight on file to calculate BMI.  Advanced Directives 10/06/2019 08/17/2017 02/02/2017 08/08/2015  Does Patient Have a Medical Advance Directive? No No No No  Would patient like information on creating a medical advance directive? No - Patient declined No - Patient declined No - Patient declined No - patient declined information    Tobacco Social History   Tobacco Use  Smoking Status Never Smoker  Smokeless Tobacco Never Used     Counseling given: Not Answered   Clinical Intake:  Pre-visit preparation completed: Yes  Pain : No/denies pain     Nutritional Risks: None Diabetes: No  How often do you need to have someone help you when you read instructions, pamphlets, or other written materials from your doctor or pharmacy?: 1 - Never What is the last grade level you completed in school?: 12th  Interpreter Needed?: No  Information entered by :: CJohnson, LPN  Past Medical History:  Diagnosis Date  . Allergic dermatitis   . Allergic rhinitis   . Complex tear of medial meniscus of left knee   . Diverticulitis   . GERD  (gastroesophageal reflux disease)   . History of chickenpox   . Hyperlipidemia   . Hypertension   . Osteoarthritis    LEFT KNEE  . Seasonal allergies    Past Surgical History:  Procedure Laterality Date  . CHOLECYSTECTOMY    . COLONOSCOPY    . COLONOSCOPY WITH PROPOFOL N/A 08/17/2017   Procedure: COLONOSCOPY WITH PROPOFOL;  Surgeon: Lollie Sails, MD;  Location: [facility] ENDOSCOPY;  Service: Endoscopy;  Laterality: N/A;  . HEMORRHOID SURGERY    . KNEE ARTHROSCOPY WITH MENISCAL REPAIR Left 08/08/2015   Procedure: KNEE ARTHROSCOPY WITH medial and lateral menisectomy;  Surgeon: Corky Mull, MD;  Location: [facility] ORS;  Service: Orthopedics;  Laterality: Left;  . TUBAL LIGATION     Family History  Problem Relation Age of Onset  . Hypertension Mother   . Cancer Mother        colon, age 100  . Diabetes Father   . Emphysema Father   . Cancer Sister        breast  . Cancer Brother        bladder  . Coronary artery disease Brother   . Coronary artery disease Maternal Grandmother   . Heart disease Paternal Grandmother        enlarged heart  . Stroke Paternal Grandfather    Social History   Socioeconomic History  . Marital status: Married    Spouse name: Not on file  . Number of children: 2  . Years of education: Not on file  . Highest  education level: Not on file  Occupational History  . Occupation: Herbalist, retired  Scientific laboratory technician  . Financial resource strain: Not hard at all  . Food insecurity    Worry: Never true    Inability: Never true  . Transportation needs    Medical: No    Non-medical: No  Tobacco Use  . Smoking status: Never Smoker  . Smokeless tobacco: Never Used  Substance and Sexual Activity  . Alcohol use: Yes    Comment: Occ glass of wine  . Drug use: No  . Sexual activity: Not on file  Lifestyle  . Physical activity    Days per week: 0 days    Minutes per session: 0 min  . Stress: Not at all  Relationships  . Social Herbalist on  phone: Not on file    Gets together: Not on file    Attends religious service: Not on file    Active member of club or organization: Not on file    Attends meetings of clubs or organizations: Not on file    Relationship status: Not on file  Other Topics Concern  . Not on file  Social History Narrative   Retired Herbalist   Married, 2 healthy boys    Regular exercise- yes, walking 3 days a week.   2006 treadmill stress test, low risk.      Moderate diet   No HCOPA, no living will, info given (reviewed 2015)    Outpatient Encounter Medications as of 10/06/2019  Medication Sig  . Acetaminophen (TYLENOL ARTHRITIS PAIN PO) Take 1 tablet by mouth as needed.  Marland Kitchen aspirin 81 MG tablet Take 81 mg by mouth as needed.   Marland Kitchen atorvastatin (LIPITOR) 10 MG tablet Take 1 tablet (10 mg total) by mouth daily. (Patient taking differently: Take 10 mg by mouth daily. occasionally)  . cholecalciferol (VITAMIN D) 1000 units tablet Take 1,500 Units by mouth daily.  . hydrochlorothiazide (HYDRODIURIL) 25 MG tablet TAKE 1/2 TABLET BY MOUTH EVERY DAY  . lisinopril (ZESTRIL) 10 MG tablet TAKE 1 TABLET BY MOUTH EVERY DAY  . predniSONE (DELTASONE) 20 MG tablet 2 tabs po daily for 5 days, then 1 tab po daily for 5 days (Patient not taking: Reported on 10/06/2019)   No facility-administered encounter medications on file as of 10/06/2019.     Activities of Daily Living In your present state of health, do you have any difficulty performing the following activities: 10/06/2019  Hearing? Y  Comment some hearing loss, needs hearing aids  Vision? N  Difficulty concentrating or making decisions? N  Walking or climbing stairs? N  Dressing or bathing? N  Doing errands, shopping? N  Preparing Food and eating ? N  Using the Toilet? N  In the past six months, have you accidently leaked urine? N  Do you have problems with loss of bowel control? N  Managing your Medications? N  Managing your Finances? N  Housekeeping  or managing your Housekeeping? N  Some recent data might be hidden    Patient Care Team: Jinny Sanders, MD as PCP - General    Assessment:   This is a routine wellness examination for Vanessa Jones.  Exercise Activities and Dietary recommendations Current Exercise Habits: Home exercise routine, Type of exercise: walking, Time (Minutes): 30, Frequency (Times/Week): 2, Weekly Exercise (Minutes/Week): 60, Intensity: Mild, Exercise limited by: None identified  Goals    . Patient Stated     10/06/2019, I will continue  walking twice a week for 30 minutes.        Fall Risk Fall Risk  10/06/2019 07/01/2018 05/10/2018 02/02/2017 01/30/2016  Falls in the past year? 0 No No No No  Comment - Emmi Telephone Survey: data to providers prior to load - - -  Number falls in past yr: 0 - - - -  Injury with Fall? 0 - - - -  Risk for fall due to : Medication side effect - - - -  Follow up Falls evaluation completed;Falls prevention discussed - - - -   Is the patient's home free of loose throw rugs in walkways, pet beds, electrical cords, etc?   yes      Grab bars in the bathroom? yes      Handrails on the stairs?   yes      Adequate lighting?   yes  Timed Get Up and Go performed: N/A  Depression Screen PHQ 2/9 Scores 10/06/2019 05/10/2018 02/02/2017 01/30/2016  PHQ - 2 Score 0 0 0 0  PHQ- 9 Score 0 - - -     Cognitive Function MMSE - Mini Mental State Exam 10/06/2019  Orientation to time 5  Orientation to Place 5  Registration 3  Attention/ Calculation 5  Recall 3  Language- repeat 1       Mini Cog  Mini-Cog screen was completed. Maximum score is 22. A value of 0 denotes this part of the MMSE was not completed or the patient failed this part of the Mini-Cog screening.  Immunization History  Administered Date(s) Administered  . Influenza Split 12/14/2011, 08/04/2012  . Influenza, High Dose Seasonal PF 09/22/2017  . Influenza,inj,Quad PF,6+ Mos 10/05/2014  . Influenza-Unspecified 07/31/2013  .  Pneumococcal Conjugate-13 10/05/2014  . Pneumococcal Polysaccharide-23 12/28/2008  . Td 10/30/2008  . Zoster 11/30/2012    Qualifies for Shingles Vaccine?Yes  Screening Tests Health Maintenance  Topic Date Due  . INFLUENZA VACCINE  07/01/2019  . MAMMOGRAM  09/28/2019  . TETANUS/TDAP  10/05/2020 (Originally 10/30/2018)  . COLONOSCOPY  08/17/2020  . DEXA SCAN  Completed  . PNA vac Low Risk Adult  Completed    Cancer Screenings: Lung: Low Dose CT Chest recommended if Age 53-80 years, 30 pack-year currently smoking OR have quit w/in 15years. Patient does not qualify. Breast:  Up to date on Mammogram? No, Patient will schedule mammogram appointment  Up to date of Bone Density/Dexa? Yes, completed 02/03/2016 Colorectal: completed 08/17/2017  Additional Screenings:  Hepatitis C Screening: N/A     Plan:   Patient will continue walking twice a week for 30 minutes.   I have personally reviewed and noted the following in the patient's chart:   . Medical and social history . Use of alcohol, tobacco or illicit drugs  . Current medications and supplements . Functional ability and status . Nutritional status . Physical activity . Advanced directives . List of other physicians . Hospitalizations, surgeries, and ER visits in previous 12 months . Vitals . Screenings to include cognitive, depression, and falls . Referrals and appointments  In addition, I have reviewed and discussed with patient certain preventive protocols, quality metrics, and best practice recommendations. A written personalized care plan for preventive services as well as general preventive health recommendations were provided to patient.     Andrez Grime, LPN  [id number]

## 2019-10-06 NOTE — Patient Instructions (Signed)
Vanessa Jones , Thank you for taking time to come for your Medicare Wellness Visit. I appreciate your ongoing commitment to your health goals. Please review the following plan we discussed and let me know if I can assist you in the future.   Screening recommendations/referrals: Colonoscopy: up to date, completed 08/17/2017 Mammogram: Patient will schedule appointment  Bone Density: up to date, completed 02/03/2016 Recommended yearly ophthalmology/optometry visit for glaucoma screening and checkup Recommended yearly dental visit for hygiene and checkup  Vaccinations: Influenza vaccine: will get at physical Pneumococcal vaccine: Completed series Tdap vaccine: declined Shingles vaccine: declined    Advanced directives: Advance directive discussed with you today. Even though you declined this today please call our office should you change your mind and we can give you the proper paperwork for you to fill out.  Conditions/risks identified: hypertension, hypercholesterolemia  Next appointment: 10/10/2019 @ 11:20 am    Preventive Care 65 Years and Older, Female Preventive care refers to lifestyle choices and visits with your health care provider that can promote health and wellness. What does preventive care include?  A yearly physical exam. This is also called an annual well check.  Dental exams once or twice a year.  Routine eye exams. Ask your health care provider how often you should have your eyes checked.  Personal lifestyle choices, including:  Daily care of your teeth and gums.  Regular physical activity.  Eating a healthy diet.  Avoiding tobacco and drug use.  Limiting alcohol use.  Practicing safe sex.  Taking low-dose aspirin every day.  Taking vitamin and mineral supplements as recommended by your health care provider. What happens during an annual well check? The services and screenings done by your health care provider during your annual well check will depend on  your age, overall health, lifestyle risk factors, and family history of disease. Counseling  Your health care provider may ask you questions about your:  Alcohol use.  Tobacco use.  Drug use.  Emotional well-being.  Home and relationship well-being.  Sexual activity.  Eating habits.  History of falls.  Memory and ability to understand (cognition).  Work and work Statistician.  Reproductive health. Screening  You may have the following tests or measurements:  Height, weight, and BMI.  Blood pressure.  Lipid and cholesterol levels. These may be checked every 5 years, or more frequently if you are over 23 years old.  Skin check.  Lung cancer screening. You may have this screening every year starting at age 29 if you have a 30-pack-year history of smoking and currently smoke or have quit within the past 15 years.  Fecal occult blood test (FOBT) of the stool. You may have this test every year starting at age 40.  Flexible sigmoidoscopy or colonoscopy. You may have a sigmoidoscopy every 5 years or a colonoscopy every 10 years starting at age 2.  Hepatitis C blood test.  Hepatitis B blood test.  Sexually transmitted disease (STD) testing.  Diabetes screening. This is done by checking your blood sugar (glucose) after you have not eaten for a while (fasting). You may have this done every 1-3 years.  Bone density scan. This is done to screen for osteoporosis. You may have this done starting at age 108.  Mammogram. This may be done every 1-2 years. Talk to your health care provider about how often you should have regular mammograms. Talk with your health care provider about your test results, treatment options, and if necessary, the need for more tests. Vaccines  Your health care provider may recommend certain vaccines, such as:  Influenza vaccine. This is recommended every year.  Tetanus, diphtheria, and acellular pertussis (Tdap, Td) vaccine. You may need a Td booster  every 10 years.  Zoster vaccine. You may need this after age 31.  Pneumococcal 13-valent conjugate (PCV13) vaccine. One dose is recommended after age 47.  Pneumococcal polysaccharide (PPSV23) vaccine. One dose is recommended after age 60. Talk to your health care provider about which screenings and vaccines you need and how often you need them. This information is not intended to replace advice given to you by your health care provider. Make sure you discuss any questions you have with your health care provider. Document Released: 12/13/2015 Document Revised: 08/05/2016 Document Reviewed: 09/17/2015 Elsevier Interactive Patient Education  2017 [place] Prevention in the Home Falls can cause injuries. They can happen to people of all ages. There are many things you can do to make your home safe and to help prevent falls. What can I do on the outside of my home?  Regularly fix the edges of walkways and driveways and fix any cracks.  Remove anything that might make you trip as you walk through a door, such as a raised step or threshold.  Trim any bushes or trees on the path to your home.  Use bright outdoor lighting.  Clear any walking paths of anything that might make someone trip, such as rocks or tools.  Regularly check to see if handrails are loose or broken. Make sure that both sides of any steps have handrails.  Any raised decks and porches should have guardrails on the edges.  Have any leaves, snow, or ice cleared regularly.  Use sand or salt on walking paths during winter.  Clean up any spills in your garage right away. This includes oil or grease spills. What can I do in the bathroom?  Use night lights.  Install grab bars by the toilet and in the tub and shower. Do not use towel bars as grab bars.  Use non-skid mats or decals in the tub or shower.  If you need to sit down in the shower, use a plastic, non-slip stool.  Keep the floor dry. Clean up any  water that spills on the floor as soon as it happens.  Remove soap buildup in the tub or shower regularly.  Attach bath mats securely with double-sided non-slip rug tape.  Do not have throw rugs and other things on the floor that can make you trip. What can I do in the bedroom?  Use night lights.  Make sure that you have a light by your bed that is easy to reach.  Do not use any sheets or blankets that are too big for your bed. They should not hang down onto the floor.  Have a firm chair that has side arms. You can use this for support while you get dressed.  Do not have throw rugs and other things on the floor that can make you trip. What can I do in the kitchen?  Clean up any spills right away.  Avoid walking on wet floors.  Keep items that you use a lot in easy-to-reach places.  If you need to reach something above you, use a strong step stool that has a grab bar.  Keep electrical cords out of the way.  Do not use floor polish or wax that makes floors slippery. If you must use wax, use non-skid floor wax.  Do  not have throw rugs and other things on the floor that can make you trip. What can I do with my stairs?  Do not leave any items on the stairs.  Make sure that there are handrails on both sides of the stairs and use them. Fix handrails that are broken or loose. Make sure that handrails are as long as the stairways.  Check any carpeting to make sure that it is firmly attached to the stairs. Fix any carpet that is loose or worn.  Avoid having throw rugs at the top or bottom of the stairs. If you do have throw rugs, attach them to the floor with carpet tape.  Make sure that you have a light switch at the top of the stairs and the bottom of the stairs. If you do not have them, ask someone to add them for you. What else can I do to help prevent falls?  Wear shoes that:  Do not have high heels.  Have rubber bottoms.  Are comfortable and fit you well.  Are closed  at the toe. Do not wear sandals.  If you use a stepladder:  Make sure that it is fully opened. Do not climb a closed stepladder.  Make sure that both sides of the stepladder are locked into place.  Ask someone to hold it for you, if possible.  Clearly mark and make sure that you can see:  Any grab bars or handrails.  First and last steps.  Where the edge of each step is.  Use tools that help you move around (mobility aids) if they are needed. These include:  Canes.  Walkers.  Scooters.  Crutches.  Turn on the lights when you go into a dark area. Replace any light bulbs as soon as they burn out.  Set up your furniture so you have a clear path. Avoid moving your furniture around.  If any of your floors are uneven, fix them.  If there are any pets around you, be aware of where they are.  Review your medicines with your doctor. Some medicines can make you feel dizzy. This can increase your chance of falling. Ask your doctor what other things that you can do to help prevent falls. This information is not intended to replace advice given to you by your health care provider. Make sure you discuss any questions you have with your health care provider. Document Released: 09/12/2009 Document Revised: 04/23/2016 Document Reviewed: 12/21/2014 Elsevier Interactive Patient Education  2017 [employer].

## 2019-10-10 ENCOUNTER — Ambulatory Visit (INDEPENDENT_AMBULATORY_CARE_PROVIDER_SITE_OTHER): Payer: Medicare Other | Admitting: Family Medicine

## 2019-10-10 ENCOUNTER — Encounter: Payer: Medicare Other | Admitting: Family Medicine

## 2019-10-10 ENCOUNTER — Other Ambulatory Visit: Payer: Self-pay

## 2019-10-10 ENCOUNTER — Encounter: Payer: Self-pay | Admitting: Family Medicine

## 2019-10-10 VITALS — BP 130/88 | HR 93 | Temp 97.0°F | Ht 64.5 in | Wt 186.8 lb

## 2019-10-10 DIAGNOSIS — E7800 Pure hypercholesterolemia, unspecified: Secondary | ICD-10-CM

## 2019-10-10 DIAGNOSIS — R0600 Dyspnea, unspecified: Secondary | ICD-10-CM | POA: Insufficient documentation

## 2019-10-10 DIAGNOSIS — I10 Essential (primary) hypertension: Secondary | ICD-10-CM | POA: Diagnosis not present

## 2019-10-10 DIAGNOSIS — R0609 Other forms of dyspnea: Secondary | ICD-10-CM | POA: Insufficient documentation

## 2019-10-10 DIAGNOSIS — R7303 Prediabetes: Secondary | ICD-10-CM

## 2019-10-10 DIAGNOSIS — E559 Vitamin D deficiency, unspecified: Secondary | ICD-10-CM

## 2019-10-10 DIAGNOSIS — R0602 Shortness of breath: Secondary | ICD-10-CM | POA: Insufficient documentation

## 2019-10-10 DIAGNOSIS — Z23 Encounter for immunization: Secondary | ICD-10-CM | POA: Diagnosis not present

## 2019-10-10 NOTE — Assessment & Plan Note (Signed)
Stable con low carb diet.

## 2019-10-10 NOTE — Assessment & Plan Note (Signed)
Intermittant and none on exertion. ? Due to allergies vs anxiety. If becoming more persistent.. eval with EKG , labs and CXR.

## 2019-10-10 NOTE — Assessment & Plan Note (Signed)
Now at goal on statin. 

## 2019-10-10 NOTE — Progress Notes (Signed)
Chief Complaint  Patient presents with  . Annual Exam    Part 2    History of Present Illness: HPI  The patient presents for review of chronic health problems. He/She also has the following acute concerns today: SOB for months.  Health Maintenance: Patient will schedule her mammogram Needs flu vaccine Declined Shingrix Abnormal Screenings: none Patient concerns: Patient has been having some shortness of breath for several months now.  10/10/19   SOB, onset x months, intermittant: She reports several times at night wakes to urinate but feels SOB.  No heart racing. No cough, no congestion. Has post nasal drip from allergies. flonase irritates nose.  Snores at night.  She has been more stressed and wonders if could be anxiety.  She is using miralax and metamucil for constipation... gradually getting abck to normal.  Elevated Cholesterol: Now back on statin once a week . Excellent control. Lab Results  Component Value Date   CHOL 157 10/06/2019   HDL 53.10 10/06/2019   LDLCALC 89 10/06/2019   TRIG 76.0 10/06/2019   CHOLHDL 3 10/06/2019  Using medications without problems: Muscle aches:  Diet compliance: low sugar Exercise: walking  2 times a week Other complaints:  Prediabetes  Lab Results  Component Value Date   HGBA1C 6.1 10/06/2019    Hypertension:    Good control on lisinopril and HCTZ. BP Readings from Last 3 Encounters:  10/10/19 130/88  02/08/19 120/70  10/26/18 124/88  Using medication without problems or lightheadedness: none Chest pain with exertion:none Edema:none Short of breath: yes, see above Average home BPs: Other issues:  Vit D def:  At goal  COVID 19 screen No recent travel or known exposure to [place] The patient denies respiratory symptoms of COVID 19 at this time.  The importance of social distancing was discussed today.   Review of Systems  Constitutional: Negative for chills and fever.  HENT: Negative for congestion and ear  pain.   Eyes: Negative for pain and redness.  Respiratory: Positive for shortness of breath. Negative for cough.   Cardiovascular: Negative for chest pain, palpitations and leg swelling.  Gastrointestinal: Negative for abdominal pain, blood in stool, constipation, diarrhea, nausea and vomiting.  Genitourinary: Negative for dysuria.  Musculoskeletal: Negative for falls and myalgias.  Skin: Negative for rash.  Neurological: Negative for dizziness.  Psychiatric/Behavioral: Negative for depression. The patient is not nervous/anxious.       Past Medical History:  Diagnosis Date  . Allergic dermatitis   . Allergic rhinitis   . Complex tear of medial meniscus of left knee   . Diverticulitis   . GERD (gastroesophageal reflux disease)   . History of chickenpox   . Hyperlipidemia   . Hypertension   . Osteoarthritis    LEFT KNEE  . Seasonal allergies     reports that she has never smoked. She has never used smokeless tobacco. She reports current alcohol use. She reports that she does not use drugs.   Current Outpatient Medications:  .  Acetaminophen (TYLENOL ARTHRITIS PAIN PO), Take 1 tablet by mouth as needed., Disp: , Rfl:  .  aspirin 81 MG tablet, Take 81 mg by mouth as needed. , Disp: , Rfl:  .  atorvastatin (LIPITOR) 10 MG tablet, Take 1 tablet (10 mg total) by mouth daily. (Patient taking differently: Take 10 mg by mouth daily. Once a week), Disp: 30 tablet, Rfl: 11 .  cholecalciferol (VITAMIN D) 1000 units tablet, Take 1,500 Units by mouth daily., Disp: , Rfl:  .  hydrochlorothiazide (HYDRODIURIL) 25 MG tablet, TAKE 1/2 TABLET BY MOUTH EVERY DAY, Disp: 45 tablet, Rfl: 0 .  lisinopril (ZESTRIL) 10 MG tablet, TAKE 1 TABLET BY MOUTH EVERY DAY, Disp: 90 tablet, Rfl: 0   Observations/Objective: Blood pressure 130/88, pulse 93, temperature (!) 97 F (36.1 C), temperature source Temporal, height 5' 4.5" (1.638 m), weight 186 lb 12 oz (84.7 kg), SpO2 94 %.  Physical Exam Constitutional:       General: She is not in acute distress.    Appearance: Normal appearance. She is well-developed. She is obese. She is not ill-appearing or toxic-appearing.  HENT:     Head: Normocephalic.     Right Ear: Hearing, tympanic membrane, ear canal and external ear normal.     Left Ear: Hearing, tympanic membrane, ear canal and external ear normal.     Nose: Nose normal.  Eyes:     General: Lids are normal. Lids are everted, no foreign bodies appreciated.     Conjunctiva/sclera: Conjunctivae normal.     Pupils: Pupils are equal, round, and reactive to light.  Neck:     Musculoskeletal: Normal range of motion and neck supple.     Thyroid: No thyroid mass or thyromegaly.     Vascular: No carotid bruit.     Trachea: Trachea normal.  Cardiovascular:     Rate and Rhythm: Normal rate and regular rhythm.     Heart sounds: Normal heart sounds, S1 normal and S2 normal. No murmur. No gallop.   Pulmonary:     Effort: Pulmonary effort is normal. No respiratory distress.     Breath sounds: Normal breath sounds. No wheezing, rhonchi or rales.  Abdominal:     General: Bowel sounds are normal. There is no distension or abdominal bruit.     Palpations: Abdomen is soft. There is no fluid wave or mass.     Tenderness: There is no abdominal tenderness. There is no guarding or rebound.     Hernia: No hernia is present.  Lymphadenopathy:     Cervical: No cervical adenopathy.  Skin:    General: Skin is warm and dry.     Findings: No rash.  Neurological:     Mental Status: She is alert.     Cranial Nerves: No cranial nerve deficit.     Sensory: No sensory deficit.  Psychiatric:        Mood and Affect: Mood is not anxious or depressed.        Speech: Speech normal.        Behavior: Behavior normal. Behavior is cooperative.        Judgment: Judgment normal.      Assessment and Plan   The patient's preventative maintenance and recommended screening tests for an annual wellness exam were reviewed in  full today. Brought up to date unless services declined.  Counselled on the importance of diet, exercise, and its role in overall health and mortality. The patient's FH and SH was reviewed, including their home life, tobacco status, and drug and alcohol status.   Recmmended hearing aid. Colon: 9/2018[facility]ust[facility] Lah, [facility], repeat in 3 years. Family history of colon cancer in mother .  PAP/DVE: PAP not indicated due to age. Has decidednoDVE, no symptoms, no family history of uterine and ovarian cancer.  DEXA:2017 normal improved, recheck in 2022 Mammogram: Last2019,sister with breast cancer. Repeat yearly. Vaccines: Td and PNA, shingles vaccine uptodate.  Given flu vaccine today.  Essential hypertension, benign Well controlled. Continue current medication.   Pure  hypercholesterolemia Now at goal on statin.  Prediabetes Stable con low carb diet.  Vitamin D deficiency At goal.  SOB (shortness of breath) Intermittant and none on exertion. ? Due to allergies vs anxiety. If becoming more persistent.. eval with EKG , labs and CXR.    Eliezer Lofts, MD

## 2019-10-10 NOTE — Patient Instructions (Addendum)
Call to set up yearly mammogram on your own.  Can claritin for allergies.  Can try tylenol ES for pain.  Make follow up appt if shortness of breath is worsening.

## 2019-10-10 NOTE — Assessment & Plan Note (Signed)
Well controlled. Continue current medication.  

## 2019-10-10 NOTE — Assessment & Plan Note (Signed)
At goal.  

## 2019-11-02 DIAGNOSIS — Z1231 Encounter for screening mammogram for malignant neoplasm of breast: Secondary | ICD-10-CM | POA: Diagnosis not present

## 2019-11-02 LAB — HM MAMMOGRAPHY

## 2019-11-08 ENCOUNTER — Encounter: Payer: Self-pay | Admitting: Family Medicine

## 2020-01-12 ENCOUNTER — Other Ambulatory Visit: Payer: Self-pay | Admitting: *Deleted

## 2020-01-12 MED ORDER — HYDROCHLOROTHIAZIDE 25 MG PO TABS: 12.5000 mg | ORAL_TABLET | Freq: Every day | ORAL | 1 refills | Status: DC

## 2020-01-12 MED ORDER — LISINOPRIL 10 MG PO TABS: 10.0000 mg | ORAL_TABLET | Freq: Every day | ORAL | 1 refills | Status: DC

## 2020-01-22 ENCOUNTER — Telehealth: Payer: Self-pay | Admitting: Family Medicine

## 2020-01-22 DIAGNOSIS — J309 Allergic rhinitis, unspecified: Secondary | ICD-10-CM

## 2020-01-22 NOTE — Telephone Encounter (Signed)
Appt scheduled and patient aware.  

## 2020-01-22 NOTE — Telephone Encounter (Signed)
Left message for Vanessa Jones that Dr. Diona Browner put in the referral for an allergist and Rosaria Ferries or Luberta Mutter would be calling her with that appointment.

## 2020-01-22 NOTE — Telephone Encounter (Signed)
Pt called wanting to schedule appointment for sinus issues.  She stated this has been going on since dec.  Offered virtual appointment.  Pt wanted to know if you could refer her to allergy dr without her doing virtual appointment.

## 2020-02-15 ENCOUNTER — Other Ambulatory Visit: Payer: Self-pay

## 2020-02-15 ENCOUNTER — Ambulatory Visit (INDEPENDENT_AMBULATORY_CARE_PROVIDER_SITE_OTHER): Payer: Medicare Other | Admitting: Allergy

## 2020-02-15 ENCOUNTER — Encounter: Payer: Self-pay | Admitting: Allergy

## 2020-02-15 VITALS — BP 136/74 | HR 104 | Temp 97.5°F | Resp 20 | Ht 64.6 in | Wt 186.2 lb

## 2020-02-15 DIAGNOSIS — T7802XD Anaphylactic reaction due to shellfish (crustaceans), subsequent encounter: Secondary | ICD-10-CM

## 2020-02-15 DIAGNOSIS — J3089 Other allergic rhinitis: Secondary | ICD-10-CM | POA: Diagnosis not present

## 2020-02-15 NOTE — Patient Instructions (Signed)
 -   environmental allergy skin testing today is positive to weed pollens, tree pollens, molds, dust mite, cat, dog, mixed feathers (ie. Down in pillows/blankets), mouse.  Allergen avoidance measures discussed and handouts provided  - for nasal drainage recommend use of nasal antihistamine, Astelin 2 sprays 1-2 times a day at this time.  Once drainage improves then you can use it as needed  - recommend to use the Afrin only if you have complete nasal blockage.  Only use Afrin 2 sprays 1-2 times a day for MAX of 3-5 days  - use your saline spray 2 spray each nostril as needed  - since Claritin makes you drowsy you can try Allegra 180mg . Sample provided.  If it does not make your drowsy then you can continue use for better allergy symptom control.     - skin testing for shellfish today is positive to lobster and oyster.  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  Oyster, clam, scallops and other mollusks are cross-reactive with each other.   - recommend avoidance of shellfish  - recommend you have access to self-injectable epinephrine Epipen 0.3mg  at all times  - follow emergency action plan in case of allergic reaction   Follow-up 3-4 months or sooner if needed

## 2020-02-15 NOTE — Progress Notes (Signed)
New Patient Note  RE: Vanessa Jones MRN: [id number] DOB: [birth year] Date of Office Visit: 02/15/2020  Referring provider: Jinny Sanders, MD Primary care provider: Jinny Sanders, MD  Chief Complaint: constant blowing nose  History of present illness: Vanessa Jones is a 79 y.o. female presenting today for consultation for rhinitis.   She states since Dec 2020 she has had constant nasal drainage.  She is using boxes of Kleenex is a day.  She states the drainage has always been clear.  Prior to Dec she has had issues with runny nose but it has gotten worse to the point that she reports it is unbearable.  She has been using Afrin as needed mostly at night since January. Uses nasal saline spray at night as well but feels like she is using this more than Afrin.   She tried taking Claritin 24hr which she states was "too strong" for her as it made her feel a little drowsy and dried her up too much.  She stopped taking it.  She states she takes benadryl mostly.    She does report she can develop rashes after use of lotions with a lot of scent or if she comes into contact with spandex >5%.   No history of eczema, asthma.   Shellfish makes her itch; she states noticed this about 5 years ago.   Review of systems: Review of Systems  Constitutional: Negative.   HENT:       See HPI  Eyes: Negative.   Respiratory: Negative.   Cardiovascular: Negative.   Gastrointestinal: Negative.   Musculoskeletal: Negative.   Skin: Negative.   Neurological: Negative.     All other systems negative unless noted above in HPI  Past medical history: Past Medical History:  Diagnosis Date  . Allergic dermatitis   . Allergic rhinitis   . Complex tear of medial meniscus of left knee   . Diverticulitis   . Eczema   . GERD (gastroesophageal reflux disease)   . History of chickenpox   . Hyperlipidemia   . Hypertension   . Osteoarthritis    LEFT KNEE  . Seasonal allergies     Past surgical  history: Past Surgical History:  Procedure Laterality Date  . CHOLECYSTECTOMY    . COLONOSCOPY    . COLONOSCOPY WITH PROPOFOL N/A 08/17/2017   Procedure: COLONOSCOPY WITH PROPOFOL;  Surgeon: Lollie Sails, MD;  Location: [facility] ENDOSCOPY;  Service: Endoscopy;  Laterality: N/A;  . HEMORRHOID SURGERY    . KNEE ARTHROSCOPY WITH MENISCAL REPAIR Left 08/08/2015   Procedure: KNEE ARTHROSCOPY WITH medial and lateral menisectomy;  Surgeon: Corky Mull, MD;  Location: [facility] ORS;  Service: Orthopedics;  Laterality: Left;  . TUBAL LIGATION      Family history:  Family History  Problem Relation Age of Onset  . Hypertension Mother   . Cancer Mother        colon, age 53  . Diabetes Father   . Emphysema Father   . Cancer Sister        breast  . Asthma Sister   . Cancer Brother        bladder  . Coronary artery disease Brother   . Coronary artery disease Maternal Grandmother   . Heart disease Paternal Grandmother        enlarged heart  . Stroke Paternal Grandfather   . Allergic rhinitis Neg Hx   . Angioedema Neg Hx   . Atopy Neg Hx   .  Eczema Neg Hx   . Immunodeficiency Neg Hx     Social history: She lives in a home with carpeting with gas heating and central cooling.  There is a cat in the home.  There is no concern for water damage, mildew or roaches in the home.  She is a retired Herbalist.  She denies a smoking history.  Medication List: Current Outpatient Medications  Medication Sig Dispense Refill  . Acetaminophen (TYLENOL ARTHRITIS PAIN PO) Take 1 tablet by mouth as needed.    . diphenhydrAMINE (BENADRYL) 25 MG tablet Take 25 mg by mouth every 6 (six) hours as needed.    . hydrochlorothiazide (HYDRODIURIL) 25 MG tablet Take 0.5 tablets (12.5 mg total) by mouth daily. 45 tablet 1  . lisinopril (ZESTRIL) 10 MG tablet Take 1 tablet (10 mg total) by mouth daily. 90 tablet 1  . loratadine (CLARITIN) 10 MG tablet Take 10 mg by mouth daily. Uses as needed.    Marland Kitchen oxymetazoline  (AFRIN) 0.05 % nasal spray Place 1 spray into both nostrils 2 (two) times daily as needed for congestion.    . sodium chloride (OCEAN) 0.65 % SOLN nasal spray Place 1 spray into both nostrils as needed for congestion.    Marland Kitchen azelastine (ASTELIN) 0.1 % nasal spray Use 2 sprays in each nostril 1-2 times daily. 30 mL 5  . EPINEPHrine 0.3 mg/0.3 mL IJ SOAJ injection Inject 0.3 mLs (0.3 mg total) into the muscle once for 1 dose. 2 each 1   No current facility-administered medications for this visit.    Known medication allergies: Allergies  Allergen Reactions  . Cephalexin Shortness Of Breath and Itching     Physical examination: Blood pressure 136/74, pulse (!) 104, temperature (!) 97.5 F (36.4 C), temperature source Temporal, resp. rate 20, height 5' 4.6" (1.641 m), weight 186 lb 3.2 oz (84.5 kg), SpO2 96 %.  General: Alert, interactive, in no acute distress. HEENT: PERRLA, TMs pearly gray, turbinates non-edematous with clear discharge, post-pharynx non erythematous. Neck: Supple without lymphadenopathy. Lungs: Clear to auscultation without wheezing, rhonchi or rales. {no increased work of breathing. CV: Normal S1, S2 without murmurs. Abdomen: Nondistended, nontender. Skin: Warm and dry, without lesions or rashes. Extremities:  No clubbing, cyanosis or edema. Neuro:   Grossly intact.  Diagnositics/Labs: Allergy testing: Environmental allergy skin prick testing is positive to short ragweed, rough marsh elder, maple, oak, pecan, Helminthosporium,phoma betae, candida albicans, tricophyton, d.farina, cat hair, dog epithelium, mixed feathers and mouse. Shellfish panel skin prick testing is positive to lobster and oyster. Allergy testing results were read and interpreted by provider, documented by clinical staff.   Assessment and plan:   Allergic rhinitis with significant nasal drainage  - environmental allergy skin testing today is positive to weed pollens, tree pollens, molds, dust  mite, cat, dog, mixed feathers (ie. Down in pillows/blankets), mouse.  Allergen avoidance measures discussed and handouts provided  - for nasal drainage recommend use of nasal antihistamine, Astelin 2 sprays 1-2 times a day at this time.  Once drainage improves then you can use it as needed  - recommend to use the Afrin only if you have complete nasal blockage.  Only use Afrin 2 sprays 1-2 times a day for MAX of 3-5 days  - use your saline spray 2 spray each nostril as needed  - since Claritin makes you drowsy you can try Allegra 180mg . Sample provided.  If it does not make your drowsy then you can continue use for better allergy  symptom control.    Shellfish allergy  - skin testing for shellfish today is positive to lobster and oyster.  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  Oyster, clam, scallops and other mollusks are cross-reactive with each other.   - recommend avoidance of shellfish  - recommend you have access to self-injectable epinephrine Epipen 0.3mg  at all times  - follow emergency action plan in case of allergic reaction   Follow-up 3-4 months or sooner if needed  I appreciate the opportunity to take part in Radie's care. Please do not hesitate to contact me with questions.  Sincerely,   Prudy Feeler, MD Allergy/Immunology [facility] [place] [facility] [place]

## 2020-02-16 MED ORDER — EPINEPHRINE 0.3 MG/0.3ML IJ SOAJ: 0.3000 mg | Freq: Once | INTRAMUSCULAR | 1 refills | Status: AC

## 2020-02-16 MED ORDER — AZELASTINE HCL 0.1 % NA SOLN: NASAL | 5 refills | Status: DC

## 2020-04-02 ENCOUNTER — Telehealth: Payer: Self-pay | Admitting: Family Medicine

## 2020-04-02 NOTE — Progress Notes (Signed)
  Chronic Care Management   Outreach Note  04/02/2020 Name: SHAMBRIA LEMASTERS MRN: [id number] DOB: 1941/06/11  Referred by: Jinny Sanders, MD Reason for referral : No chief complaint on file.   An unsuccessful telephone outreach was attempted today. The patient was referred to the pharmacist for assistance with care management and care coordination.    This note is not being shared with the patient for the following reason: To respect privacy (The patient or proxy has requested that the information not be shared).  Follow Up Plan:   Raynicia Dukes [employer] Scheduler

## 2020-04-02 NOTE — Progress Notes (Signed)
  Chronic Care Management   Note  04/02/2020 Name: Vanessa Jones MRN: [id number] DOB: [birth year]  Vanessa Jones is a 79 y.o. year old female who is a primary care patient of Bedsole, Amy E, MD. I reached out to Andi Devon by phone today in response to a referral sent by Ms. Vanessa Jones's PCP, Jinny Sanders, MD.   Vanessa Jones was given information about Chronic Care Management services today including:  1. CCM service includes personalized support from designated clinical staff supervised by her physician, including individualized plan of care and coordination with other care providers 2. 24/7 contact phone numbers for assistance for urgent and routine care needs. 3. Service will only be billed when office clinical staff spend 20 minutes or more in a month to coordinate care. 4. Only one practitioner may furnish and bill the service in a calendar month. 5. The patient may stop CCM services at any time (effective at the end of the month) by phone call to the office staff.   Patient agreed to services and verbal consent obtained.    This note is not being shared with the patient for the following reason: To respect privacy (The patient or proxy has requested that the information not be shared).  Follow up plan:   Raynicia Dukes [employer] Scheduler

## 2020-05-10 NOTE — Chronic Care Management (AMB) (Deleted)
Chronic Care Management Pharmacy  Name: Vanessa Jones  MRN: [id number] DOB: 09-19-1941  Chief Complaint/ HPI  [place],  79 y.o. , female presents for their Initial CCM visit with the clinical pharmacist via telephone due to COVID-19 Pandemic.  PCP : Jinny Sanders, MD  Their chronic conditions include: HTN, allergic rhinitis, osteoarthritis, osteopenia, hypercholesterolemia, prediabetes   Office Visits: 10/10/19: Patient presented to Dr. Diona Browner for HTN follow-up. Patient with worsening shortness of breath for several months. Patient restarted atorvastatin 10 mg, taking once weekly.   Consult Visit: 02/15/20: Patient referred to Dr. Nelva Bush (allergy) for allergic rhinitis. Patient with constant nasal drainage. Using Afrin frequently, prefers diphenhydramine over loratadine. Allergy testing done, patient prescribed epinephrine pen and azelastine nasal spray. Aspirin, atorvastatin, and cholecaliferol stopped (patient no longer taking).  Medications: Outpatient Encounter Medications as of 05/16/2020  Medication Sig  . Acetaminophen (TYLENOL ARTHRITIS PAIN PO) Take 1 tablet by mouth as needed.  Marland Kitchen azelastine (ASTELIN) 0.1 % nasal spray Use 2 sprays in each nostril 1-2 times daily.  . diphenhydrAMINE (BENADRYL) 25 MG tablet Take 25 mg by mouth every 6 (six) hours as needed.  . hydrochlorothiazide (HYDRODIURIL) 25 MG tablet Take 0.5 tablets (12.5 mg total) by mouth daily.  Marland Kitchen lisinopril (ZESTRIL) 10 MG tablet Take 1 tablet (10 mg total) by mouth daily.  Marland Kitchen loratadine (CLARITIN) 10 MG tablet Take 10 mg by mouth daily. Uses as needed.  Marland Kitchen oxymetazoline (AFRIN) 0.05 % nasal spray Place 1 spray into both nostrils 2 (two) times daily as needed for congestion.  . sodium chloride (OCEAN) 0.65 % SOLN nasal spray Place 1 spray into both nostrils as needed for congestion.   No facility-administered encounter medications on file as of 05/16/2020.     Current Diagnosis/Assessment:  Goals  Addressed   None     Pre-Diabetes   Recent Relevant Labs: Lab Results  Component Value Date/Time   HGBA1C 6.1 10/06/2019 08:07 AM   HGBA1C 6.1 04/26/2018 08:17 AM     Checking BG: Never  Recent FBG Readings: n/a Recent pre-meal BG readings: n/a Recent 2hr PP BG readings:  n/a Recent HS BG readings: n/a  Patient has failed these meds in past: n/a Patient is currently {CHL Controlled/[phone]} on the following medications: None  Last diabetic Foot exam: No results found for: HMDIABEYEEXA  Last diabetic Eye exam: No results found for: HMDIABFOOTEX   We discussed: {CHL HP Upstream Pharmacy [phone]}  Plan  Continue {CHL HP Upstream Pharmacy Plans:[phone]} and  Hypertension   BP today is:  {CHL HP UPSTREAM Pharmacist BP ranges:[id number]}  Office blood pressures are  BP Readings from Last 3 Encounters:  02/15/20 136/74  10/10/19 130/88  02/08/19 120/70    Patient has failed these meds in the past: *** Patient is currently {CHL Controlled/[phone]} on the following medications:   HCTZ 12.5 mg daily   Lisinopril 10 mg daily  Patient checks BP at home {CHL HP BP Monitoring [phone]}  Patient home BP readings are ranging: ***  We discussed {CHL HP Upstream Pharmacy [phone]}  Plan  Continue {CHL HP Upstream Pharmacy Plans:[phone]}   Hyperlipidemia   Lipid Panel     Component Value Date/Time   CHOL 157 10/06/2019 0807   TRIG 76.0 10/06/2019 0807   HDL 53.10 10/06/2019 0807   LDLCALC 89 10/06/2019 0807     The 10-year ASCVD risk score (Goff DC Jr., et al., 2013) is: 25.5%   Values used to calculate the score:  Age: 36 years     Sex: Female     Is Non-Hispanic African American: No     Diabetic: No     Tobacco smoker: No     Systolic Blood Pressure: 765 mmHg     Is BP treated: Yes     HDL Cholesterol: 53.1 mg/dL     Total Cholesterol: 157 mg/dL   Patient has failed these  meds in past: Atorvastatin 10 mg, rosuvastatin 20 mg  Patient is currently {CHL Controlled/Uncontrolled:[phone]} on the following medications: none  We discussed:  {CHL HP Upstream Pharmacy discussion:[phone]}  Plan  Continue {CHL HP Upstream Pharmacy [phone]}  Allergic Rhinitis   Patient has failed these meds in past: *** Patient is currently {CHL Controlled/Uncontrolled:[phone]} on the following medications:   Azelastine 0.1% nasal spray   Diphenhydramine 25 mg q6hr PRN   Epinephrine 0.3 mg IM PRN   Loratadine 10 mg daily PRN   Oxymetazoline 0.05% nasal spray 1 spray BID PRN   Sodium chloride 0.65% nasal spray PRN   We discussed:  ***  Plan  Continue {CHL HP Upstream Pharmacy [phone]}   Osteopenia / Osteoporosis   Last DEXA Scan: 02/13/16   T-Score femoral neck: -0.6  T-Score total hip: n/a  T-Score lumbar spine: -0.8  T-Score forearm radius: n/a  10-year probability of major osteoporotic fracture: n/a  10-year probability of hip fracture: n/a  VITD  Date Value Ref Range Status  10/06/2019 46.00 30.00 - 100.00 ng/mL Final     Patient {is;is not an osteoporosis candidate:23886}  Patient has failed these meds in past: *** Patient is currently {CHL Controlled/Uncontrolled:[phone]} on the following medications: ***  We discussed:  {Osteoporosis Counseling:23892}  Plan  Continue {CHL HP Upstream Pharmacy [phone]}   Misc/OTC   APAP arthritis 1 tablet PRN   We discussed:  ***  Plan  Continue {CHL HP Upstream Pharmacy YYTKP:[id number]}  Vaccines   Reviewed and discussed patient's vaccination history.    Immunization History  Administered Date(s) Administered  . Fluad Quad(high Dose 65+) 10/10/2019  . Influenza Split 12/14/2011, 08/04/2012  . Influenza, High Dose Seasonal PF 09/22/2017  . Influenza,inj,Quad PF,6+ Mos 10/05/2014  . Influenza-Unspecified 07/31/2013, 08/30/2018  . Pneumococcal Conjugate-13  10/05/2014  . Pneumococcal Polysaccharide-23 12/28/2008  . Td 10/30/2008  . Zoster 11/30/2012    Plan  Recommended patient receive *** vaccine in *** office/pharmacy.   Medication Management   Pt uses [place] pharmacy for all medications Uses pill box? {Yes or If no, why not?:20788} Pt endorses ***% compliance  We discussed: ***  Plan  {US Pharmacy TZGY:17494}    Follow up: *** month phone visit  ***

## 2020-05-16 ENCOUNTER — Ambulatory Visit: Payer: Medicare Other

## 2020-05-16 ENCOUNTER — Other Ambulatory Visit: Payer: Self-pay

## 2020-05-16 DIAGNOSIS — E7800 Pure hypercholesterolemia, unspecified: Secondary | ICD-10-CM

## 2020-05-16 DIAGNOSIS — I10 Essential (primary) hypertension: Secondary | ICD-10-CM

## 2020-05-16 NOTE — Chronic Care Management (AMB) (Signed)
Chronic Care Management Pharmacy  Name: Vanessa Jones  MRN: [id number] DOB: [birth year]  Chief Complaint/ HPI  [place],  79 y.o., female presents for their Initial CCM visit with the clinical pharmacist via telephone.  PCP : Jinny Sanders, MD  Their chronic conditions include: HTN, allergic rhinitis, osteoarthritis, osteopenia, hypercholesterolemia, prediabetes   Patient concerns:  Requesting referral for hearing aid (notified PCP by telephone note)  Office Visits:  10/10/19: Patient presented to Dr. Diona Browner for HTN follow-up. Patient with worsening shortness of breath for several months. Patient restarted atorvastatin 10 mg, taking once weekly.   Consult Visit:  02/15/20: Patient referred to Dr. Nelva Bush (allergy) for allergic rhinitis. Patient with constant nasal drainage. Using Afrin frequently, prefers diphenhydramine over loratadine. Allergy testing done, patient prescribed epinephrine pen and azelastine nasal spray. Aspirin, atorvastatin, and cholecalciferol stopped (patient no longer taking).  Medications: Outpatient Encounter Medications as of 05/16/2020  Medication Sig  . Acetaminophen (TYLENOL ARTHRITIS PAIN PO) Take 1 tablet by mouth as needed.  Marland Kitchen azelastine (ASTELIN) 0.1 % nasal spray Use 2 sprays in each nostril 1-2 times daily.  . diphenhydrAMINE (BENADRYL) 25 MG tablet Take 25 mg by mouth every 6 (six) hours as needed.  . hydrochlorothiazide (HYDRODIURIL) 25 MG tablet Take 0.5 tablets (12.5 mg total) by mouth daily.  Marland Kitchen lisinopril (ZESTRIL) 10 MG tablet Take 1 tablet (10 mg total) by mouth daily.  Marland Kitchen loratadine (CLARITIN) 10 MG tablet Take 10 mg by mouth daily. Uses as needed.  Marland Kitchen oxymetazoline (AFRIN) 0.05 % nasal spray Place 1 spray into both nostrils 2 (two) times daily as needed for congestion.  . sodium chloride (OCEAN) 0.65 % SOLN nasal spray Place 1 spray into both nostrils as needed for congestion.   No facility-administered encounter medications on  file as of 05/16/2020.   Current Diagnosis/Assessment:   Emergency planning/management officer Strain: Low Risk   . Difficulty of Paying Living Expenses: Not very hard   Goals    . Patient Stated     10/06/2019, I will continue walking twice a week for 30 minutes.     . Pharmacy Care Plan     CARE PLAN ENTRY  Current Barriers:  . Chronic Disease Management support, education, and care coordination needs related to Hypertension and Hyperlipidemia   Hypertension BP Readings from Last 3 Encounters:  02/15/20 136/74  10/10/19 130/88  02/08/19 120/70 .  Pharmacist Clinical Goal(s): o Over the next 3 months, patient will work with PharmD and providers to maintain BP goal <140/90 mmHg . Current regimen:   HCTZ 12.5 mg - 1 tablet daily   Lisinopril 10 mg - 1 tablet daily . Interventions: o Counseled on importance of avoiding missed doses to maintain stable blood pressure and receive full benefit from medication  . Patient self care activities - Over the next 3 months, patient will: o Consider using a pillbox to prevent missed doses  o Ensure daily salt intake < 2300 mg/day  Hyperlipidemia Lab Results  Component Value Date/Time   LDLCALC 89 10/06/2019 08:07 AM .  Pharmacist Clinical Goal(s): o Over the next 3 months, patient will work with PharmD and providers to maintain LDL goal < 100 . Current regimen:  o No pharmacotherapy . Interventions: o Discussed goals for reducing cardiovascular risk including exercising 150 minutes weekly and eating a heart healthy diet . Patient self care activities - Over the next 3 months, patient will: . Slowly increase exercise with goal of 30 minutes, 5 days per week .  Incorporate a healthy diet high in vegetables, fruits and whole grains with low-fat dairy products, chicken, fish, legumes, non-tropical vegetable oils and nuts. Limit intake of sweets, sugar-sweetened beverages and red meats. . May discontinue aspirin as risks of bleeding usually outweigh benefits  in adults 53 or older   Allergies . Pharmacist Clinical Goal(s) o Over the next 3 months, patient will work with PharmD and providers to reduce daytime fatigue and maintain allergy symptom control . Current regimen:   Diphenhydramine 25 mg - 1 tablet every other day as needed  Sodium chloride 0.65% nasal spray - 1 spray in each nostril as needed . Interventions: o Discussed less sedating and less drying antihistamines such as cetirizine (Zyrtec), levocetirizine (Xyzal). Benadryl is sedating and may worsen daytime fatigue.  . Patient self care activities - Over the next 3 months, patient will: o Consider switching Benadryl to a less sedating and less drying antihistamine such as cetirizine (Zyrtec) or levocetirizine (Xyzal). o If taking Benadryl, try taking it at bedtime to reduce daytime fatigue. o Try a daily multivitamin if not eating a well balanced diet.   Initial goal documentation       Pre-Diabetes   Recent Relevant Labs: Lab Results  Component Value Date/Time   HGBA1C 6.1 10/06/2019 08:07 AM   HGBA1C 6.1 04/26/2018 08:17 AM   Checking BG: Never  Patient has failed these meds in past: none Patient is currently controlled on the following medications: No pharmacotherapy   We discussed: diet and exercise management; A1c  Plan: Continue control with diet and exercise   Hypertension   Office blood pressures are  BP Readings from Last 3 Encounters:  02/15/20 136/74  10/10/19 130/88  02/08/19 120/70   Patient has failed these meds in the past: none Patient is currently controlled on the following medications:   HCTZ 12.5 mg - 1 tablet daily   Lisinopril 10 mg - 1 tablet daily   Patient checks BP at home infrequently Patient home BP readings are ranging: none reported  Adherence: refills > 30 days past due; reports usually taking right after breakfast but forgets about 3 days/week  We discussed: does not use pillbox, recommend pillbox   Plan: Continue  current medications; Recommend using a pillbox to prevent missed doses.    Hyperlipidemia/CV prevention   Lipid Panel     Component Value Date/Time   CHOL 157 10/06/2019 0807   TRIG 76.0 10/06/2019 0807   HDL 53.10 10/06/2019 0807   LDLCALC 89 10/06/2019 0807    LDL goal < 100 Patient has failed these meds in past: atorvastatin 10 mg, rosuvastatin 20 mg - causes knee pain Patient is currently controlled on the following medications:   No pharmacotherapy  Aspirin 81 mg - 1 tablet daily (only taking 3 days/week)  We discussed: reports off of statin for 4-5 years thought it may be worsening osteoarthritis; prefers to manage by limits red meat, tries to limits cholesterol content in foods  Exercise: walks with husband twice a week  Assessment: risks may outweigh benefits of aspirin for primary prevention in adults 48 or older without diabetes   Plan: Continue control with diet and exercise; May discontinue aspirin for primary prevention.  Allergic Rhinitis   Patient has failed these meds in past: none  Patient is currently controlled on the following medications:   Diphenhydramine 25 mg q6hr PRN    Sodium chloride 0.65% nasal spray PRN   Epinephrine 0.3 mg IM PRN  Adherence: Reports taking Benadryl about every  other day for sinus drainage.   Symptoms: Worse in the fall. Reports had allergy test in March (dust, trees). Finds benadryl less strong compared to loratadine. Uses flonase sparingly because it dries out nasal passages. Taking nasal saline spray as well. Avoiding Afrin. Has not needed epinephrine.   Denies drowsiness with Benadryl, however reports low energy --> recommended try switching to bedtime dosing to reduce risk of daytime drowsiness/falls or preferably switch to 2nd gen antihistamine   Plan: Continue current medications; Take Benadryl at bedtime to avoid daytime fatigue.   Osteopenia   Last DEXA Scan: 02/13/16   T-Score femoral neck: -0.6  T-Score total hip:  n/a  T-Score lumbar spine: -0.8  T-Score forearm radius: n/a  10-year probability of major osteoporotic fracture: n/a  10-year probability of hip fracture: n/a  VITD  Date Value Ref Range Status  10/06/2019 46.00 30.00 - 100.00 ng/mL Final    Patient has failed these meds in past: none reported Patient is currently controlled on the following medications:   Vitamin D3 1000 IU - 1 capsule daily  We discussed:  Recommend weight-bearing and muscle strengthening exercises for building and maintaining bone density.   Plan: Continue current medications  Misc/OTC    APAP arthritis 1 tablet PRN (takes for OA)  Probiotic - 1 gummy daily (takes for constipation)  Fiber capsule [facility] - 1 daily (takes for constipation)  We discussed:  Avoids NSAIDs  Plan: Continue current medications  Vaccines   Reviewed and discussed patient's vaccination history.    Immunization History  Administered Date(s) Administered  . Fluad Quad(high Dose 65+) 10/10/2019  . Influenza Split 12/14/2011, 08/04/2012  . Influenza, High Dose Seasonal PF 09/22/2017  . Influenza,inj,Quad PF,6+ Mos 10/05/2014  . Influenza-Unspecified 07/31/2013, 08/30/2018  . Pneumococcal Conjugate-13 10/05/2014  . Pneumococcal Polysaccharide-23 12/28/2008  . Td 10/30/2008  . Zoster 11/30/2012   Plan: Recommend patient receive Shingrix, Tdap  Medication Management   Pt uses [facility] for all medications Uses pill box? No Pt endorses 60-70% compliance Denies cost concerns   Plan: recommend using a pillbox to improve adherence  Follow up: 3 months phone visit  Debbora Dus, PharmD Clinical Pharmacist [facility] at [facility] [phone]

## 2020-05-29 ENCOUNTER — Telehealth: Payer: Self-pay

## 2020-05-29 DIAGNOSIS — H9193 Unspecified hearing loss, bilateral: Secondary | ICD-10-CM

## 2020-05-29 NOTE — Patient Instructions (Addendum)
Dear Andi Devon,  It was a pleasure meeting you during our initial appointment on May 16, 2020. Below is a summary of the goals we discussed and components of chronic care management. Please contact me anytime with questions or concerns.   Visit Information  Goals Addressed            This Visit's Progress   . Pharmacy Care Plan       CARE PLAN ENTRY  Current Barriers:  . Chronic Disease Management support, education, and care coordination needs related to Hypertension and Hyperlipidemia   Hypertension BP Readings from Last 3 Encounters:  02/15/20 136/74  10/10/19 130/88  02/08/19 120/70 .  Pharmacist Clinical Goal(s): o Over the next 3 months, patient will work with PharmD and providers to maintain BP goal <140/90 mmHg . Current regimen:   HCTZ 12.5 mg - 1 tablet daily   Lisinopril 10 mg - 1 tablet daily . Interventions: o Counseled on importance of avoiding missed doses to maintain stable blood pressure and receive full benefit from medication  . Patient self care activities - Over the next 3 months, patient will: o Consider using a pillbox to prevent missed doses  o Ensure daily salt intake < 2300 mg/day  Hyperlipidemia Lab Results  Component Value Date/Time   LDLCALC 89 10/06/2019 08:07 AM .  Pharmacist Clinical Goal(s): o Over the next 3 months, patient will work with PharmD and providers to maintain LDL goal < 100 . Current regimen:  o No pharmacotherapy . Interventions: o Discussed goals for reducing cardiovascular risk including exercising 150 minutes weekly and eating a heart healthy diet . Patient self care activities - Over the next 3 months, patient will: . Slowly increase exercise with goal of 30 minutes, 5 days per week . Incorporate a healthy diet high in vegetables, fruits and whole grains with low-fat dairy products, chicken, fish, legumes, non-tropical vegetable oils and nuts. Limit intake of sweets, sugar-sweetened beverages and red  meats. . May discontinue aspirin as risks of bleeding usually outweigh benefits in adults 27 or older   Allergies . Pharmacist Clinical Goal(s) o Over the next 3 months, patient will work with PharmD and providers to reduce daytime fatigue and maintain allergy symptom control . Current regimen:   Diphenhydramine 25 mg - 1 tablet every other day as needed  Sodium chloride 0.65% nasal spray - 1 spray in each nostril as needed . Interventions: o Discussed less sedating and less drying antihistamines such as cetirizine (Zyrtec), levocetirizine (Xyzal). Benadryl is sedating and may worsen daytime fatigue.  . Patient self care activities - Over the next 3 months, patient will: o Consider switching Benadryl to a less sedating and less drying antihistamine such as cetirizine (Zyrtec) or levocetirizine (Xyzal). o If taking Benadryl, try taking it at bedtime to reduce daytime fatigue. o Try a daily multivitamin if not eating a well balanced diet.   Initial goal documentation       Ms. Tarry was given information about Chronic Care Management services today including:  1. CCM service includes personalized support from designated clinical staff supervised by her physician, including individualized plan of care and coordination with other care providers 2. 24/7 contact phone numbers for assistance for urgent and routine care needs. 3. Standard insurance, coinsurance, copays and deductibles apply for chronic care management only during months in which we provide at least 20 minutes of these services. Most insurances cover these services at 100%, however patients may be responsible for any copay, coinsurance  and/or deductible if applicable. This service may help you avoid the need for more expensive face-to-face services. 4. Only one practitioner may furnish and bill the service in a calendar month. 5. The patient may stop CCM services at any time (effective at the end of the month) by phone call to  the office staff.  Patient agreed to services and verbal consent obtained.   The patient verbalized understanding of instructions provided today and agreed to receive a mailed copy of patient instruction and/or educational materials. Telephone follow up appointment with pharmacy team member scheduled for: August 28, 2020 at 9:30 AM (telephone visit)  Debbora Dus, PharmD Clinical Pharmacist [facility] at [facility] [phone]     Why follow it? Research shows. . Those who follow the Mediterranean diet have a reduced risk of heart disease  . The diet is associated with a reduced incidence of Parkinson's and Alzheimer's diseases . People following the diet may have longer life expectancies and lower rates of chronic diseases  . The Dietary Guidelines for Americans recommends the Mediterranean diet as an eating plan to promote health and prevent disease  What Is the Mediterranean Diet?  . Healthy eating plan based on typical foods and recipes of Mediterranean-style cooking . The diet is primarily a plant based diet; these foods should make up a majority of meals   Starches - Plant based foods should make up a majority of meals - They are an important sources of vitamins, minerals, energy, antioxidants, and fiber - Choose whole grains, foods high in fiber and minimally processed items  - Typical grain sources include wheat, oats, barley, corn, brown rice, bulgar, farro, millet, polenta, couscous  - Various types of beans include chickpeas, lentils, fava beans, black beans, white beans   Fruits  Veggies - Large quantities of antioxidant rich fruits & veggies; 6 or more servings  - Vegetables can be eaten raw or lightly drizzled with oil and cooked  - Vegetables common to the traditional Mediterranean Diet include: artichokes, arugula, beets, broccoli, brussel sprouts, cabbage, carrots, celery, collard greens, cucumbers, eggplant, kale, leeks, lemons, lettuce, mushrooms,  okra, onions, peas, peppers, potatoes, pumpkin, radishes, rutabaga, shallots, spinach, sweet potatoes, turnips, zucchini - Fruits common to the Mediterranean Diet include: apples, apricots, avocados, cherries, clementines, dates, figs, grapefruits, grapes, melons, nectarines, oranges, peaches, pears, pomegranates, strawberries, tangerines  Fats - Replace butter and margarine with healthy oils, such as olive oil, canola oil, and tahini  - Limit nuts to no more than a handful a day  - Nuts include walnuts, almonds, pecans, pistachios, pine nuts  - Limit or avoid candied, honey roasted or heavily salted nuts - Olives are central to the [employer] - can be eaten whole or used in a variety of dishes   Meats Protein - Limiting red meat: no more than a few times a month - When eating red meat: choose lean cuts and keep the portion to the size of deck of cards - Eggs: approx. 0 to 4 times a week  - Fish and lean poultry: at least 2 a week  - Healthy protein sources include, chicken, [place], lean beef, lamb - Increase intake of seafood such as tuna, salmon, trout, mackerel, shrimp, scallops - Avoid or limit high fat processed meats such as sausage and bacon  Dairy - Include moderate amounts of low fat dairy products  - Focus on healthy dairy such as fat free yogurt, skim milk, low or reduced fat cheese - Limit dairy products  higher in fat such as whole or 2% milk, cheese, ice cream  Alcohol - Moderate amounts of red wine is ok  - No more than 5 oz daily for women (all ages) and men older than age 2  - No more than 10 oz of wine daily for men younger than 70  Other - Limit sweets and other desserts  - Use herbs and spices instead of salt to flavor foods  - Herbs and spices common to the traditional Mediterranean Diet include: basil, bay leaves, chives, cloves, cumin, fennel, garlic, lavender, marjoram, mint, oregano, parsley, pepper, rosemary, sage, savory, sumac, tarragon, thyme   It's not  just a diet, it's a lifestyle:  . The Mediterranean diet includes lifestyle factors typical of those in the region  . Foods, drinks and meals are best eaten with others and savored . Daily physical activity is important for overall good health . This could be strenuous exercise like running and aerobics . This could also be more leisurely activities such as walking, housework, yard-work, or taking the stairs . Moderation is the key; a balanced and healthy diet accommodates most foods and drinks . Consider portion sizes and frequency of consumption of certain foods   Meal Ideas & Options:  . Breakfast:  o Whole wheat toast or whole wheat English muffins with peanut butter & hard boiled egg o Steel cut oats topped with apples & cinnamon and skim milk  o Fresh fruit: banana, strawberries, melon, berries, peaches  o Smoothies: strawberries, bananas, greek yogurt, peanut butter o Low fat greek yogurt with blueberries and granola  o Egg white omelet with spinach and mushrooms o Breakfast couscous: whole wheat couscous, apricots, skim milk, cranberries  . Sandwiches:  o Hummus and grilled vegetables (peppers, zucchini, squash) on whole wheat bread   o Grilled chicken on whole wheat pita with lettuce, tomatoes, cucumbers or tzatziki  o Tuna salad on whole wheat bread: tuna salad made with greek yogurt, olives, red peppers, capers, green onions o Garlic rosemary lamb pita: lamb sauted with garlic, rosemary, salt & pepper; add lettuce, cucumber, greek yogurt to pita - flavor with lemon juice and black pepper  . Seafood:  o Mediterranean grilled salmon, seasoned with garlic, basil, parsley, lemon juice and black pepper o Shrimp, lemon, and spinach whole-grain pasta salad made with low fat greek yogurt  o Seared scallops with lemon orzo  o Seared tuna steaks seasoned salt, pepper, coriander topped with tomato mixture of olives, tomatoes, olive oil, minced garlic, parsley, green onions and cappers   . Meats:  o Herbed greek chicken salad with kalamata olives, cucumber, feta  o Red bell peppers stuffed with spinach, bulgur, lean ground beef (or lentils) & topped with feta   o Kebabs: skewers of chicken, tomatoes, onions, zucchini, squash  o [place] burgers: made with red onions, mint, dill, lemon juice, feta cheese topped with roasted red peppers . Vegetarian o Cucumber salad: cucumbers, artichoke hearts, celery, red onion, feta cheese, tossed in olive oil & lemon juice  o Hummus and whole grain pita points with a greek salad (lettuce, tomato, feta, olives, cucumbers, red onion) o Lentil soup with celery, carrots made with vegetable broth, garlic, salt and pepper  o Tabouli salad: parsley, bulgur, mint, scallions, cucumbers, tomato, radishes, lemon juice, olive oil, salt and pepper.

## 2020-05-29 NOTE — Telephone Encounter (Signed)
Patient requesting referral for hearing aid evaluation. Reports she was evaluated several years ago but would like second opinion.   Vanessa Jones, PharmD Clinical Pharmacist [facility] at [facility] [phone]

## 2020-05-31 NOTE — Telephone Encounter (Signed)
[place] or [place]?

## 2020-05-31 NOTE — Telephone Encounter (Signed)
Spoke with Ms. [place].  She would prefer [place].

## 2020-06-04 ENCOUNTER — Other Ambulatory Visit: Payer: Self-pay

## 2020-06-04 ENCOUNTER — Encounter: Payer: Self-pay | Admitting: Family Medicine

## 2020-06-04 ENCOUNTER — Ambulatory Visit (INDEPENDENT_AMBULATORY_CARE_PROVIDER_SITE_OTHER): Payer: Medicare Other | Admitting: Family Medicine

## 2020-06-04 VITALS — BP 122/82 | HR 86 | Temp 98.3°F | Ht 64.6 in | Wt 182.1 lb

## 2020-06-04 DIAGNOSIS — K5792 Diverticulitis of intestine, part unspecified, without perforation or abscess without bleeding: Secondary | ICD-10-CM | POA: Diagnosis not present

## 2020-06-04 MED ORDER — CIPROFLOXACIN HCL 750 MG PO TABS: 750.0000 mg | ORAL_TABLET | Freq: Two times a day (BID) | ORAL | 0 refills | Status: DC

## 2020-06-04 MED ORDER — METRONIDAZOLE 500 MG PO TABS: 500.0000 mg | ORAL_TABLET | Freq: Four times a day (QID) | ORAL | 0 refills | Status: DC

## 2020-06-04 NOTE — Patient Instructions (Addendum)
Continue twice daily miralax.  Increase water intake. Start cipro and flagyl course.  Go to ER if fever on antibiotics or increasing pain.

## 2020-06-04 NOTE — Progress Notes (Signed)
Chief Complaint  Patient presents with  . Diverticulitis    x 2 weeks ago. Pt unable to have a BM. Pt having cramping. Taking Dulcolax and started liquid diet yesterday.     History of Present Illness: HPI    79 year old female patient with history of diverticulitis presents today to office with 2 weeks of LLQ abdominal cramping intermittently. Worse in last 3-4 days.  Constipation in 2 weeks.Marland Kitchen last BM 3-4 days ago... small skinny stools.  No blood.  Able to pass gas.  This feels similar to  Diverticulitis in past. Last attack last year.   Using miralax 1-2 times daily, has tried glycerin suppository. Eating clear liquid diet.   no fever, no dysuria  Last colonoscopy 2018 Dr. Gustavo Lah, polyps and tic in sigmoid and descending colon  Allergy to cephalexin.  This visit occurred during the SARS-CoV-2 public health emergency.  Safety protocols were in place, including screening questions prior to the visit, additional usage of staff PPE, and extensive cleaning of exam room while observing appropriate contact time as indicated for disinfecting solutions.   COVID 19 screen:  No recent travel or known exposure to COVID19 The patient denies respiratory symptoms of COVID 19 at this time. The importance of social distancing was discussed today.     Review of Systems  Constitutional: Negative for chills and fever.  HENT: Negative for congestion and ear pain.   Eyes: Negative for pain and redness.  Respiratory: Negative for cough and shortness of breath.   Cardiovascular: Negative for chest pain, palpitations and leg swelling.  Gastrointestinal: Negative for abdominal pain, blood in stool, constipation, diarrhea, nausea and vomiting.  Genitourinary: Negative for dysuria.  Musculoskeletal: Negative for falls and myalgias.  Skin: Negative for rash.  Neurological: Negative for dizziness.  Psychiatric/Behavioral: Negative for depression. The patient is not nervous/anxious.       Past  Medical History:  Diagnosis Date  . Allergic dermatitis   . Allergic rhinitis   . Complex tear of medial meniscus of left knee   . Diverticulitis   . Eczema   . GERD (gastroesophageal reflux disease)   . History of chickenpox   . Hyperlipidemia   . Hypertension   . Osteoarthritis    LEFT KNEE  . Seasonal allergies     reports that she has never smoked. She has never used smokeless tobacco. She reports current alcohol use. She reports that she does not use drugs.   Current Outpatient Medications:  .  acetaminophen (TYLENOL) 500 MG tablet, Take 500 mg by mouth every 6 (six) hours as needed., Disp: , Rfl:  .  azelastine (ASTELIN) 0.1 % nasal spray, Use 2 sprays in each nostril 1-2 times daily., Disp: 30 mL, Rfl: 5 .  diphenhydrAMINE (BENADRYL) 25 MG tablet, Take 25 mg by mouth every 6 (six) hours as needed., Disp: , Rfl:  .  hydrochlorothiazide (HYDRODIURIL) 25 MG tablet, Take 0.5 tablets (12.5 mg total) by mouth daily., Disp: 45 tablet, Rfl: 1 .  lisinopril (ZESTRIL) 10 MG tablet, Take 1 tablet (10 mg total) by mouth daily., Disp: 90 tablet, Rfl: 1 .  loratadine (CLARITIN) 10 MG tablet, Take 10 mg by mouth daily. Uses as needed., Disp: , Rfl:  .  oxymetazoline (AFRIN) 0.05 % nasal spray, Place 1 spray into both nostrils 2 (two) times daily as needed for congestion. , Disp: , Rfl:  .  sodium chloride (OCEAN) 0.65 % SOLN nasal spray, Place 1 spray into both nostrils as needed for  congestion., Disp: , Rfl:    Observations/Objective: Blood pressure 122/82, pulse 86, temperature 98.3 F (36.8 C), temperature source Temporal, height 5' 4.6" (1.641 m), weight 182 lb 1.9 oz (82.6 kg), SpO2 95 %.  Physical Exam Constitutional:      General: She is not in acute distress.    Appearance: Normal appearance. She is well-developed. She is not ill-appearing or toxic-appearing.  HENT:     Head: Normocephalic.     Right Ear: Hearing, tympanic membrane, ear canal and external ear normal. Tympanic  membrane is not erythematous, retracted or bulging.     Left Ear: Hearing, tympanic membrane, ear canal and external ear normal. Tympanic membrane is not erythematous, retracted or bulging.     Nose: No mucosal edema or rhinorrhea.     Right Sinus: No maxillary sinus tenderness or frontal sinus tenderness.     Left Sinus: No maxillary sinus tenderness or frontal sinus tenderness.     Mouth/Throat:     Pharynx: Uvula midline.  Eyes:     General: Lids are normal. Lids are everted, no foreign bodies appreciated.     Conjunctiva/sclera: Conjunctivae normal.     Pupils: Pupils are equal, round, and reactive to light.  Neck:     Thyroid: No thyroid mass or thyromegaly.     Vascular: No carotid bruit.     Trachea: Trachea normal.  Cardiovascular:     Rate and Rhythm: Normal rate and regular rhythm.     Pulses: Normal pulses.     Heart sounds: Normal heart sounds, S1 normal and S2 normal. No murmur heard.  No friction rub. No gallop.   Pulmonary:     Effort: Pulmonary effort is normal. No tachypnea or respiratory distress.     Breath sounds: Normal breath sounds. No decreased breath sounds, wheezing, rhonchi or rales.  Abdominal:     General: Bowel sounds are normal.     Palpations: Abdomen is soft.     Tenderness: There is abdominal tenderness in the left lower quadrant. There is no right CVA tenderness, left CVA tenderness, guarding or rebound.  Musculoskeletal:     Cervical back: Normal range of motion and neck supple.  Skin:    General: Skin is warm and dry.     Findings: No rash.  Neurological:     Mental Status: She is alert.  Psychiatric:        Mood and Affect: Mood is not anxious or depressed.        Speech: Speech normal.        Behavior: Behavior normal. Behavior is cooperative.        Thought Content: Thought content normal.        Judgment: Judgment normal.      Assessment and Plan   Acute diverticulitis Early mild... treat with cipro flagyl combo given allergy to  cephalosporins.  Close follow up in 2 days.  return precautions given.     Eliezer Lofts, MD

## 2020-06-04 NOTE — Assessment & Plan Note (Signed)
Early mild... treat with cipro flagyl combo given allergy to cephalosporins.  Close follow up in 2 days.  return precautions given.

## 2020-06-07 ENCOUNTER — Ambulatory Visit (INDEPENDENT_AMBULATORY_CARE_PROVIDER_SITE_OTHER): Payer: Medicare Other | Admitting: Family Medicine

## 2020-06-07 ENCOUNTER — Encounter: Payer: Self-pay | Admitting: Family Medicine

## 2020-06-07 ENCOUNTER — Other Ambulatory Visit: Payer: Self-pay

## 2020-06-07 VITALS — BP 124/78 | HR 88 | Temp 97.3°F | Wt 184.2 lb

## 2020-06-07 DIAGNOSIS — K5900 Constipation, unspecified: Secondary | ICD-10-CM

## 2020-06-07 DIAGNOSIS — K5792 Diverticulitis of intestine, part unspecified, without perforation or abscess without bleeding: Secondary | ICD-10-CM

## 2020-06-07 NOTE — Patient Instructions (Addendum)
Start flagyl.  Continue BID miralax. Add docusate twice daily.  Increase water.  If not BM in 24 hours.Marland Kitchen add milk of magnesia.

## 2020-06-07 NOTE — Progress Notes (Signed)
Chief Complaint  Patient presents with  . Follow-up    no improvement    History of Present Illness: HPI    79 year old female presents for follow up presumed diverticulitis on day 3 of  cipro and flagyl.  She reports she started Cipro BID... no improvement in symptoms.  Still with bloating. No specific pain. Some nausea off and on.  Small BM dry BM.Marland Kitchen this AM.  No fever. No blood in stool.   Using miralax twice daily.  This visit occurred during the SARS-CoV-2 public health emergency.  Safety protocols were in place, including screening questions prior to the visit, additional usage of staff PPE, and extensive cleaning of exam room while observing appropriate contact time as indicated for disinfecting solutions.   COVID 19 screen:  No recent travel or known exposure to COVID19 The patient denies respiratory symptoms of COVID 19 at this time. The importance of social distancing was discussed today.     Review of Systems  Constitutional: Negative for chills and fever.  HENT: Negative for congestion and ear pain.   Eyes: Negative for pain and redness.  Respiratory: Negative for cough and shortness of breath.   Cardiovascular: Negative for chest pain, palpitations and leg swelling.  Gastrointestinal: Positive for constipation. Negative for blood in stool, diarrhea, nausea and vomiting.  Genitourinary: Negative for dysuria.  Musculoskeletal: Negative for falls and myalgias.  Skin: Negative for rash.  Neurological: Negative for dizziness.  Psychiatric/Behavioral: Negative for depression. The patient is not nervous/anxious.       Past Medical History:  Diagnosis Date  . Allergic dermatitis   . Allergic rhinitis   . Complex tear of medial meniscus of left knee   . Diverticulitis   . Eczema   . GERD (gastroesophageal reflux disease)   . History of chickenpox   . Hyperlipidemia   . Hypertension   . Osteoarthritis    LEFT KNEE  . Seasonal allergies     reports that she  has never smoked. She has never used smokeless tobacco. She reports current alcohol use. She reports that she does not use drugs.   Current Outpatient Medications:  .  acetaminophen (TYLENOL) 500 MG tablet, Take 500 mg by mouth every 6 (six) hours as needed., Disp: , Rfl:  .  azelastine (ASTELIN) 0.1 % nasal spray, Use 2 sprays in each nostril 1-2 times daily., Disp: 30 mL, Rfl: 5 .  ciprofloxacin (CIPRO) 750 MG tablet, Take 1 tablet (750 mg total) by mouth 2 (two) times daily., Disp: 20 tablet, Rfl: 0 .  diphenhydrAMINE (BENADRYL) 25 MG tablet, Take 25 mg by mouth every 6 (six) hours as needed., Disp: , Rfl:  .  EPINEPHrine 0.3 mg/0.3 mL IJ SOAJ injection, SMARTSIG:0.3 Milligram(s) IM Once PRN, Disp: , Rfl:  .  hydrochlorothiazide (HYDRODIURIL) 25 MG tablet, Take 0.5 tablets (12.5 mg total) by mouth daily., Disp: 45 tablet, Rfl: 1 .  lisinopril (ZESTRIL) 10 MG tablet, Take 1 tablet (10 mg total) by mouth daily., Disp: 90 tablet, Rfl: 1 .  loratadine (CLARITIN) 10 MG tablet, Take 10 mg by mouth daily. Uses as needed., Disp: , Rfl:  .  metroNIDAZOLE (FLAGYL) 500 MG tablet, Take 1 tablet (500 mg total) by mouth every 6 (six) hours., Disp: 40 tablet, Rfl: 0 .  oxymetazoline (AFRIN) 0.05 % nasal spray, Place 1 spray into both nostrils 2 (two) times daily as needed for congestion. , Disp: , Rfl:  .  sodium chloride (OCEAN) 0.65 % SOLN nasal spray,  Place 1 spray into both nostrils as needed for congestion., Disp: , Rfl:    Observations/Objective: Blood pressure 124/78, pulse 88, temperature (!) 97.3 F (36.3 C), temperature source Temporal, weight 184 lb 4 oz (83.6 kg), SpO2 95 %.  Physical Exam Constitutional:      General: She is not in acute distress.    Appearance: Normal appearance. She is well-developed. She is not ill-appearing or toxic-appearing.  HENT:     Head: Normocephalic.     Right Ear: Hearing, tympanic membrane, ear canal and external ear normal. Tympanic membrane is not  erythematous, retracted or bulging.     Left Ear: Hearing, tympanic membrane, ear canal and external ear normal. Tympanic membrane is not erythematous, retracted or bulging.     Nose: No mucosal edema or rhinorrhea.     Right Sinus: No maxillary sinus tenderness or frontal sinus tenderness.     Left Sinus: No maxillary sinus tenderness or frontal sinus tenderness.     Mouth/Throat:     Pharynx: Uvula midline.  Eyes:     General: Lids are normal. Lids are everted, no foreign bodies appreciated.     Conjunctiva/sclera: Conjunctivae normal.     Pupils: Pupils are equal, round, and reactive to light.  Neck:     Thyroid: No thyroid mass or thyromegaly.     Vascular: No carotid bruit.     Trachea: Trachea normal.  Cardiovascular:     Rate and Rhythm: Normal rate and regular rhythm.     Pulses: Normal pulses.     Heart sounds: Normal heart sounds, S1 normal and S2 normal. No murmur heard.  No friction rub. No gallop.   Pulmonary:     Effort: Pulmonary effort is normal. No tachypnea or respiratory distress.     Breath sounds: Normal breath sounds. No decreased breath sounds, wheezing, rhonchi or rales.  Abdominal:     General: Bowel sounds are normal.     Palpations: Abdomen is soft.     Tenderness: There is no abdominal tenderness.  Musculoskeletal:     Cervical back: Normal range of motion and neck supple.  Skin:    General: Skin is warm and dry.     Findings: No rash.  Neurological:     Mental Status: She is alert.  Psychiatric:        Mood and Affect: Mood is not anxious or depressed.        Speech: Speech normal.        Behavior: Behavior normal. Behavior is cooperative.        Thought Content: Thought content normal.        Judgment: Judgment normal.     Rectal disimpaction attempted.. no blood noted, no mass and no stool in rectal vault Assessment and Plan Possible partially treated diverticulitis vs constipation related pain.  PAin on exam is improved but continue  infrequent BMs.   Pharmacy did not given pt flagyl.. she has only been on cipro. She will start flagyl in addition to cipro.  Add docusate BID to miralax BID for constipation.  If not improving next week follow up wfor liekly abd CT scan to eval further.  ER precautions for colon obstruction given.   Eliezer Lofts, MD

## 2020-06-11 ENCOUNTER — Ambulatory Visit: Payer: Medicare Other | Admitting: Family Medicine

## 2020-06-17 ENCOUNTER — Telehealth: Payer: Self-pay | Admitting: Family Medicine

## 2020-06-17 NOTE — Progress Notes (Signed)
  Chronic Care Management   Outreach Note  06/17/2020 Name: Vanessa Jones MRN: [id number] DOB: 1941-03-16  Referred by: Jinny Sanders, MD Reason for referral : No chief complaint on file.   An unsuccessful telephone outreach was attempted today. The patient was referred to the pharmacist for assistance with care management and care coordination.   Follow Up Plan:   Earney [facility] [employer] Scheduler

## 2020-06-18 ENCOUNTER — Ambulatory Visit (INDEPENDENT_AMBULATORY_CARE_PROVIDER_SITE_OTHER): Payer: Medicare Other | Admitting: Family Medicine

## 2020-06-18 ENCOUNTER — Ambulatory Visit
Admission: RE | Admit: 2020-06-18 | Discharge: 2020-06-18 | Disposition: A | Discharge status: Home / Self Care | Payer: Medicare Other | Source: Ambulatory Visit | Attending: Family Medicine | Admitting: Family Medicine

## 2020-06-18 ENCOUNTER — Encounter: Payer: Self-pay | Admitting: Family Medicine

## 2020-06-18 ENCOUNTER — Other Ambulatory Visit: Payer: Self-pay

## 2020-06-18 VITALS — BP 140/80 | HR 84 | Temp 97.5°F | Ht 64.5 in | Wt 180.5 lb

## 2020-06-18 DIAGNOSIS — K5730 Diverticulosis of large intestine without perforation or abscess without bleeding: Secondary | ICD-10-CM | POA: Diagnosis not present

## 2020-06-18 DIAGNOSIS — K5900 Constipation, unspecified: Secondary | ICD-10-CM | POA: Insufficient documentation

## 2020-06-18 DIAGNOSIS — R109 Unspecified abdominal pain: Secondary | ICD-10-CM | POA: Diagnosis not present

## 2020-06-18 DIAGNOSIS — R1012 Left upper quadrant pain: Secondary | ICD-10-CM | POA: Diagnosis not present

## 2020-06-18 DIAGNOSIS — K760 Fatty (change of) liver, not elsewhere classified: Secondary | ICD-10-CM | POA: Diagnosis not present

## 2020-06-18 LAB — CBC WITH DIFFERENTIAL/PLATELET
Basophils Absolute: 0 10*3/uL (ref 0.0–0.1)
Basophils Relative: 0.4 % (ref 0.0–3.0)
Eosinophils Absolute: 0.3 10*3/uL (ref 0.0–0.7)
Eosinophils Relative: 3.1 % (ref 0.0–5.0)
HCT: 40.6 % (ref 36.0–46.0)
Hemoglobin: 13.9 g/dL (ref 12.0–15.0)
Lymphocytes Relative: 45 % (ref 12.0–46.0)
Lymphs Abs: 3.7 10*3/uL (ref 0.7–4.0)
MCHC: 34.1 g/dL (ref 30.0–36.0)
MCV: 91.5 fl (ref 78.0–100.0)
Monocytes Absolute: 0.5 10*3/uL (ref 0.1–1.0)
Monocytes Relative: 6.7 % (ref 3.0–12.0)
Neutro Abs: 3.6 10*3/uL (ref 1.4–7.7)
Neutrophils Relative %: 44.8 % (ref 43.0–77.0)
Platelets: 422 10*3/uL — ABNORMAL HIGH (ref 150.0–400.0)
RBC: 4.44 Mil/uL (ref 3.87–5.11)
RDW: 14 % (ref 11.5–15.5)
WBC: 8.2 10*3/uL (ref 4.0–10.5)

## 2020-06-18 LAB — COMPREHENSIVE METABOLIC PANEL
ALT: 9 U/L (ref 0–35)
AST: 15 U/L (ref 0–37)
Albumin: 4.4 g/dL (ref 3.5–5.2)
Alkaline Phosphatase: 66 U/L (ref 39–117)
BUN: 11 mg/dL (ref 6–23)
CO2: 30 mEq/L (ref 19–32)
Calcium: 9.8 mg/dL (ref 8.4–10.5)
Chloride: 97 mEq/L (ref 96–112)
Creatinine, Ser: 0.87 mg/dL (ref 0.40–1.20)
GFR: 63.12 mL/min (ref >60.00)
Glucose, Bld: 94 mg/dL (ref 70–99)
Potassium: 4.2 mEq/L (ref 3.5–5.1)
Sodium: 134 mEq/L — ABNORMAL LOW (ref 135–145)
Total Bilirubin: 0.5 mg/dL (ref 0.2–1.2)
Total Protein: 6.7 g/dL (ref 6.0–8.3)

## 2020-06-18 LAB — POCT I-STAT CREATININE: Creatinine, Ser: 0.8 mg/dL (ref 0.44–1.00)

## 2020-06-18 LAB — TSH: TSH: 1.24 u[IU]/mL (ref 0.35–4.50)

## 2020-06-18 MED ORDER — IOHEXOL 300 MG/ML  SOLN
100.0000 mL | Freq: Once | INTRAMUSCULAR | Status: AC | PRN
  Administered 2020-06-18: 100 mL via INTRAVENOUS

## 2020-06-18 NOTE — Assessment & Plan Note (Addendum)
Negative rectal exam, no impaction at last OV.  Eval for new thyroid disease, cbc and CMET. Improved some with docusate but not normal.

## 2020-06-18 NOTE — Patient Instructions (Addendum)
We will call you to set up CT scan to evaluated abdominal pressure and stool change.  Please stop at the lab to have labs drawn.

## 2020-06-18 NOTE — Progress Notes (Signed)
Chief Complaint  Patient presents with  . Follow-up    Abdominal Pain    History of Present Illness: HPI  79 year old female presents for follow up abdominal pain Treated in early July for presumed diverticulitis with Cipro except 2 doses ( stopped given causing joint pain) /flagyl ( took only 4-5 days worth).. no improvement in symptoms. Continued pain and constipation... seen on 06/07/20 with docusate and miralax BID.   She continues to have stool abnormalities.Marland Kitchen she has no formed stool.. normal consistency but thick peanut butter like consistency.  No pain with BM. No redness.  She continues feeling bloating continuously.  LLQ is better but still not feeling "right"  She feels jittery and weak after BM.   She is eating regular diet. Has cut out ice cream, milk.  Wt Readings from Last 3 Encounters:  06/18/20 180 lb 8 oz (81.9 kg)  06/07/20 184 lb 4 oz (83.6 kg)  06/04/20 182 lb 1.9 oz (82.6 kg)   She is s/p cholecystectomy.. has ovaries.  Last colonoscopy:  2018   This visit occurred during the SARS-CoV-2 public health emergency.  Safety protocols were in place, including screening questions prior to the visit, additional usage of staff PPE, and extensive cleaning of exam room while observing appropriate contact time as indicated for disinfecting solutions.   COVID 19 screen:  No recent travel or known exposure to COVID19 The patient denies respiratory symptoms of COVID 19 at this time. The importance of social distancing was discussed today.     Review of Systems  Constitutional: Negative for chills and fever.  HENT: Negative for congestion and ear pain.   Eyes: Negative for pain and redness.  Respiratory: Negative for cough and shortness of breath.   Cardiovascular: Negative for chest pain, palpitations and leg swelling.  Gastrointestinal: Positive for abdominal pain and heartburn. Negative for blood in stool, constipation, diarrhea, nausea and vomiting.   Genitourinary: Negative for dysuria.  Musculoskeletal: Negative for falls and myalgias.  Skin: Negative for rash.  Neurological: Negative for dizziness.  Psychiatric/Behavioral: Negative for depression. The patient is not nervous/anxious.       Past Medical History:  Diagnosis Date  . Allergic dermatitis   . Allergic rhinitis   . Complex tear of medial meniscus of left knee   . Diverticulitis   . Eczema   . GERD (gastroesophageal reflux disease)   . History of chickenpox   . Hyperlipidemia   . Hypertension   . Osteoarthritis    LEFT KNEE  . Seasonal allergies     reports that she has never smoked. She has never used smokeless tobacco. She reports current alcohol use. She reports that she does not use drugs.   Current Outpatient Medications:  .  acetaminophen (TYLENOL) 500 MG tablet, Take 500 mg by mouth every 6 (six) hours as needed., Disp: , Rfl:  .  azelastine (ASTELIN) 0.1 % nasal spray, Use 2 sprays in each nostril 1-2 times daily., Disp: 30 mL, Rfl: 5 .  diphenhydrAMINE (BENADRYL) 25 MG tablet, Take 25 mg by mouth every 6 (six) hours as needed., Disp: , Rfl:  .  docusate sodium (COLACE) 100 MG capsule, Take 100 mg by mouth daily. , Disp: , Rfl:  .  EPINEPHrine 0.3 mg/0.3 mL IJ SOAJ injection, SMARTSIG:0.3 Milligram(s) IM Once PRN, Disp: , Rfl:  .  hydrochlorothiazide (HYDRODIURIL) 25 MG tablet, Take 0.5 tablets (12.5 mg total) by mouth daily., Disp: 45 tablet, Rfl: 1 .  lisinopril (ZESTRIL) 10 MG  tablet, Take 1 tablet (10 mg total) by mouth daily., Disp: 90 tablet, Rfl: 1 .  loratadine (CLARITIN) 10 MG tablet, Take 10 mg by mouth daily. Uses as needed., Disp: , Rfl:  .  oxymetazoline (AFRIN) 0.05 % nasal spray, Place 1 spray into both nostrils 2 (two) times daily as needed for congestion. , Disp: , Rfl:  .  sodium chloride (OCEAN) 0.65 % SOLN nasal spray, Place 1 spray into both nostrils as needed for congestion., Disp: , Rfl:    Observations/Objective: Blood pressure  140/80, pulse 84, temperature (!) 97.5 F (36.4 C), temperature source Temporal, height 5' 4.5" (1.638 m), weight 180 lb 8 oz (81.9 kg), SpO2 97 %.  Physical Exam Constitutional:      General: She is not in acute distress.    Appearance: Normal appearance. She is well-developed. She is obese. She is not ill-appearing or toxic-appearing.  HENT:     Head: Normocephalic.     Right Ear: Hearing, tympanic membrane, ear canal and external ear normal. Tympanic membrane is not erythematous, retracted or bulging.     Left Ear: Hearing, tympanic membrane, ear canal and external ear normal. Tympanic membrane is not erythematous, retracted or bulging.     Nose: No mucosal edema or rhinorrhea.     Right Sinus: No maxillary sinus tenderness or frontal sinus tenderness.     Left Sinus: No maxillary sinus tenderness or frontal sinus tenderness.     Mouth/Throat:     Pharynx: Uvula midline.  Eyes:     General: Lids are normal. Lids are everted, no foreign bodies appreciated.     Conjunctiva/sclera: Conjunctivae normal.     Pupils: Pupils are equal, round, and reactive to light.  Neck:     Thyroid: No thyroid mass or thyromegaly.     Vascular: No carotid bruit.     Trachea: Trachea normal.  Cardiovascular:     Rate and Rhythm: Normal rate and regular rhythm.     Pulses: Normal pulses.     Heart sounds: Normal heart sounds, S1 normal and S2 normal. No murmur heard.  No friction rub. No gallop.   Pulmonary:     Effort: Pulmonary effort is normal. No tachypnea or respiratory distress.     Breath sounds: Normal breath sounds. No decreased breath sounds, wheezing, rhonchi or rales.  Abdominal:     General: Bowel sounds are normal.     Palpations: Abdomen is soft.     Tenderness: There is generalized abdominal tenderness. There is no right CVA tenderness, left CVA tenderness, guarding or rebound.  Musculoskeletal:     Cervical back: Normal range of motion and neck supple.  Skin:    General: Skin is  warm and dry.     Findings: No rash.  Neurological:     Mental Status: She is alert.  Psychiatric:        Mood and Affect: Mood is not anxious or depressed.        Speech: Speech normal.        Behavior: Behavior normal. Behavior is cooperative.        Thought Content: Thought content normal.        Judgment: Judgment normal.      Assessment and Plan    Abdominal pressure  Elderly female with new stool changes, following initial abd pain.    ? Partially treated diverticulitis .Marland Kitchen was not able to tolerate cipro and only took 1/2 of flagyl given pharmacy issue. Concern for partial GI obstruction  or mass vs ovarian carcinoma.   Eval with CT abd pelvis.  May need GI referral if unremarkable  Constipation Negative rectal exam, no impaction at last OV.  Eval for new thyroid disease, cbc and CMET. Improved some with docusate but not normal.    Eliezer Lofts, MD

## 2020-06-18 NOTE — Assessment & Plan Note (Addendum)
Elderly female with new stool changes, following initial abd pain.    ? Partially treated diverticulitis .Marland Kitchen was not able to tolerate cipro and only took 1/2 of flagyl given pharmacy issue. Concern for partial GI obstruction or mass vs ovarian carcinoma.   Eval with CT abd pelvis.  May need GI referral if unremarkable

## 2020-06-19 ENCOUNTER — Ambulatory Visit: Admission: RE | Admit: 2020-06-19 | Payer: Medicare Other | Source: Ambulatory Visit

## 2020-07-12 DIAGNOSIS — H903 Sensorineural hearing loss, bilateral: Secondary | ICD-10-CM | POA: Diagnosis not present

## 2020-08-28 ENCOUNTER — Telehealth: Payer: Medicare Other

## 2020-11-25 ENCOUNTER — Other Ambulatory Visit: Payer: Self-pay | Admitting: Family Medicine

## 2020-11-25 NOTE — Telephone Encounter (Signed)
Left voice massage to call the office 

## 2020-11-25 NOTE — Telephone Encounter (Signed)
Please schedule MWV with nurse and CPE with Dr. Bedsole. 

## 2021-01-06 ENCOUNTER — Telehealth: Payer: Self-pay | Admitting: Family Medicine

## 2021-01-06 DIAGNOSIS — E7800 Pure hypercholesterolemia, unspecified: Secondary | ICD-10-CM

## 2021-01-06 DIAGNOSIS — E559 Vitamin D deficiency, unspecified: Secondary | ICD-10-CM

## 2021-01-06 DIAGNOSIS — R7303 Prediabetes: Secondary | ICD-10-CM

## 2021-01-06 NOTE — Telephone Encounter (Signed)
-----   Message from Tamera L Johnson, RT sent at 12/31/2020 11:11 AM EST ----- Regarding: Lab Orders for Tuesday 2.15.2022 Please place lab orders for Tuesday 2.15.2022, office visit for physical on Tuesday 2.22.2022 Thank you, Tamera Johnson RT(R)       

## 2021-01-14 ENCOUNTER — Other Ambulatory Visit (INDEPENDENT_AMBULATORY_CARE_PROVIDER_SITE_OTHER): Payer: Medicare Other

## 2021-01-14 ENCOUNTER — Other Ambulatory Visit: Payer: Self-pay

## 2021-01-14 DIAGNOSIS — R7303 Prediabetes: Secondary | ICD-10-CM

## 2021-01-14 DIAGNOSIS — E559 Vitamin D deficiency, unspecified: Secondary | ICD-10-CM

## 2021-01-14 DIAGNOSIS — E7800 Pure hypercholesterolemia, unspecified: Secondary | ICD-10-CM | POA: Diagnosis not present

## 2021-01-14 LAB — LIPID PANEL
Cholesterol: 212 mg/dL — ABNORMAL HIGH (ref 0–200)
HDL: 61.2 mg/dL (ref >39.00)
LDL Cholesterol: 132 mg/dL — ABNORMAL HIGH (ref 0–99)
NonHDL: 151.13
Total CHOL/HDL Ratio: 3
Triglycerides: 96 mg/dL (ref 0.0–149.0)
VLDL: 19.2 mg/dL (ref 0.0–40.0)

## 2021-01-14 LAB — COMPREHENSIVE METABOLIC PANEL
ALT: 8 U/L (ref 0–35)
AST: 15 U/L (ref 0–37)
Albumin: 4.2 g/dL (ref 3.5–5.2)
Alkaline Phosphatase: 73 U/L (ref 39–117)
BUN: 15 mg/dL (ref 6–23)
CO2: 29 mEq/L (ref 19–32)
Calcium: 9.6 mg/dL (ref 8.4–10.5)
Chloride: 99 mEq/L (ref 96–112)
Creatinine, Ser: 0.91 mg/dL (ref 0.40–1.20)
GFR: 60.79 mL/min (ref >60.00)
Glucose, Bld: 91 mg/dL (ref 70–99)
Potassium: 3.9 mEq/L (ref 3.5–5.1)
Sodium: 135 mEq/L (ref 135–145)
Total Bilirubin: 0.6 mg/dL (ref 0.2–1.2)
Total Protein: 7.2 g/dL (ref 6.0–8.3)

## 2021-01-14 LAB — HEMOGLOBIN A1C: Hgb A1c MFr Bld: 6 % (ref 4.6–6.5)

## 2021-01-14 LAB — VITAMIN D 25 HYDROXY (VIT D DEFICIENCY, FRACTURES): VITD: 43.12 ng/mL (ref 30.00–100.00)

## 2021-01-14 NOTE — Progress Notes (Signed)
No critical labs need to be addressed urgently. We will discuss labs in detail at upcoming office visit.   

## 2021-01-15 DIAGNOSIS — Z1231 Encounter for screening mammogram for malignant neoplasm of breast: Secondary | ICD-10-CM | POA: Diagnosis not present

## 2021-01-15 LAB — HM MAMMOGRAPHY

## 2021-01-16 DIAGNOSIS — Z86010 Personal history of colonic polyps: Secondary | ICD-10-CM | POA: Diagnosis not present

## 2021-01-16 DIAGNOSIS — K5900 Constipation, unspecified: Secondary | ICD-10-CM | POA: Diagnosis not present

## 2021-01-17 ENCOUNTER — Encounter: Payer: Self-pay | Admitting: Family Medicine

## 2021-01-20 ENCOUNTER — Other Ambulatory Visit: Payer: Self-pay

## 2021-01-21 ENCOUNTER — Encounter: Payer: Self-pay | Admitting: Family Medicine

## 2021-01-21 ENCOUNTER — Ambulatory Visit (INDEPENDENT_AMBULATORY_CARE_PROVIDER_SITE_OTHER): Payer: Medicare Other | Admitting: Family Medicine

## 2021-01-21 ENCOUNTER — Encounter: Payer: Medicare Other | Admitting: Family Medicine

## 2021-01-21 VITALS — BP 124/80 | HR 85 | Temp 97.3°F | Ht 65.0 in | Wt 183.0 lb

## 2021-01-21 DIAGNOSIS — Z0000 Encounter for general adult medical examination without abnormal findings: Secondary | ICD-10-CM

## 2021-01-21 DIAGNOSIS — R7303 Prediabetes: Secondary | ICD-10-CM

## 2021-01-21 DIAGNOSIS — E559 Vitamin D deficiency, unspecified: Secondary | ICD-10-CM

## 2021-01-21 DIAGNOSIS — Z23 Encounter for immunization: Secondary | ICD-10-CM

## 2021-01-21 DIAGNOSIS — E7800 Pure hypercholesterolemia, unspecified: Secondary | ICD-10-CM | POA: Diagnosis not present

## 2021-01-21 DIAGNOSIS — R0600 Dyspnea, unspecified: Secondary | ICD-10-CM

## 2021-01-21 DIAGNOSIS — I10 Essential (primary) hypertension: Secondary | ICD-10-CM

## 2021-01-21 DIAGNOSIS — M8589 Other specified disorders of bone density and structure, multiple sites: Secondary | ICD-10-CM | POA: Diagnosis not present

## 2021-01-21 DIAGNOSIS — R0609 Other forms of dyspnea: Secondary | ICD-10-CM

## 2021-01-21 LAB — CBC WITH DIFFERENTIAL/PLATELET
Basophils Absolute: 0.1 10*3/uL (ref 0.0–0.1)
Basophils Relative: 0.5 % (ref 0.0–3.0)
Eosinophils Absolute: 0.5 10*3/uL (ref 0.0–0.7)
Eosinophils Relative: 4.7 % (ref 0.0–5.0)
HCT: 41 % (ref 36.0–46.0)
Hemoglobin: 13.8 g/dL (ref 12.0–15.0)
Lymphocytes Relative: 35.5 % (ref 12.0–46.0)
Lymphs Abs: 3.5 10*3/uL (ref 0.7–4.0)
MCHC: 33.7 g/dL (ref 30.0–36.0)
MCV: 91.9 fl (ref 78.0–100.0)
Monocytes Absolute: 0.6 10*3/uL (ref 0.1–1.0)
Monocytes Relative: 6.5 % (ref 3.0–12.0)
Neutro Abs: 5.2 10*3/uL (ref 1.4–7.7)
Neutrophils Relative %: 52.8 % (ref 43.0–77.0)
Platelets: 365 10*3/uL (ref 150.0–400.0)
RBC: 4.46 Mil/uL (ref 3.87–5.11)
RDW: 14.2 % (ref 11.5–15.5)
WBC: 9.9 10*3/uL (ref 4.0–10.5)

## 2021-01-21 LAB — TSH: TSH: 1.44 u[IU]/mL (ref 0.35–4.50)

## 2021-01-21 LAB — BRAIN NATRIURETIC PEPTIDE: Pro B Natriuretic peptide (BNP): 37 pg/mL (ref 0.0–100.0)

## 2021-01-21 MED ORDER — ATORVASTATIN CALCIUM 10 MG PO TABS: 10.0000 mg | ORAL_TABLET | Freq: Every day | ORAL | 3 refills | Status: DC

## 2021-01-21 NOTE — Progress Notes (Signed)
Patient ID: Vanessa Jones, female    DOB: Aug 04, 1941, 80 y.o.   MRN: [id number]  This visit was conducted in person.  BP 124/80 (BP Location: Left Arm, Patient Position: Sitting, Cuff Size: Normal)   Pulse 85   Temp (!) 97.3 F (36.3 C) (Temporal)   Ht 5\' 5"  (1.651 m)   Wt 183 lb (83 kg)   SpO2 97%   BMI 30.45 kg/m    CC:  Chief Complaint  Patient presents with  . Medicare Wellness    Subjective:   HPI: Vanessa Jones is a 80 y.o. female presenting on 01/21/2021 for Medicare Wellness I have personally reviewed the Medicare Annual Wellness questionnaire and have noted 1. The patient's medical and social history 2. Their use of alcohol, tobacco or illicit drugs 3. Their current medications and supplements 4. The patient's functional ability including ADL's, fall risks, home safety risks and hearing or visual             impairment. 5. Diet and physical activities 6. Evidence for depression or mood disorders 7.         Updated provider list Cognitive evaluation was performed and recorded on pt medicare questionnaire form. The patients weight, height, BMI and visual acuity have been recorded in the chart  I have made referrals, counseling and provided education to the patient based review of the above and I have provided the pt with a written personalized care plan for preventive services.   Documentation of this information was scanned into the electronic record under the media tab.  Fall Risk  01/21/2021 10/06/2019 07/01/2018 05/10/2018 02/02/2017  Falls in the past year? 0 0 No No No  Comment - - Emmi Telephone Survey: data to providers prior to load - -  Number falls in past yr: 0 0 - - -  Injury with Fall? 0 0 - - -  Risk for fall due to : No Fall Risks Medication side effect - - -  Follow up Falls evaluation completed Falls evaluation completed;Falls prevention discussed - - -     Hearing Screening   125Hz  250Hz  500Hz  1000Hz  2000Hz  3000Hz  4000Hz  6000Hz  8000Hz    Right ear:           Left ear:           Comments: Patient wears hearing aids   Visual Acuity Screening   Right eye Left eye Both eyes  Without correction: 20/20 20/20 20/20   With correction:       [place] Visit from 01/21/2021 in [place] at [facility] Total Score 1       Advance directives and end of life planning reviewed in detail with patient and documented in EMR. Patient given handout on advance care directives if needed. HCPOA and living will updated if needed.  Hypertension:    At goal on HCTZ and lisinopril BP Readings from Last 3 Encounters:  01/21/21 124/80  06/18/20 140/80  06/07/20 124/78  Using medication without problems or lightheadedness: nne Chest pain with exertion: none.. does note some pain in middle upper back with exertion, improves with rest. Edema: none Short of breath: more  SOB than usual, gradaully worsening.. with exertion.Marland Kitchen off and on notes it. Some trouble doing house chores and going up stairs... improved with rest. Average home BPs: Other issues:  prediabetes  Improving. Lab Results  Component Value Date   HGBA1C 6.0 01/14/2021     Elevated Cholesterol: Worsened control off statin.  Had mild SE but she wanted to stop it. Lab Results  Component Value Date   CHOL 212 (H) 01/14/2021   HDL 61.20 01/14/2021   LDLCALC 132 (H) 01/14/2021   TRIG 96.0 01/14/2021   CHOLHDL 3 01/14/2021  Using medications without problems: Muscle aches: none Diet compliance: good Exercise:NONE Other complaints:  The 10-year ASCVD risk score Vanessa Jones DC Brooke Bonito., et al., 2013) is: 24.5%   Values used to calculate the score:     Age: 9 years     Sex: Female     Is Non-Hispanic African American: No     Diabetic: No     Tobacco smoker: No     Systolic Blood Pressure: 578 mmHg     Is BP treated: Yes     HDL Cholesterol: 61.2 mg/dL     Total Cholesterol: 212 mg/dL    Vit D def resolved on supplement.  Relevant past medical,  surgical, family and social history reviewed and updated as indicated. Interim medical history since our last visit reviewed. Allergies and medications reviewed and updated. Outpatient Medications Prior to Visit  Medication Sig Dispense Refill  . acetaminophen (TYLENOL) 500 MG tablet Take 500 mg by mouth every 6 (six) hours as needed.    Marland Kitchen azelastine (ASTELIN) 0.1 % nasal spray Use 2 sprays in each nostril 1-2 times daily. 30 mL 5  . diphenhydrAMINE (BENADRYL) 25 MG tablet Take 25 mg by mouth every 6 (six) hours as needed.    Marland Kitchen EPINEPHrine 0.3 mg/0.3 mL IJ SOAJ injection SMARTSIG:0.3 Milligram(s) IM Once PRN    . hydrochlorothiazide (HYDRODIURIL) 25 MG tablet TAKE 1/2 TABLET(12.5 MG) BY MOUTH DAILY 45 tablet 0  . lisinopril (ZESTRIL) 10 MG tablet TAKE 1 TABLET(10 MG) BY MOUTH DAILY 90 tablet 0  . loratadine (CLARITIN) 10 MG tablet Take 10 mg by mouth daily. Uses as needed.    Marland Kitchen oxymetazoline (AFRIN) 0.05 % nasal spray Place 1 spray into both nostrils 2 (two) times daily as needed for congestion.     . sodium chloride (OCEAN) 0.65 % SOLN nasal spray Place 1 spray into both nostrils as needed for congestion.    . docusate sodium (COLACE) 100 MG capsule Take 100 mg by mouth daily.      No facility-administered medications prior to visit.     Per HPI unless specifically indicated in ROS section below Review of Systems  Constitutional: Negative for fatigue and fever.  HENT: Negative for congestion.   Eyes: Negative for pain.  Respiratory: Positive for shortness of breath. Negative for cough.   Cardiovascular: Negative for chest pain, palpitations and leg swelling.  Gastrointestinal: Negative for abdominal pain.  Genitourinary: Negative for dysuria and vaginal bleeding.  Musculoskeletal: Negative for back pain.  Neurological: Negative for syncope, light-headedness and headaches.  Psychiatric/Behavioral: Negative for dysphoric mood.   Objective:  BP 124/80 (BP Location: Left Arm, Patient  Position: Sitting, Cuff Size: Normal)   Pulse 85   Temp (!) 97.3 F (36.3 C) (Temporal)   Ht 5\' 5"  (1.651 m)   Wt 183 lb (83 kg)   SpO2 97%   BMI 30.45 kg/m   Wt Readings from Last 3 Encounters:  01/21/21 183 lb (83 kg)  06/18/20 180 lb 8 oz (81.9 kg)  06/07/20 184 lb 4 oz (83.6 kg)      Physical Exam Constitutional:      General: She is not in acute distress.Vital signs are normal.     Appearance: Normal appearance. She is well-developed and  well-nourished. She is not ill-appearing or toxic-appearing.  HENT:     Head: Normocephalic.     Right Ear: Hearing, tympanic membrane, ear canal and external ear normal.     Left Ear: Hearing, tympanic membrane, ear canal and external ear normal.     Nose: Nose normal.  Eyes:     General: Lids are normal. Lids are everted, no foreign bodies appreciated.     Extraocular Movements: EOM normal.     Conjunctiva/sclera: Conjunctivae normal.     Pupils: Pupils are equal, round, and reactive to light.  Neck:     Thyroid: No thyroid mass or thyromegaly.     Vascular: No carotid bruit.     Trachea: Trachea normal.  Cardiovascular:     Rate and Rhythm: Normal rate and regular rhythm.     Pulses: Intact distal pulses.     Heart sounds: Normal heart sounds, S1 normal and S2 normal. No murmur heard. No gallop.   Pulmonary:     Effort: Pulmonary effort is normal. No respiratory distress.     Breath sounds: Normal breath sounds. No wheezing, rhonchi or rales.  Abdominal:     General: Bowel sounds are normal. There is no distension or abdominal bruit.     Palpations: Abdomen is soft. There is no fluid wave, hepatosplenomegaly or mass.     Tenderness: There is no abdominal tenderness. There is no CVA tenderness, guarding or rebound.     Hernia: No hernia is present.  Musculoskeletal:     Cervical back: Normal range of motion and neck supple.  Lymphadenopathy:     Cervical: No cervical adenopathy.     Upper Body:  No axillary adenopathy  present. Skin:    General: Skin is warm, dry and intact.     Findings: No rash.  Neurological:     Mental Status: She is alert.     Cranial Nerves: No cranial nerve deficit.     Sensory: No sensory deficit.     Deep Tendon Reflexes: Strength normal.  Psychiatric:        Mood and Affect: Mood is not anxious or depressed.        Speech: Speech normal.        Behavior: Behavior normal. Behavior is cooperative.        Cognition and Memory: Cognition and memory normal.        Judgment: Judgment normal.       Results for orders placed or performed in visit on 01/17/21  HM MAMMOGRAPHY  Result Value Ref Range   HM Mammogram 0-4 Bi-Rad 0-4 Bi-Rad, Self Reported Normal    This visit occurred during the SARS-CoV-2 public health emergency.  Safety protocols were in place, including screening questions prior to the visit, additional usage of staff PPE, and extensive cleaning of exam room while observing appropriate contact time as indicated for disinfecting solutions.   COVID 19 screen:  No recent travel or known exposure to COVID19 The patient denies respiratory symptoms of COVID 19 at this time. The importance of social distancing was discussed today.   Assessment and Plan   The patient's preventative maintenance and recommended screening tests for an annual wellness exam were reviewed in full today. Brought up to date unless services declined.  Counselled on the importance of diet, exercise, and its role in overall health and mortality. The patient's FH and SH was reviewed, including their home life, tobacco status, and drug and alcohol status.   Colon:9/2018DrGustavo Lah, [facility], repeat in  3 years. Family history of colon cancer in mother .  Scheduled for 02/2021 PAP/DVE: PAP not indicated due to age. Has decidednoDVE, no symptoms, no family history of uterine and ovarian cancer.  DEXA:2017 normal improved, recheck in 2023 Mammogram: Last2022,sister with breast cancer. Repeat  yearly. Vaccines: Td and PNA, shingles vaccine uptodate.   Given flu shot today, S/P COVID x 3   Problem List Items Addressed This Visit    DOE (dyspnea on exertion)    Cardiac and pulmonary exam normal... concerning for anginal equivalent but she refuses EKG today... she will let me know if it is worsening. Eval with cbc, TSH and BNP for other causes.      Relevant Orders   CBC with Differential/Platelet   TSH   Pro b natriuretic peptide   Essential hypertension, benign (Chronic)    Stable, chronic.  Continue current medication.   HCTZ 25 mg daily.      Relevant Medications   atorvastatin (LIPITOR) 10 MG tablet   Osteopenia (Chronic)   Prediabetes   Pure hypercholesterolemia (Chronic)     She has not been on statin for 2 years... she was not sure it was helping, but on labs it looks to me it was... will restart and check chol in 3 months to show her effectiveness of atorvastatin 10 mg daily.      Relevant Medications   atorvastatin (LIPITOR) 10 MG tablet   Other Relevant Orders   Lipid panel   Vitamin D deficiency    Other Visit Diagnoses    Medicare annual wellness visit, subsequent    -  Primary   Need for immunization against influenza       Relevant Orders   Flu Vaccine QUAD High Dose(Fluad) (Completed)      Eliezer Lofts, MD

## 2021-01-21 NOTE — Addendum Note (Signed)
Addended by: Ellamae Sia on: 01/21/2021 11:52 AM   Modules accepted: Orders

## 2021-01-21 NOTE — Patient Instructions (Addendum)
Trial of restart of statin to see if effective. Return in 3 months for labs only check for cholesterol back on statin. Do mammogram along with bone density next year. Please stop at the lab to have labs drawn.  Please call if shortness of breath  With exertion worsening.

## 2021-01-21 NOTE — Assessment & Plan Note (Signed)
She has not been on statin for 2 years... she was not sure it was helping, but on labs it looks to me it was... will restart and check chol in 3 months to show her effectiveness of atorvastatin 10 mg daily.

## 2021-01-21 NOTE — Assessment & Plan Note (Signed)
Stable, chronic.  Continue current medication.  HCTZ 25 mg daily 

## 2021-01-21 NOTE — Assessment & Plan Note (Signed)
Cardiac and pulmonary exam normal... concerning for anginal equivalent but she refuses EKG today... she will let me know if it is worsening. Eval with cbc, TSH and BNP for other causes.

## 2021-03-06 ENCOUNTER — Other Ambulatory Visit: Payer: Self-pay

## 2021-03-06 ENCOUNTER — Other Ambulatory Visit
Admission: RE | Admit: 2021-03-06 | Discharge: 2021-03-06 | Disposition: A | Discharge status: Home / Self Care | Payer: Medicare Other | Source: Ambulatory Visit | Attending: Gastroenterology | Admitting: Gastroenterology

## 2021-03-06 DIAGNOSIS — Z01812 Encounter for preprocedural laboratory examination: Secondary | ICD-10-CM | POA: Diagnosis not present

## 2021-03-06 DIAGNOSIS — Z20822 Contact with and (suspected) exposure to covid-19: Secondary | ICD-10-CM | POA: Insufficient documentation

## 2021-03-06 LAB — SARS CORONAVIRUS 2 (TAT 6-24 HRS): SARS Coronavirus 2: NEGATIVE

## 2021-03-10 ENCOUNTER — Ambulatory Visit: Payer: Medicare Other | Admitting: Certified Registered"

## 2021-03-10 ENCOUNTER — Ambulatory Visit
Admission: RE | Admit: 2021-03-10 | Discharge: 2021-03-10 | Disposition: A | Discharge status: Home / Self Care | Payer: Medicare Other | Attending: Gastroenterology | Admitting: Gastroenterology

## 2021-03-10 ENCOUNTER — Encounter: Payer: Self-pay | Admitting: *Deleted

## 2021-03-10 ENCOUNTER — Other Ambulatory Visit: Payer: Self-pay

## 2021-03-10 ENCOUNTER — Encounter
Admission: RE | Disposition: A | Discharge status: Home / Self Care | Payer: Self-pay | Source: Home / Self Care | Attending: Gastroenterology

## 2021-03-10 DIAGNOSIS — Z7982 Long term (current) use of aspirin: Secondary | ICD-10-CM | POA: Diagnosis not present

## 2021-03-10 DIAGNOSIS — Z79899 Other long term (current) drug therapy: Secondary | ICD-10-CM | POA: Insufficient documentation

## 2021-03-10 DIAGNOSIS — K5790 Diverticulosis of intestine, part unspecified, without perforation or abscess without bleeding: Secondary | ICD-10-CM | POA: Diagnosis not present

## 2021-03-10 DIAGNOSIS — K640 First degree hemorrhoids: Secondary | ICD-10-CM | POA: Insufficient documentation

## 2021-03-10 DIAGNOSIS — Z1211 Encounter for screening for malignant neoplasm of colon: Secondary | ICD-10-CM | POA: Diagnosis not present

## 2021-03-10 DIAGNOSIS — Z09 Encounter for follow-up examination after completed treatment for conditions other than malignant neoplasm: Secondary | ICD-10-CM | POA: Diagnosis present

## 2021-03-10 DIAGNOSIS — K635 Polyp of colon: Secondary | ICD-10-CM | POA: Insufficient documentation

## 2021-03-10 DIAGNOSIS — K5730 Diverticulosis of large intestine without perforation or abscess without bleeding: Secondary | ICD-10-CM | POA: Insufficient documentation

## 2021-03-10 DIAGNOSIS — Z86010 Personal history of colonic polyps: Secondary | ICD-10-CM | POA: Diagnosis not present

## 2021-03-10 HISTORY — DX: Benign neoplasm of colon, unspecified: D12.6

## 2021-03-10 HISTORY — PX: COLONOSCOPY WITH PROPOFOL: SHX5780

## 2021-03-10 SURGERY — COLONOSCOPY WITH PROPOFOL
Anesthesia: General

## 2021-03-10 MED ORDER — PROPOFOL 10 MG/ML IV BOLUS
INTRAVENOUS | Status: DC | PRN
  Administered 2021-03-10: 20 mg via INTRAVENOUS
  Administered 2021-03-10: 70 mg via INTRAVENOUS

## 2021-03-10 MED ORDER — LIDOCAINE HCL (CARDIAC) PF 100 MG/5ML IV SOSY
PREFILLED_SYRINGE | INTRAVENOUS | Status: DC | PRN
  Administered 2021-03-10: 50 mg via INTRAVENOUS

## 2021-03-10 MED ORDER — PROPOFOL 500 MG/50ML IV EMUL
INTRAVENOUS | Status: DC | PRN
  Administered 2021-03-10: 125 ug/kg/min via INTRAVENOUS

## 2021-03-10 MED ORDER — SODIUM CHLORIDE 0.9 % IV SOLN: INTRAVENOUS | Status: DC

## 2021-03-10 MED ORDER — PROPOFOL 500 MG/50ML IV EMUL
INTRAVENOUS | Status: AC
  Filled 2021-03-10: qty 50

## 2021-03-10 NOTE — Anesthesia Preprocedure Evaluation (Signed)
Anesthesia Evaluation  Patient identified by MRN, date of birth, ID band Patient awake    Reviewed: Allergy & Precautions, H&P , NPO status , Patient's Chart, lab work & pertinent test results  History of Anesthesia Complications Negative for: history of anesthetic complications  Airway Mallampati: III  TM Distance: >3 FB Neck ROM: full    Dental no notable dental hx. (+) Teeth Intact   Pulmonary neg pulmonary ROS,    Pulmonary exam normal breath sounds clear to auscultation       Cardiovascular Exercise Tolerance: Good hypertension, Pt. on medications (-) Past MI Normal cardiovascular exam Rhythm:regular Rate:Normal     Neuro/Psych negative neurological ROS  negative psych ROS   GI/Hepatic Neg liver ROS, GERD  Medicated,  Endo/Other  negative endocrine ROS  Renal/GU negative Renal ROS  negative genitourinary   Musculoskeletal  (+) Arthritis ,   Abdominal   Peds negative pediatric ROS (+)  Hematology negative hematology ROS (+)   Anesthesia Other Findings Past Medical History:   Hypertension                                                 Hyperlipidemia                                               Acute back pain                                              Osteoarthritis                                               Allergic dermatitis                                          Allergic rhinitis                                            Diverticulitis                                               GERD (gastroesophageal reflux disease)                       Reproductive/Obstetrics negative OB ROS                             Anesthesia Physical  Anesthesia Plan  ASA: II  Anesthesia Plan: General   Post-op Pain Management:    Induction: Intravenous  PONV Risk Score and Plan: 3 and Ondansetron, Propofol infusion and TIVA  Airway Management Planned: Nasal  Cannula  Additional Equipment: None  Intra-op Plan:   Post-operative Plan:   Informed Consent: I have reviewed the patients History and Physical, chart, labs and discussed the procedure including the risks, benefits and alternatives for the proposed anesthesia with the patient or authorized representative who has indicated his/her understanding and acceptance.     Dental advisory given  Plan Discussed with: Anesthesiologist, CRNA and Surgeon  Anesthesia Plan Comments: (Discussed risks of anesthesia with patient, including possibility of difficulty with spontaneous ventilation under anesthesia necessitating airway intervention, PONV, and rare risks such as cardiac or respiratory or neurological events. Patient understands.)        Anesthesia Quick Evaluation

## 2021-03-10 NOTE — Progress Notes (Signed)
Patient complains of 'uncomfortable gas pain'.  Abdomen is soft, non-distended.  Patient denies nausea or actual pain in her abdomen, but states "I am not able to pass the gas right now".  Dr Haig Prophet in to assess the patient, notes soft abdomen and encourages patient to lie on her left side and move around as tolerated.  Patient up to the bathroom, voided but did not pass gas.  She does state she is ready to be discharged to home, will keep moving around which she states does improve the discomfort.  Encouraged the patient to notify Dr Drinda Butts office or the ENDO unit if pain increases or does not subside this afternoon.  Verbalized understanding.

## 2021-03-10 NOTE — Op Note (Signed)
[facility] Gastroenterology Patient Name: Vanessa Jones Procedure Date: 03/10/2021 9:57 AM MRN: [id number] Account #: [id number] Date of Birth: Sep 01, 1941 Admit Type: Outpatient Age: 80 Room: [facility] ENDO ROOM 3 Gender: Female Note Status: Finalized Procedure:             Colonoscopy Indications:           High risk colon cancer surveillance: Personal history                         of multiple (3 or more) adenomas Providers:             Andrey Farmer MD, MD Referring MD:          Jinny Sanders MD, MD (Referring MD) Medicines:             Monitored Anesthesia Care Complications:         No immediate complications. Estimated blood loss:                         Minimal. Procedure:             Pre-Anesthesia Assessment:                        - Prior to the procedure, a History and Physical was                         performed, and patient medications and allergies were                         reviewed. The patient is competent. The risks and                         benefits of the procedure and the sedation options and                         risks were discussed with the patient. All questions                         were answered and informed consent was obtained.                         Patient identification and proposed procedure were                         verified by the physician, the nurse, the anesthetist                         and the technician in the endoscopy suite. Mental                         Status Examination: alert and oriented. Airway                         Examination: normal oropharyngeal airway and neck                         mobility. Respiratory Examination: clear to  auscultation. CV Examination: normal. Prophylactic                         Antibiotics: The patient does not require prophylactic                         antibiotics. Prior Anticoagulants: The patient has                         taken no  previous anticoagulant or antiplatelet                         agents. ASA Grade Assessment: II - A patient with mild                         systemic disease. After reviewing the risks and                         benefits, the patient was deemed in satisfactory                         condition to undergo the procedure. The anesthesia                         plan was to use monitored anesthesia care (MAC).                         Immediately prior to administration of medications,                         the patient was re-assessed for adequacy to receive                         sedatives. The heart rate, respiratory rate, oxygen                         saturations, blood pressure, adequacy of pulmonary                         ventilation, and response to care were monitored                         throughout the procedure. The physical status of the                         patient was re-assessed after the procedure.                        After obtaining informed consent, the colonoscope was                         passed under direct vision. Throughout the procedure,                         the patient's blood pressure, pulse, and oxygen                         saturations were monitored continuously. The  Colonoscope was introduced through the anus and                         advanced to the the cecum, identified by appendiceal                         orifice and ileocecal valve. The colonoscopy was                         somewhat difficult due to restricted mobility of the                         colon. Successful completion of the procedure was                         aided by withdrawing the scope and replacing with the                         pediatric colonoscope. The patient tolerated the                         procedure well. The quality of the bowel preparation                         was good. Findings:      The perianal and digital rectal  examinations were normal.      Two hyperplastic polyps were found in the transverse colon. The polyps       were less than 1 mm in size. These polyps were removed with a jumbo cold       forceps. Resection and retrieval were complete. Estimated blood loss was       minimal.      A less than 1 mm polyp was found in the descending colon. The polyp was       sessile. The polyp was removed with a jumbo cold forceps. Resection and       retrieval were complete. Estimated blood loss was minimal.      Many small-mouthed diverticula were found in the sigmoid colon.      Internal hemorrhoids were found during retroflexion. The hemorrhoids       were Grade I (internal hemorrhoids that do not prolapse).      The exam was otherwise without abnormality on direct and retroflexion       views. Impression:            - Two less than 1 mm polyps in the transverse colon,                         removed with a jumbo cold forceps. Resected and                         retrieved.                        - One less than 1 mm polyp in the descending colon,                         removed with a jumbo cold forceps. Resected and  retrieved.                        - Diverticulosis in the sigmoid colon.                        - Internal hemorrhoids.                        - The examination was otherwise normal on direct and                         retroflexion views. Recommendation:        - Discharge patient to home.                        - Resume previous diet.                        - Continue present medications.                        - Await pathology results.                        - Repeat colonoscopy is not recommended due to current                         age (64 years or older) for surveillance.                        - Return to referring physician as previously                         scheduled. Procedure Code(s):     --- Professional ---                        [phone],  Colonoscopy, flexible; with biopsy, single or                         multiple Diagnosis Code(s):     --- Professional ---                        K63.5, Polyp of colon                        Z86.010, Personal history of colonic polyps                        K64.0, First degree hemorrhoids                        K57.30, Diverticulosis of large intestine without                         perforation or abscess without bleeding CPT copyright 2019 American Medical Association. All rights reserved. The codes documented in this report are preliminary and upon coder review may  be revised to meet current compliance requirements. Andrey Farmer MD, MD 03/10/2021 10:43:47 AM Number of Addenda: 0 Note Initiated On: 03/10/2021 9:57 AM Scope Withdrawal Time: 0 hours 12 minutes 34 seconds  Total Procedure  Duration: 0 hours 36 minutes 53 seconds  Estimated Blood Loss:  Estimated blood loss was minimal.      [facility]

## 2021-03-10 NOTE — Interval H&P Note (Signed)
History and Physical Interval Note:  03/10/2021 9:52 AM  Vanessa Jones  has presented today for surgery, with the diagnosis of HX.OF COLON POLYPS.  The various methods of treatment have been discussed with the patient and family. After consideration of risks, benefits and other options for treatment, the patient has consented to  Procedure(s): COLONOSCOPY WITH PROPOFOL (N/A) as a surgical intervention.  The patient's history has been reviewed, patient examined, no change in status, stable for surgery.  I have reviewed the patient's chart and labs.  Questions were answered to the patient's satisfaction.     Lesly Rubenstein  Ok to proceed with colonoscopy

## 2021-03-10 NOTE — Transfer of Care (Signed)
Immediate Anesthesia Transfer of Care Note  Patient: Vanessa Jones  Procedure(s) Performed: COLONOSCOPY WITH PROPOFOL (N/A )  Patient Location: PACU  Anesthesia Type:MAC  Level of Consciousness: drowsy  Airway & Oxygen Therapy: Patient Spontanous Breathing  Post-op Assessment: Report given to RN and Post -op Vital signs reviewed and stable  Post vital signs: stable  Last Vitals:  Vitals Value Taken Time  BP    Temp    Pulse 70 03/10/21 1044  Resp 18 03/10/21 1044  SpO2 93 % 03/10/21 1044  Vitals shown include unvalidated device data.  Last Pain:  Vitals:   03/10/21 0930  TempSrc: Temporal  PainSc: 0-No pain         Complications: No complications documented.

## 2021-03-10 NOTE — Progress Notes (Signed)
No risk

## 2021-03-10 NOTE — Anesthesia Postprocedure Evaluation (Signed)
Anesthesia Post Note  Patient: Vanessa Jones  Procedure(s) Performed: COLONOSCOPY WITH PROPOFOL (N/A )  Patient location during evaluation: Endoscopy Anesthesia Type: General Level of consciousness: awake and alert Pain management: pain level controlled Vital Signs Assessment: post-procedure vital signs reviewed and stable Respiratory status: spontaneous breathing, nonlabored ventilation, respiratory function stable and patient connected to nasal cannula oxygen Cardiovascular status: blood pressure returned to baseline and stable Postop Assessment: no apparent nausea or vomiting Anesthetic complications: no   No complications documented.   Last Vitals:  Vitals:   03/10/21 1054 03/10/21 1104  BP: 134/85 (!) 117/92  Pulse: 62 65  Resp: 13 15  Temp:    SpO2: 100% 98%    Last Pain:  Vitals:   03/10/21 1104  TempSrc:   PainSc: 0-No pain                 Arita Miss

## 2021-03-10 NOTE — H&P (Signed)
Outpatient short stay form Pre-procedure 03/10/2021 9:48 AM Vanessa Miyamoto MD, MPH  Primary Physician: Dr. Diona Browner  Reason for visit:  Surveillance colonoscopy  History of present illness:   80 y/o lady with history of 3 TA's in 2018 here for surveillance colonoscopy. Mother with possible colon cancer in her 91's but it's not clear. No blood thinners. No blood thinners.    Current Facility-Administered Medications:  .  0.9 %  sodium chloride infusion, , Intravenous, Continuous, Kess Mcilwain, Hilton Cork, MD, Last Rate: 20 mL/hr at 03/10/21 0940, New Bag at 03/10/21 0940  Medications Prior to Admission  Medication Sig Dispense Refill Last Dose  . aspirin EC 81 MG tablet Take 81 mg by mouth daily. Swallow whole.   Past Week at Unknown time  . atorvastatin (LIPITOR) 10 MG tablet Take 1 tablet (10 mg total) by mouth daily. 90 tablet 3 03/10/2021 at Unknown time  . hydrochlorothiazide (HYDRODIURIL) 25 MG tablet TAKE 1/2 TABLET(12.5 MG) BY MOUTH DAILY 45 tablet 0 03/10/2021 at 0700  . lisinopril (ZESTRIL) 10 MG tablet TAKE 1 TABLET(10 MG) BY MOUTH DAILY 90 tablet 0 03/10/2021 at Unknown time  . VITAMIN D, CHOLECALCIFEROL, PO Take 400 Units by mouth.   03/09/2021 at Unknown time  . acetaminophen (TYLENOL) 500 MG tablet Take 500 mg by mouth every 6 (six) hours as needed.     Marland Kitchen azelastine (ASTELIN) 0.1 % nasal spray Use 2 sprays in each nostril 1-2 times daily. 30 mL 5   . diphenhydrAMINE (BENADRYL) 25 MG tablet Take 25 mg by mouth every 6 (six) hours as needed.     Marland Kitchen EPINEPHrine 0.3 mg/0.3 mL IJ SOAJ injection SMARTSIG:0.3 Milligram(s) IM Once PRN     . loratadine (CLARITIN) 10 MG tablet Take 10 mg by mouth daily. Uses as needed.     Marland Kitchen oxymetazoline (AFRIN) 0.05 % nasal spray Place 1 spray into both nostrils 2 (two) times daily as needed for congestion.      . sodium chloride (OCEAN) 0.65 % SOLN nasal spray Place 1 spray into both nostrils as needed for congestion.        Allergies  Allergen  Reactions  . Cephalexin Shortness Of Breath and Itching     Past Medical History:  Diagnosis Date  . Allergic dermatitis   . Allergic rhinitis   . Complex tear of medial meniscus of left knee   . Diverticulitis   . Eczema   . GERD (gastroesophageal reflux disease)   . History of chickenpox   . Hyperlipidemia   . Hypertension   . Osteoarthritis    LEFT KNEE  . Seasonal allergies   . Serrated adenoma of colon   . Tubular adenoma of colon     Review of systems:  Otherwise negative.    Physical Exam  Gen: Alert, oriented. Appears stated age.  HEENT:PERRLA. Lungs: No respiratory distress CV: RRR Abd: soft, benign, no masses Ext: No edema    Planned procedures: Proceed with colonoscopy. The patient understands the nature of the planned procedure, indications, risks, alternatives and potential complications including but not limited to bleeding, infection, perforation, damage to internal organs and possible oversedation/side effects from anesthesia. The patient agrees and gives consent to proceed.  Please refer to procedure notes for findings, recommendations and patient disposition/instructions.     Vanessa Miyamoto MD, MPH Gastroenterology 03/10/2021  9:48 AM

## 2021-03-11 ENCOUNTER — Encounter: Payer: Self-pay | Admitting: Gastroenterology

## 2021-03-12 LAB — SURGICAL PATHOLOGY

## 2021-04-21 ENCOUNTER — Other Ambulatory Visit: Payer: Self-pay

## 2021-04-21 ENCOUNTER — Other Ambulatory Visit (INDEPENDENT_AMBULATORY_CARE_PROVIDER_SITE_OTHER): Payer: Medicare Other

## 2021-04-21 DIAGNOSIS — E7800 Pure hypercholesterolemia, unspecified: Secondary | ICD-10-CM | POA: Diagnosis not present

## 2021-04-21 LAB — LIPID PANEL
Cholesterol: 151 mg/dL (ref 0–200)
HDL: 56.6 mg/dL (ref >39.00)
LDL Cholesterol: 73 mg/dL (ref 0–99)
NonHDL: 94.51
Total CHOL/HDL Ratio: 3
Triglycerides: 106 mg/dL (ref 0.0–149.0)
VLDL: 21.2 mg/dL (ref 0.0–40.0)

## 2021-05-07 ENCOUNTER — Other Ambulatory Visit: Payer: Self-pay | Admitting: Family Medicine

## 2021-10-06 DIAGNOSIS — Z23 Encounter for immunization: Secondary | ICD-10-CM | POA: Diagnosis not present

## 2021-10-27 ENCOUNTER — Telehealth: Payer: Self-pay | Admitting: Family Medicine

## 2021-10-27 NOTE — Telephone Encounter (Signed)
Positive Covid patient's need a virtual visit before a prescription can be called in.  Please call patient to schedule.

## 2021-10-27 NOTE — Telephone Encounter (Signed)
Called to make an appt for pt. I informed pt we did not have anything today, but we did tomorrow for her provider and other providers as well. [facility] because she said it was just a head cold. I told pt I could make an appt for her tomorrow and she said that was alright.

## 2021-10-27 NOTE — Telephone Encounter (Signed)
Pt called in stating she tested positive for covid this morning and want a prescription called in.

## 2022-01-16 DIAGNOSIS — Z1231 Encounter for screening mammogram for malignant neoplasm of breast: Secondary | ICD-10-CM | POA: Diagnosis not present

## 2022-01-16 LAB — HM MAMMOGRAPHY

## 2022-01-20 ENCOUNTER — Encounter: Payer: Self-pay | Admitting: Family Medicine

## 2022-03-10 ENCOUNTER — Other Ambulatory Visit: Payer: Self-pay | Admitting: Family Medicine

## 2022-03-10 NOTE — Telephone Encounter (Signed)
Last office visit 01/21/21 for Vanessa Jones.  Last refilled 05/07/21 for #90 with 1 refill.  No future appointments.  ?

## 2022-03-16 ENCOUNTER — Ambulatory Visit (INDEPENDENT_AMBULATORY_CARE_PROVIDER_SITE_OTHER): Payer: Medicare Other

## 2022-03-16 VITALS — Ht 67.0 in | Wt 180.0 lb

## 2022-03-16 DIAGNOSIS — Z0000 Encounter for general adult medical examination without abnormal findings: Secondary | ICD-10-CM | POA: Diagnosis not present

## 2022-03-16 NOTE — Patient Instructions (Signed)
Vanessa Jones , ?Thank you for taking time to come for your Medicare Wellness Visit. I appreciate your ongoing commitment to your health goals. Please review the following plan we discussed and let me know if I can assist you in the future.  ? ?Screening recommendations/referrals: ?Colonoscopy: not required ?Mammogram: completed 01/16/2022, due 01/17/2023 ?Bone Density: completed 02/03/2016 ?Recommended yearly ophthalmology/optometry visit for glaucoma screening and checkup ?Recommended yearly dental visit for hygiene and checkup ? ?Vaccinations: ?Influenza vaccine: due 06/30/2022 ?Pneumococcal vaccine: completed 10/05/2014 ?Tdap vaccine: due ?Shingles vaccine: discussed   ?Covid-19: 10/31/2020, 01/05/2020, 12/15/2019 ? ?Advanced directives: Advance directive discussed with you today.  ? ?Conditions/risks identified: none ? ?Next appointment: Follow up in one year for your annual wellness visit  ? ? ?Preventive Care 16 Years and Older, Female ?Preventive care refers to lifestyle choices and visits with your health care provider that can promote health and wellness. ?What does preventive care include? ?A yearly physical exam. This is also called an annual well check. ?Dental exams once or twice a year. ?Routine eye exams. Ask your health care provider how often you should have your eyes checked. ?Personal lifestyle choices, including: ?Daily care of your teeth and gums. ?Regular physical activity. ?Eating a healthy diet. ?Avoiding tobacco and drug use. ?Limiting alcohol use. ?Practicing safe sex. ?Taking low-dose aspirin every day. ?Taking vitamin and mineral supplements as recommended by your health care provider. ?What happens during an annual well check? ?The services and screenings done by your health care provider during your annual well check will depend on your age, overall health, lifestyle risk factors, and family history of disease. ?Counseling  ?Your health care provider may ask you questions about your: ?Alcohol  use. ?Tobacco use. ?Drug use. ?Emotional well-being. ?Home and relationship well-being. ?Sexual activity. ?Eating habits. ?History of falls. ?Memory and ability to understand (cognition). ?Work and work Statistician. ?Reproductive health. ?Screening  ?You may have the following tests or measurements: ?Height, weight, and BMI. ?Blood pressure. ?Lipid and cholesterol levels. These may be checked every 5 years, or more frequently if you are over 75 years old. ?Skin check. ?Lung cancer screening. You may have this screening every year starting at age 22 if you have a 30-pack-year history of smoking and currently smoke or have quit within the past 15 years. ?Fecal occult blood test (FOBT) of the stool. You may have this test every year starting at age 68. ?Flexible sigmoidoscopy or colonoscopy. You may have a sigmoidoscopy every 5 years or a colonoscopy every 10 years starting at age 63. ?Hepatitis C blood test. ?Hepatitis B blood test. ?Sexually transmitted disease (STD) testing. ?Diabetes screening. This is done by checking your blood sugar (glucose) after you have not eaten for a while (fasting). You may have this done every 1-3 years. ?Bone density scan. This is done to screen for osteoporosis. You may have this done starting at age 81. ?Mammogram. This may be done every 1-2 years. Talk to your health care provider about how often you should have regular mammograms. ?Talk with your health care provider about your test results, treatment options, and if necessary, the need for more tests. ?Vaccines  ?Your health care provider may recommend certain vaccines, such as: ?Influenza vaccine. This is recommended every year. ?Tetanus, diphtheria, and acellular pertussis (Tdap, Td) vaccine. You may need a Td booster every 10 years. ?Zoster vaccine. You may need this after age 81. ?Pneumococcal 13-valent conjugate (PCV13) vaccine. One dose is recommended after age 36. ?Pneumococcal polysaccharide (PPSV23) vaccine. One dose is  recommended after age 63. ?Talk to your health care provider about which screenings and vaccines you need and how often you need them. ?This information is not intended to replace advice given to you by your health care provider. Make sure you discuss any questions you have with your health care provider. ?Document Released: 12/13/2015 Document Revised: 08/05/2016 Document Reviewed: 09/17/2015 ?Elsevier Interactive Patient Education ? 2017 [place]. ? ?Fall Prevention in the Home ?Falls can cause injuries. They can happen to people of all ages. There are many things you can do to make your home safe and to help prevent falls. ?What can I do on the outside of my home? ?Regularly fix the edges of walkways and driveways and fix any cracks. ?Remove anything that might make you trip as you walk through a door, such as a raised step or threshold. ?Trim any bushes or trees on the path to your home. ?Use bright outdoor lighting. ?Clear any walking paths of anything that might make someone trip, such as rocks or tools. ?Regularly check to see if handrails are loose or broken. Make sure that both sides of any steps have handrails. ?Any raised decks and porches should have guardrails on the edges. ?Have any leaves, snow, or ice cleared regularly. ?Use sand or salt on walking paths during winter. ?Clean up any spills in your garage right away. This includes oil or grease spills. ?What can I do in the bathroom? ?Use night lights. ?Install grab bars by the toilet and in the tub and shower. Do not use towel bars as grab bars. ?Use non-skid mats or decals in the tub or shower. ?If you need to sit down in the shower, use a plastic, non-slip stool. ?Keep the floor dry. Clean up any water that spills on the floor as soon as it happens. ?Remove soap buildup in the tub or shower regularly. ?Attach bath mats securely with double-sided non-slip rug tape. ?Do not have throw rugs and other things on the floor that can make you  trip. ?What can I do in the bedroom? ?Use night lights. ?Make sure that you have a light by your bed that is easy to reach. ?Do not use any sheets or blankets that are too big for your bed. They should not hang down onto the floor. ?Have a firm chair that has side arms. You can use this for support while you get dressed. ?Do not have throw rugs and other things on the floor that can make you trip. ?What can I do in the kitchen? ?Clean up any spills right away. ?Avoid walking on wet floors. ?Keep items that you use a lot in easy-to-reach places. ?If you need to reach something above you, use a strong step stool that has a grab bar. ?Keep electrical cords out of the way. ?Do not use floor polish or wax that makes floors slippery. If you must use wax, use non-skid floor wax. ?Do not have throw rugs and other things on the floor that can make you trip. ?What can I do with my stairs? ?Do not leave any items on the stairs. ?Make sure that there are handrails on both sides of the stairs and use them. Fix handrails that are broken or loose. Make sure that handrails are as long as the stairways. ?Check any carpeting to make sure that it is firmly attached to the stairs. Fix any carpet that is loose or worn. ?Avoid having throw rugs at the top or bottom of the stairs. If you  do have throw rugs, attach them to the floor with carpet tape. ?Make sure that you have a light switch at the top of the stairs and the bottom of the stairs. If you do not have them, ask someone to add them for you. ?What else can I do to help prevent falls? ?Wear shoes that: ?Do not have high heels. ?Have rubber bottoms. ?Are comfortable and fit you well. ?Are closed at the toe. Do not wear sandals. ?If you use a stepladder: ?Make sure that it is fully opened. Do not climb a closed stepladder. ?Make sure that both sides of the stepladder are locked into place. ?Ask someone to hold it for you, if possible. ?Clearly mark and make sure that you can  see: ?Any grab bars or handrails. ?First and last steps. ?Where the edge of each step is. ?Use tools that help you move around (mobility aids) if they are needed. These include: ?Canes. ?Walkers. ?Scooters. ?Crutches. ?Turn

## 2022-03-16 NOTE — Progress Notes (Signed)
?I connected with Vanessa Jones today by telephone and verified that I am speaking with the correct person using two identifiers. ?Location patient: home ?Location provider: work ?Persons participating in the virtual visit: Vanessa Jones, Glenna Durand LPN. ?  ?I discussed the limitations, risks, security and privacy concerns of performing an evaluation and management service by telephone and the availability of in person appointments. I also discussed with the patient that there may be a patient responsible charge related to this service. The patient expressed understanding and verbally consented to this telephonic visit.  ?  ?Interactive audio and video telecommunications were attempted between this provider and patient, however failed, due to patient having technical difficulties OR patient did not have access to video capability.  We continued and completed visit with audio only. ? ?  ? ?Vital signs may be patient reported or missing. ? ?Subjective:  ? Vanessa Jones is a 81 y.o. female who presents for Medicare Annual (Subsequent) preventive examination. ? ?Review of Systems    ? ?Cardiac Risk Factors include: advanced age (>48mn, >>91women);hypertension ? ?   ?Objective:  ?  ?Today's Vitals  ? 03/16/22 1129  ?Weight: 180 lb (81.6 kg)  ?Height: '5\' 7"'$  (1.702 m)  ? ?Body mass index is 28.19 kg/m?. ? ? ?  03/16/2022  ? 11:35 AM 03/10/2021  ?  9:20 AM 10/06/2019  ? 11:18 AM 08/17/2017  ? 12:06 PM 02/02/2017  ? 11:02 AM 08/08/2015  ? 12:41 PM  ?Advanced Directives  ?Does Patient Have a Medical Advance Directive? No No No No No No  ?Would patient like information on creating a medical advance directive?  No - Patient declined No - Patient declined No - Patient declined No - Patient declined No - patient declined information  ? ? ?Current Medications (verified) ?Outpatient Encounter Medications as of 03/16/2022  ?Medication Sig  ? acetaminophen (TYLENOL) 500 MG tablet Take 500 mg by mouth every 6 (six) hours as needed.   ? aspirin EC 81 MG tablet Take 81 mg by mouth daily. Swallow whole.  ? atorvastatin (LIPITOR) 10 MG tablet Take 1 tablet (10 mg total) by mouth daily.  ? azelastine (ASTELIN) 0.1 % nasal spray Use 2 sprays in each nostril 1-2 times daily.  ? diphenhydrAMINE (BENADRYL) 25 MG tablet Take 25 mg by mouth every 6 (six) hours as needed.  ? EPINEPHrine 0.3 mg/0.3 mL IJ SOAJ injection SMARTSIG:0.3 Milligram(s) IM Once PRN  ? hydrochlorothiazide (HYDRODIURIL) 25 MG tablet TAKE 1/2 TABLET(12.5 MG) BY MOUTH DAILY  ? lisinopril (ZESTRIL) 10 MG tablet TAKE 1 TABLET(10 MG) BY MOUTH DAILY  ? loratadine (CLARITIN) 10 MG tablet Take 10 mg by mouth daily. Uses as needed.  ? oxymetazoline (AFRIN) 0.05 % nasal spray Place 1 spray into both nostrils 2 (two) times daily as needed for congestion.   ? sodium chloride (OCEAN) 0.65 % SOLN nasal spray Place 1 spray into both nostrils as needed for congestion.  ? VITAMIN D, CHOLECALCIFEROL, PO Take 400 Units by mouth.  ? ?No facility-administered encounter medications on file as of 03/16/2022.  ? ? ?Allergies (verified) ?Cephalexin  ? ?History: ?Past Medical History:  ?Diagnosis Date  ? Allergic dermatitis   ? Allergic rhinitis   ? Complex tear of medial meniscus of left knee   ? Diverticulitis   ? Eczema   ? GERD (gastroesophageal reflux disease)   ? History of chickenpox   ? Hyperlipidemia   ? Hypertension   ? Osteoarthritis   ? LEFT KNEE  ?  Seasonal allergies   ? Serrated adenoma of colon   ? Tubular adenoma of colon   ? ?Past Surgical History:  ?Procedure Laterality Date  ? CHOLECYSTECTOMY    ? COLONOSCOPY    ? COLONOSCOPY WITH PROPOFOL N/A 08/17/2017  ? Procedure: COLONOSCOPY WITH PROPOFOL;  Surgeon: Lollie Sails, MD;  Location[facility] ENDOSCOPY;  Service: Endoscopy;  Laterality: N/A;  ? COLONOSCOPY WITH PROPOFOL N/A 03/10/2021  ? Procedure: COLONOSCOPY WITH PROPOFOL;  Surgeon: Lesly Rubenstein, MD;  Location[facility] ENDOSCOPY;  Service: Endoscopy;  Laterality: N/A;  ? HEMORRHOID  SURGERY    ? KNEE ARTHROSCOPY WITH MENISCAL REPAIR Left 08/08/2015  ? Procedure: KNEE ARTHROSCOPY WITH medial and lateral menisectomy;  Surgeon: Corky Mull, MD;  Location: [facility] ORS;  Service: Orthopedics;  Laterality: Left;  ? TUBAL LIGATION    ? ?Family History  ?Problem Relation Age of Onset  ? Hypertension Mother   ? Cancer Mother   ?     colon, age 37  ? Diabetes Father   ? Emphysema Father   ? Cancer Sister   ?     breast  ? Asthma Sister   ? Cancer Brother   ?     bladder  ? Coronary artery disease Brother   ? Coronary artery disease Maternal Grandmother   ? Heart disease Paternal Grandmother   ?     enlarged heart  ? Stroke Paternal Grandfather   ? Allergic rhinitis Neg Hx   ? Angioedema Neg Hx   ? Atopy Neg Hx   ? Eczema Neg Hx   ? Immunodeficiency Neg Hx   ? ?Social History  ? ?Socioeconomic History  ? Marital status: Married  ?  Spouse name: Not on file  ? Number of children: 2  ? Years of education: Not on file  ? Highest education level: Not on file  ?Occupational History  ? Occupation: Herbalist, retired  ?Tobacco Use  ? Smoking status: Never  ? Smokeless tobacco: Never  ?Vaping Use  ? Vaping Use: Never used  ?Substance and Sexual Activity  ? Alcohol use: Yes  ?  Comment: Occ glass of wine  ? Drug use: No  ? Sexual activity: Not on file  ?Other Topics Concern  ? Not on file  ?Social History Narrative  ? Retired Herbalist  ? Married, 2 healthy boys   ? Regular exercise- yes, walking 3 days a week.  ? 2006 treadmill stress test, low risk.  ?   ? Moderate diet  ? No HCOPA, no living will, info given (reviewed 2015)  ? ?Social Determinants of Health  ? ?Financial Resource Strain: Low Risk   ? Difficulty of Paying Living Expenses: Not hard at all  ?Food Insecurity: No Food Insecurity  ? Worried About Charity fundraiser in the Last Year: Never true  ? Ran Out of Food in the Last Year: Never true  ?Transportation Needs: No Transportation Needs  ? Lack of Transportation (Medical): No  ? Lack of  Transportation (Non-Medical): No  ?Physical Activity: Inactive  ? Days of Exercise per Week: 0 days  ? Minutes of Exercise per Session: 0 min  ?Stress: No Stress Concern Present  ? Feeling of Stress : Not at all  ?Social Connections: Not on file  ? ? ?Tobacco Counseling ?Counseling given: Not Answered ? ? ?Clinical Intake: ? ?Pre-visit preparation completed: Yes ? ?Pain : No/denies pain ? ?  ? ?Nutritional Status: BMI 25 -29 Overweight ?Nutritional Risks: None ?Diabetes:  No ? ?How often do you need to have someone help you when you read instructions, pamphlets, or other written materials from your doctor or pharmacy?: 1 - Never ?What is the last grade level you completed in school?: 12th grade ? ?Diabetic? no ? ?Interpreter Needed?: No ? ?Information entered by :: NAllen LPN ? ? ?Activities of Daily Living ? ?  03/16/2022  ? 11:36 AM  ?In your present state of health, do you have any difficulty performing the following activities:  ?Hearing? 1  ?Comment has hearing aids  ?Vision? 0  ?Difficulty concentrating or making decisions? 0  ?Walking or climbing stairs? 0  ?Dressing or bathing? 0  ?Doing errands, shopping? 0  ?Preparing Food and eating ? N  ?Using the Toilet? N  ?In the past six months, have you accidently leaked urine? N  ?Do you have problems with loss of bowel control? N  ?Managing your Medications? N  ?Managing your Finances? N  ?Housekeeping or managing your Housekeeping? N  ? ? ?Patient Care Team: ?Jinny Sanders, MD as PCP - General ?Debbora Dus, [facility] as Pharmacist (Pharmacist) ? ?Indicate any recent Medical Services you may have received from other than [facility] providers in the past year (date may be approximate). ? ?   ?Assessment:  ? This is a routine wellness examination for Bev. ? ?Hearing/Vision screen ?Vision Screening - Comments:: No regular eye exams, ? ?Dietary issues and exercise activities discussed: ?Current Exercise Habits: The patient does not participate in regular exercise at  present ? ? Goals Addressed   ? ?  ?  ?  ?  ? This Visit's Progress  ?  Patient Stated     ?  03/16/2022, wants to get dental work done ?  ? ?  ? ?Depression Screen ? ?  03/16/2022  ? 11:36 AM 01/21/2021  ? 10:56 AM 11/6

## 2022-04-07 ENCOUNTER — Other Ambulatory Visit: Payer: Self-pay | Admitting: Family Medicine

## 2022-04-13 ENCOUNTER — Other Ambulatory Visit (INDEPENDENT_AMBULATORY_CARE_PROVIDER_SITE_OTHER): Payer: Medicare Other

## 2022-04-13 ENCOUNTER — Telehealth: Payer: Self-pay | Admitting: *Deleted

## 2022-04-13 ENCOUNTER — Telehealth: Payer: Self-pay | Admitting: Family Medicine

## 2022-04-13 DIAGNOSIS — E559 Vitamin D deficiency, unspecified: Secondary | ICD-10-CM

## 2022-04-13 DIAGNOSIS — R7303 Prediabetes: Secondary | ICD-10-CM

## 2022-04-13 DIAGNOSIS — E7800 Pure hypercholesterolemia, unspecified: Secondary | ICD-10-CM | POA: Diagnosis not present

## 2022-04-13 LAB — COMPREHENSIVE METABOLIC PANEL
ALT: 8 U/L (ref 0–35)
AST: 15 U/L (ref 0–37)
Albumin: 4.2 g/dL (ref 3.5–5.2)
Alkaline Phosphatase: 69 U/L (ref 39–117)
BUN: 13 mg/dL (ref 6–23)
CO2: 25 mEq/L (ref 19–32)
Calcium: 9.5 mg/dL (ref 8.4–10.5)
Chloride: 102 mEq/L (ref 96–112)
Creatinine, Ser: 0.82 mg/dL (ref 0.40–1.20)
GFR: 68.29 mL/min (ref >60.00)
Glucose, Bld: 93 mg/dL (ref 70–99)
Potassium: 4.4 mEq/L (ref 3.5–5.1)
Sodium: 136 mEq/L (ref 135–145)
Total Bilirubin: 0.5 mg/dL (ref 0.2–1.2)
Total Protein: 6.5 g/dL (ref 6.0–8.3)

## 2022-04-13 LAB — LIPID PANEL
Cholesterol: 211 mg/dL — ABNORMAL HIGH (ref 0–200)
HDL: 59.6 mg/dL (ref >39.00)
LDL Cholesterol: 130 mg/dL — ABNORMAL HIGH (ref 0–99)
NonHDL: 151.26
Total CHOL/HDL Ratio: 4
Triglycerides: 108 mg/dL (ref 0.0–149.0)
VLDL: 21.6 mg/dL (ref 0.0–40.0)

## 2022-04-13 LAB — HEMOGLOBIN A1C: Hgb A1c MFr Bld: 6 % (ref 4.6–6.5)

## 2022-04-13 LAB — VITAMIN D 25 HYDROXY (VIT D DEFICIENCY, FRACTURES): VITD: 46.96 ng/mL (ref 30.00–100.00)

## 2022-04-13 NOTE — Progress Notes (Signed)
No critical labs need to be addressed urgently. We will discuss labs in detail at upcoming office visit.   

## 2022-04-13 NOTE — Telephone Encounter (Signed)
-----   Message from Velna Hatchet, RT sent at 03/30/2022  8:26 AM EDT ----- ?Regarding: Lab [name] 04/13/22 ?Patient is scheduled for cpx, please order future labs.  Thanks, Anda Kraft  ? ?

## 2022-04-13 NOTE — Telephone Encounter (Signed)
Pt came in today for labs her CPE next week. No orders are in please put orders in for pt. Thanks ?

## 2022-04-23 ENCOUNTER — Ambulatory Visit (INDEPENDENT_AMBULATORY_CARE_PROVIDER_SITE_OTHER): Payer: Medicare Other | Admitting: Family Medicine

## 2022-04-23 VITALS — BP 132/78 | HR 94 | Temp 98.0°F | Resp 16 | Ht 64.0 in | Wt 181.4 lb

## 2022-04-23 DIAGNOSIS — M8589 Other specified disorders of bone density and structure, multiple sites: Secondary | ICD-10-CM | POA: Diagnosis not present

## 2022-04-23 DIAGNOSIS — E559 Vitamin D deficiency, unspecified: Secondary | ICD-10-CM | POA: Diagnosis not present

## 2022-04-23 DIAGNOSIS — I10 Essential (primary) hypertension: Secondary | ICD-10-CM | POA: Diagnosis not present

## 2022-04-23 DIAGNOSIS — R7303 Prediabetes: Secondary | ICD-10-CM

## 2022-04-23 DIAGNOSIS — E7800 Pure hypercholesterolemia, unspecified: Secondary | ICD-10-CM | POA: Diagnosis not present

## 2022-04-23 DIAGNOSIS — M1712 Unilateral primary osteoarthritis, left knee: Secondary | ICD-10-CM

## 2022-04-23 MED ORDER — ATORVASTATIN CALCIUM 10 MG PO TABS: 5.0000 mg | ORAL_TABLET | ORAL | 11 refills | Status: DC

## 2022-04-23 NOTE — Assessment & Plan Note (Signed)
Stable, chronic.  Continue current medication.    

## 2022-04-23 NOTE — Assessment & Plan Note (Signed)
Chronic, due for bone density

## 2022-04-23 NOTE — Assessment & Plan Note (Signed)
Chronic stable control

## 2022-04-23 NOTE — Progress Notes (Signed)
Patient ID: Vanessa Jones, female    DOB: [birth year], 81 y.o.   MRN: [id number]  This visit was conducted in person.  Blood pressure 132/78, pulse 94, temperature 98 F (36.7 C), resp. rate 16, height '5\' 4"'$  (1.626 m), weight 181 lb 7 oz (82.3 kg), SpO2 94 %.   CC:  Annual 2   Subjective:   HPI: The patient presents for  review of chronic health problems. He/She also has the following acute concerns today: currently dealing with teeth issue, implants  The patient saw a LPN or RN for medicare wellness visit.  Prevention and wellness was reviewed in detail. Note reviewed and important notes copied below.    Left knee pain. HX of torn ligament, OA.   Upper back feels weak.. no pain     Hypertension:       Wt Readings from Last 3 Encounters:  04/23/22 181 lb 7 oz (82.3 kg)  03/16/22 180 lb (81.6 kg)  03/10/21 182 lb (82.6 kg)    Wt Readings from Last 3 Encounters:  04/23/22 181 lb 7 oz (82.3 kg)  03/16/22 180 lb (81.6 kg)  03/10/21 182 lb (82.6 kg)   At goal on HCTZ and lisinopril  Using medication without problems or lightheadedness:  none Chest pain with exertion: none Edema: none Short of breath: stable overall Average home BPs: Other issues:   Prediabetes  Lab Results  Component Value Date   HGBA1C 6.0 04/13/2022    Elevated Cholesterol:  inadequate  control as not compliant with statin Lab Results  Component Value Date   CHOL 211 (H) 04/13/2022   HDL 59.60 04/13/2022   LDLCALC 130 (H) 04/13/2022   TRIG 108.0 04/13/2022   CHOLHDL 4 04/13/2022  Using medications without problems: Muscle aches:   YES with statin Diet compliance: Exercise: Other complaints:   Vit D at goal on supplement D3        Relevant past medical, surgical, family and social history reviewed and updated as indicated. Interim medical history since our last visit reviewed. Allergies and medications reviewed and updated. Outpatient Medications Prior to Visit  Medication  Sig Dispense Refill   acetaminophen (TYLENOL) 500 MG tablet Take 500 mg by mouth every 6 (six) hours as needed.     aspirin EC 81 MG tablet Take 81 mg by mouth daily. Swallow whole.     atorvastatin (LIPITOR) 10 MG tablet Take 1 tablet (10 mg total) by mouth daily. 90 tablet 3   azelastine (ASTELIN) 0.1 % nasal spray Use 2 sprays in each nostril 1-2 times daily. 30 mL 5   diphenhydrAMINE (BENADRYL) 25 MG tablet Take 25 mg by mouth every 6 (six) hours as needed.     EPINEPHrine 0.3 mg/0.3 mL IJ SOAJ injection SMARTSIG:0.3 Milligram(s) IM Once PRN     hydrochlorothiazide (HYDRODIURIL) 25 MG tablet TAKE 1/2 TABLET(12.5 MG) BY MOUTH DAILY 45 tablet 0   lisinopril (ZESTRIL) 10 MG tablet TAKE 1 TABLET(10 MG) BY MOUTH DAILY 90 tablet 0   loratadine (CLARITIN) 10 MG tablet Take 10 mg by mouth daily. Uses as needed.     oxymetazoline (AFRIN) 0.05 % nasal spray Place 1 spray into both nostrils 2 (two) times daily as needed for congestion.      sodium chloride (OCEAN) 0.65 % SOLN nasal spray Place 1 spray into both nostrils as needed for congestion.     VITAMIN D, CHOLECALCIFEROL, PO Take 400 Units by mouth.     No facility-administered medications prior  to visit.     Per HPI unless specifically indicated in ROS section below Review of Systems  Constitutional:  Negative for fatigue and fever.  HENT:  Negative for congestion.   Eyes:  Negative for pain.  Respiratory:  Negative for cough and shortness of breath.   Cardiovascular:  Negative for chest pain, palpitations and leg swelling.  Gastrointestinal:  Negative for abdominal pain.  Genitourinary:  Negative for dysuria and vaginal bleeding.  Musculoskeletal:  Negative for back pain.  Neurological:  Negative for syncope, light-headedness and headaches.  Psychiatric/Behavioral:  Negative for dysphoric mood.   Objective:  There were no vitals taken for this visit.  Wt Readings from Last 3 Encounters:  03/16/22 180 lb (81.6 kg)  03/10/21 182 lb  (82.6 kg)  01/21/21 183 lb (83 kg)      Physical Exam Vitals and nursing note reviewed.  Constitutional:      General: She is not in acute distress.    Appearance: Normal appearance. She is well-developed. She is not ill-appearing or toxic-appearing.  HENT:     Head: Normocephalic.     Right Ear: Hearing, tympanic membrane, ear canal and external ear normal.     Left Ear: Hearing, tympanic membrane, ear canal and external ear normal.     Nose: Nose normal.  Eyes:     General: Lids are normal. Lids are everted, no foreign bodies appreciated.     Conjunctiva/sclera: Conjunctivae normal.     Pupils: Pupils are equal, round, and reactive to light.  Neck:     Thyroid: No thyroid mass or thyromegaly.     Vascular: No carotid bruit.     Trachea: Trachea normal.  Cardiovascular:     Rate and Rhythm: Normal rate and regular rhythm.     Heart sounds: Normal heart sounds, S1 normal and S2 normal. No murmur heard.   No gallop.  Pulmonary:     Effort: Pulmonary effort is normal. No respiratory distress.     Breath sounds: Normal breath sounds. No wheezing, rhonchi or rales.  Abdominal:     General: Bowel sounds are normal. There is no distension or abdominal bruit.     Palpations: Abdomen is soft. There is no fluid wave or mass.     Tenderness: There is no abdominal tenderness. There is no guarding or rebound.     Hernia: No hernia is present.  Musculoskeletal:     Cervical back: Normal range of motion and neck supple.  Lymphadenopathy:     Cervical: No cervical adenopathy.  Skin:    General: Skin is warm and dry.     Findings: No rash.  Neurological:     Mental Status: She is alert.     Cranial Nerves: No cranial nerve deficit.     Sensory: No sensory deficit.  Psychiatric:        Mood and Affect: Mood is not anxious or depressed.        Speech: Speech normal.        Behavior: Behavior normal. Behavior is cooperative.        Judgment: Judgment normal.      Results for orders  placed or performed in visit on 04/13/22  VITAMIN D 25 Hydroxy (Vit-D Deficiency, Fractures)  Result Value Ref Range   VITD 46.96 30.00 - 100.00 ng/mL  Comprehensive metabolic panel  Result Value Ref Range   Sodium 136 135 - 145 mEq/L   Potassium 4.4 3.5 - 5.1 mEq/L   Chloride 102 96 -  112 mEq/L   CO2 25 19 - 32 mEq/L   Glucose, Bld 93 70 - 99 mg/dL   BUN 13 6 - 23 mg/dL   Creatinine, Ser 0.82 0.40 - 1.20 mg/dL   Total Bilirubin 0.5 0.2 - 1.2 mg/dL   Alkaline Phosphatase 69 39 - 117 U/L   AST 15 0 - 37 U/L   ALT 8 0 - 35 U/L   Total Protein 6.5 6.0 - 8.3 g/dL   Albumin 4.2 3.5 - 5.2 g/dL   GFR 68.29 >60.00 mL/min   Calcium 9.5 8.4 - 10.5 mg/dL  Lipid panel  Result Value Ref Range   Cholesterol 211 (H) 0 - 200 mg/dL   Triglycerides 108.0 0.0 - 149.0 mg/dL   HDL 59.60 >39.00 mg/dL   VLDL 21.6 0.0 - 40.0 mg/dL   LDL Cholesterol 130 (H) 0 - 99 mg/dL   Total CHOL/HDL Ratio 4    NonHDL 151.26   Hemoglobin A1c  Result Value Ref Range   Hgb A1c MFr Bld 6.0 4.6 - 6.5 %     COVID 19 screen:  No recent travel or known exposure to COVID19 The patient denies respiratory symptoms of COVID 19 at this time. The importance of social distancing was discussed today.   Assessment and Plan The patient's preventative maintenance and recommended screening tests for an annual wellness exam were reviewed in full today. Brought up to date unless services declined.  Counselled on the importance of diet, exercise, and its role in overall health and mortality. The patient's FH and SH was reviewed, including their home life, tobacco status, and drug and alcohol status.   Colon: 02/2021 Dr. Gustavo Lah, [facility],no further indicated  Family history of colon cancer in mother .   PAP/DVE: PAP not indicated due to age. Has decided no DVE, no symptoms, no family history of uterine and ovarian cancer.   DEXA: 2017 normal improved, recheck in 2023 DUE Mammogram: Last 2022, sister with breast cancer. Repeat  yearly. Completed 01/16/2022 Vaccines:  Consider Td and  shingles vaccine    Uptodate with PNA, S/P COVID x 3 , consider booster  Hep C: due   Problem List Items Addressed This Visit     Essential hypertension, benign (Chronic)    Stable, chronic.  Continue current medication.   HCTZ 25 mg daily Lisinopril 10 mg daily      Relevant Medications   atorvastatin (LIPITOR) 10 MG tablet (Start on 04/24/2022)   Osteopenia (Chronic)    Chronic, due for bone density       Relevant Orders   DG Bone Density   Pure hypercholesterolemia - Primary (Chronic)     Chronic,Poor control  Previously well controlled on atorvastatin 10 mg daily.. myalgia at that dose... will try Coenzyme Q10 and weekly dosing.  Recheck cholesterol panel in 3  Months.       Relevant Medications   atorvastatin (LIPITOR) 10 MG tablet (Start on 04/24/2022)   Osteoarthritis of left knee    Chronic, worsening  Treat with topical Voltaren gel 4 times daily.Marland Kitchen if not improving consider steroid injection/referral.       Prediabetes    Chronic stable control       Vitamin D deficiency    Stable, chronic.  Continue current medication.         Meds ordered this encounter  Medications   atorvastatin (LIPITOR) 10 MG tablet    Sig: Take 0.5 tablets (5 mg total) by mouth every Monday, Wednesday, and Friday.  Dispense:  6 tablet    Refill:  11   Orders Placed This Encounter  Procedures   DG Bone Density    Standing Status:   Future    Standing Expiration Date:   04/24/2023    Order Specific Question:   Reason for Exam (SYMPTOM  OR DIAGNOSIS REQUIRED)    Answer:   osteopenia    Order Specific Question:   Preferred imaging location?    Answer:   [facility]      Eliezer Lofts, MD

## 2022-04-23 NOTE — Assessment & Plan Note (Addendum)
Chronic,Poor control  Previously well controlled on atorvastatin 10 mg daily.. myalgia at that dose... will try Coenzyme Q10 and weekly dosing.  Recheck cholesterol panel in 3  Months.

## 2022-04-23 NOTE — Assessment & Plan Note (Signed)
Stable, chronic.  Continue current medication.   HCTZ 25 mg daily Lisinopril 10 mg daily

## 2022-04-23 NOTE — Assessment & Plan Note (Signed)
Chronic, worsening  Treat with topical Voltaren gel 4 times daily.Marland Kitchen if not improving consider steroid injection/referral.

## 2022-04-23 NOTE — Patient Instructions (Addendum)
Start voltaren cream/gel  4 times daily on left knee.  Start upper body stretches and home PT.  Start atorvastatin 5 mg M, W, F  Start Co  Enzyme Q10 for statin associated myalgia.  Please call the location of your choice from the menu below to schedule your Mammogram and/or Bone Density appointment.    [facility]                      Phone:  [phone], [place] [address]                                                             Services: Traditional and 3D Mammogram, [place] Bone Density                 Phone: [phone] [address], [place] [address]    Service: Bone Density ONLY   *this site does NOT perform mammograms  [facility]                        Phone:  [phone] [address], [place] [address]                                            Services:  3D Mammogram and [place] at [facility]   Phone:  [phone]   [place], [place] [address]                                            Services: 3D Mammogram and [place]   at [facility])  Phone:  [phone]   [address]  [place], [place] [address]                                              Services:  3D Mammogram and Bone Density  

## 2022-07-15 ENCOUNTER — Other Ambulatory Visit: Payer: Self-pay | Admitting: Family Medicine

## 2022-08-23 ENCOUNTER — Other Ambulatory Visit: Payer: Self-pay | Admitting: Family Medicine

## 2022-08-23 NOTE — Telephone Encounter (Signed)
Please call and schedule fasting lab only appointment to recheck cholesterol.

## 2022-08-24 NOTE — Telephone Encounter (Signed)
Patient scheduled.

## 2022-08-27 ENCOUNTER — Telehealth: Payer: Self-pay | Admitting: Family Medicine

## 2022-08-27 DIAGNOSIS — R7303 Prediabetes: Secondary | ICD-10-CM

## 2022-08-27 DIAGNOSIS — E7800 Pure hypercholesterolemia, unspecified: Secondary | ICD-10-CM

## 2022-08-27 DIAGNOSIS — Z1159 Encounter for screening for other viral diseases: Secondary | ICD-10-CM

## 2022-08-27 DIAGNOSIS — E559 Vitamin D deficiency, unspecified: Secondary | ICD-10-CM

## 2022-08-27 NOTE — Telephone Encounter (Signed)
-----   Message from Ellamae Sia sent at 08/24/2022  3:15 PM EDT ----- Regarding: Lab orders for Friday, 9.29.23 Lab orders, no f/u appt

## 2022-08-28 ENCOUNTER — Other Ambulatory Visit (INDEPENDENT_AMBULATORY_CARE_PROVIDER_SITE_OTHER): Payer: Medicare Other

## 2022-08-28 DIAGNOSIS — R7303 Prediabetes: Secondary | ICD-10-CM

## 2022-08-28 DIAGNOSIS — E7800 Pure hypercholesterolemia, unspecified: Secondary | ICD-10-CM

## 2022-08-28 DIAGNOSIS — Z1159 Encounter for screening for other viral diseases: Secondary | ICD-10-CM | POA: Diagnosis not present

## 2022-08-28 LAB — COMPREHENSIVE METABOLIC PANEL
ALT: 9 U/L (ref 0–35)
AST: 16 U/L (ref 0–37)
Albumin: 4.3 g/dL (ref 3.5–5.2)
Alkaline Phosphatase: 66 U/L (ref 39–117)
BUN: 16 mg/dL (ref 6–23)
CO2: 27 mEq/L (ref 19–32)
Calcium: 9.5 mg/dL (ref 8.4–10.5)
Chloride: 102 mEq/L (ref 96–112)
Creatinine, Ser: 0.99 mg/dL (ref 0.40–1.20)
GFR: 54.33 mL/min — ABNORMAL LOW (ref >60.00)
Glucose, Bld: 90 mg/dL (ref 70–99)
Potassium: 3.8 mEq/L (ref 3.5–5.1)
Sodium: 137 mEq/L (ref 135–145)
Total Bilirubin: 0.6 mg/dL (ref 0.2–1.2)
Total Protein: 6.9 g/dL (ref 6.0–8.3)

## 2022-08-28 LAB — HEMOGLOBIN A1C: Hgb A1c MFr Bld: 6 % (ref 4.6–6.5)

## 2022-08-28 LAB — LIPID PANEL
Cholesterol: 179 mg/dL (ref 0–200)
HDL: 60.5 mg/dL (ref >39.00)
LDL Cholesterol: 102 mg/dL — ABNORMAL HIGH (ref 0–99)
NonHDL: 118.08
Total CHOL/HDL Ratio: 3
Triglycerides: 81 mg/dL (ref 0.0–149.0)
VLDL: 16.2 mg/dL (ref 0.0–40.0)

## 2022-08-31 ENCOUNTER — Telehealth: Payer: Self-pay | Admitting: Family Medicine

## 2022-08-31 LAB — HEPATITIS C ANTIBODY: Hepatitis C Ab: NONREACTIVE

## 2022-08-31 NOTE — Telephone Encounter (Signed)
Spoke with patient as discussed lab results.  She result note from 08/28/22 labs.

## 2022-08-31 NOTE — Telephone Encounter (Signed)
Pt returned a missed call from [facility] regarding lab results. Pt requested a call back @ [phone]

## 2022-09-01 MED ORDER — ATORVASTATIN CALCIUM 10 MG PO TABS: 10.0000 mg | ORAL_TABLET | ORAL | 3 refills | Status: DC

## 2022-09-01 NOTE — Progress Notes (Signed)
Sent in prescription as requested. Re check cholesterol in 3 months fasting.

## 2022-09-01 NOTE — Progress Notes (Signed)
Vanessa Jones notified as instructed by telephone.  She will call back to schedule fasting lab appointment in 3 months to recheck Lipid.

## 2022-09-01 NOTE — Addendum Note (Signed)
Addended by: Eliezer Lofts E on: 09/01/2022 08:29 AM   Modules accepted: Orders

## 2022-12-28 DIAGNOSIS — Z23 Encounter for immunization: Secondary | ICD-10-CM | POA: Diagnosis not present

## 2023-03-05 ENCOUNTER — Other Ambulatory Visit: Payer: Self-pay | Admitting: Family Medicine

## 2023-03-05 NOTE — Telephone Encounter (Signed)
Lvmtcb, sent mychart message  

## 2023-03-05 NOTE — Telephone Encounter (Signed)
Patient has been scheduled

## 2023-03-05 NOTE — Telephone Encounter (Signed)
Please schedule Medicare Wellness with nurse and CPE with fasting labs prior with Dr. Bedsole. 

## 2023-03-17 ENCOUNTER — Telehealth: Payer: Self-pay | Admitting: *Deleted

## 2023-03-17 DIAGNOSIS — E559 Vitamin D deficiency, unspecified: Secondary | ICD-10-CM

## 2023-03-17 DIAGNOSIS — R7303 Prediabetes: Secondary | ICD-10-CM

## 2023-03-17 DIAGNOSIS — E7800 Pure hypercholesterolemia, unspecified: Secondary | ICD-10-CM

## 2023-03-17 NOTE — Telephone Encounter (Signed)
-----   Message from [name] sent at 03/17/2023 11:46 AM EDT ----- Regarding: Lab orders for Tuesday, 5.7.24 Patient is scheduled for CPX labs, please order future labs, Thanks , Camelia Eng

## 2023-03-30 ENCOUNTER — Ambulatory Visit (INDEPENDENT_AMBULATORY_CARE_PROVIDER_SITE_OTHER): Payer: Medicare Other

## 2023-03-30 VITALS — Ht 67.0 in | Wt 185.0 lb

## 2023-03-30 DIAGNOSIS — Z780 Asymptomatic menopausal state: Secondary | ICD-10-CM | POA: Diagnosis not present

## 2023-03-30 DIAGNOSIS — Z0000 Encounter for general adult medical examination without abnormal findings: Secondary | ICD-10-CM | POA: Diagnosis not present

## 2023-03-30 NOTE — Patient Instructions (Signed)
Vanessa Jones , Thank you for taking time to come for your Medicare Wellness Visit. I appreciate your ongoing commitment to your health goals. Please review the following plan we discussed and let me know if I can assist you in the future.   These are the goals we discussed:  Goals       Patient Stated      10/06/2019, I will continue walking twice a week for 30 minutes.       Patient Stated      03/16/2022, wants to get dental work done      Patient Stated (pt-stated)      Lose weight      Pharmacy Care Plan      CARE PLAN ENTRY  Current Barriers:  Chronic Disease Management support, education, and care coordination needs related to Hypertension and Hyperlipidemia   Hypertension BP Readings from Last 3 Encounters:  02/15/20 136/74  10/10/19 130/88  02/08/19 120/70  Pharmacist Clinical Goal(s): Over the next 3 months, patient will work with PharmD and providers to maintain BP goal <140/90 mmHg Current regimen:  HCTZ 12.5 mg - 1 tablet daily  Lisinopril 10 mg - 1 tablet daily Interventions: Counseled on importance of avoiding missed doses to maintain stable blood pressure and receive full benefit from medication  Patient self care activities - Over the next 3 months, patient will: Consider using a pillbox to prevent missed doses  Ensure daily salt intake < 2300 mg/day  Hyperlipidemia Lab Results  Component Value Date/Time   LDLCALC 89 10/06/2019 08:07 AM  Pharmacist Clinical Goal(s): Over the next 3 months, patient will work with PharmD and providers to maintain LDL goal < 100 Current regimen:  No pharmacotherapy Interventions: Discussed goals for reducing cardiovascular risk including exercising 150 minutes weekly and eating a heart healthy diet Patient self care activities - Over the next 3 months, patient will: Slowly increase exercise with goal of 30 minutes, 5 days per week Incorporate a healthy diet high in vegetables, fruits and whole grains with Jones-fat dairy  products, chicken, fish, legumes, non-tropical vegetable oils and nuts. Limit intake of sweets, sugar-sweetened beverages and red meats. May discontinue aspirin as risks of bleeding usually outweigh benefits in adults 56 or older   Allergies Pharmacist Clinical Goal(s) Over the next 3 months, patient will work with PharmD and providers to reduce daytime fatigue and maintain allergy symptom control Current regimen:  Diphenhydramine 25 mg - 1 tablet every other day as needed Sodium chloride 0.65% nasal spray - 1 spray in each nostril as needed Interventions: Discussed less sedating and less drying antihistamines such as cetirizine (Zyrtec), levocetirizine (Xyzal). Benadryl is sedating and may worsen daytime fatigue.  Patient self care activities - Over the next 3 months, patient will: Consider switching Benadryl to a less sedating and less drying antihistamine such as cetirizine (Zyrtec) or levocetirizine (Xyzal). If taking Benadryl, try taking it at bedtime to reduce daytime fatigue. Try a daily multivitamin if not eating a well balanced diet.   Initial goal documentation        This is a list of the screening recommended for you and due dates:  Health Maintenance  Topic Date Due   Zoster (Shingles) Vaccine (1 of 2) Never done   DTaP/Tdap/Td vaccine (2 - Tdap) 10/30/2018   DEXA scan (bone density measurement)  02/02/2021   COVID-19 Vaccine (4 - 2023-24 season) 07/31/2022   Mammogram  01/16/2023   Flu Shot  07/01/2023   Medicare Annual Wellness Visit  03/29/2024   Pneumonia Vaccine  Completed   Hepatitis C Screening: USPSTF Recommendation to screen - Ages 38-79 yo.  Completed   HPV Vaccine  Aged Out   Colon Cancer Screening  Discontinued    Advanced directives: None  Conditions/risks identified:Aim for 30 minutes of exercise or brisk walking, 6-8 glasses of water, and 5 servings of fruits and vegetables each day.   Next appointment: Follow up in one year for your annual  wellness visit 03/30/24 @9 :45 televist   Preventive Care 65 Years and Older, Female Preventive care refers to lifestyle choices and visits with your health care provider that can promote health and wellness. What does preventive care include? A yearly physical exam. This is also called an annual well check. Dental exams once or twice a year. Routine eye exams. Ask your health care provider how often you should have your eyes checked. Personal lifestyle choices, including: Daily care of your teeth and gums. Regular physical activity. Eating a healthy diet. Avoiding tobacco and drug use. Limiting alcohol use. Practicing safe sex. Taking Jones-dose aspirin every day. Taking vitamin and mineral supplements as recommended by your health care provider. What happens during an annual well check? The services and screenings done by your health care provider during your annual well check will depend on your age, overall health, lifestyle risk factors, and family history of disease. Counseling  Your health care provider may ask you questions about your: Alcohol use. Tobacco use. Drug use. Emotional well-being. Home and relationship well-being. Sexual activity. Eating habits. History of falls. Memory and ability to understand (cognition). Work and work Astronomer. Reproductive health. Screening  You may have the following tests or measurements: Height, weight, and BMI. Blood pressure. Lipid and cholesterol levels. These may be checked every 5 years, or more frequently if you are over 56 years old. Skin check. Lung cancer screening. You may have this screening every year starting at age 31 if you have a 30-pack-year history of smoking and currently smoke or have quit within the past 15 years. Fecal occult blood test (FOBT) of the stool. You may have this test every year starting at age 23. Flexible sigmoidoscopy or colonoscopy. You may have a sigmoidoscopy every 5 years or a colonoscopy  every 10 years starting at age 91. Hepatitis C blood test. Hepatitis B blood test. Sexually transmitted disease (STD) testing. Diabetes screening. This is done by checking your blood sugar (glucose) after you have not eaten for a while (fasting). You may have this done every 1-3 years. Bone density scan. This is done to screen for osteoporosis. You may have this done starting at age 56. Mammogram. This may be done every 1-2 years. Talk to your health care provider about how often you should have regular mammograms. Talk with your health care provider about your test results, treatment options, and if necessary, the need for more tests. Vaccines  Your health care provider may recommend certain vaccines, such as: Influenza vaccine. This is recommended every year. Tetanus, diphtheria, and acellular pertussis (Tdap, Td) vaccine. You may need a Td booster every 10 years. Zoster vaccine. You may need this after age 71. Pneumococcal 13-valent conjugate (PCV13) vaccine. One dose is recommended after age 21. Pneumococcal polysaccharide (PPSV23) vaccine. One dose is recommended after age 75. Talk to your health care provider about which screenings and vaccines you need and how often you need them. This information is not intended to replace advice given to you by your health care provider. Make sure you  discuss any questions you have with your health care provider. Document Released: 12/13/2015 Document Revised: 08/05/2016 Document Reviewed: 09/17/2015 Elsevier Interactive Patient Education  2017 [employer].  Fall Prevention in the Home Falls can cause injuries. They can happen to people of all ages. There are many things you can do to make your home safe and to help prevent falls. What can I do on the outside of my home? Regularly fix the edges of walkways and driveways and fix any cracks. Remove anything that might make you trip as you walk through a door, such as a raised step or threshold. Trim  any bushes or trees on the path to your home. Use bright outdoor lighting. Clear any walking paths of anything that might make someone trip, such as rocks or tools. Regularly check to see if handrails are loose or broken. Make sure that both sides of any steps have handrails. Any raised decks and porches should have guardrails on the edges. Have any leaves, snow, or ice cleared regularly. Use sand or salt on walking paths during winter. Clean up any spills in your garage right away. This includes oil or grease spills. What can I do in the bathroom? Use night lights. Install grab bars by the toilet and in the tub and shower. Do not use towel bars as grab bars. Use non-skid mats or decals in the tub or shower. If you need to sit down in the shower, use a plastic, non-slip stool. Keep the floor dry. Clean up any water that spills on the floor as soon as it happens. Remove soap buildup in the tub or shower regularly. Attach bath mats securely with double-sided non-slip rug tape. Do not have throw rugs and other things on the floor that can make you trip. What can I do in the bedroom? Use night lights. Make sure that you have a light by your bed that is easy to reach. Do not use any sheets or blankets that are too big for your bed. They should not hang down onto the floor. Have a firm chair that has side arms. You can use this for support while you get dressed. Do not have throw rugs and other things on the floor that can make you trip. What can I do in the kitchen? Clean up any spills right away. Avoid walking on wet floors. Keep items that you use a lot in easy-to-reach places. If you need to reach something above you, use a strong step stool that has a grab bar. Keep electrical cords out of the way. Do not use floor polish or wax that makes floors slippery. If you must use wax, use non-skid floor wax. Do not have throw rugs and other things on the floor that can make you trip. What can I  do with my stairs? Do not leave any items on the stairs. Make sure that there are handrails on both sides of the stairs and use them. Fix handrails that are broken or loose. Make sure that handrails are as long as the stairways. Check any carpeting to make sure that it is firmly attached to the stairs. Fix any carpet that is loose or worn. Avoid having throw rugs at the top or bottom of the stairs. If you do have throw rugs, attach them to the floor with carpet tape. Make sure that you have a light switch at the top of the stairs and the bottom of the stairs. If you do not have them, ask someone to  add them for you. What else can I do to help prevent falls? Wear shoes that: Do not have high heels. Have rubber bottoms. Are comfortable and fit you well. Are closed at the toe. Do not wear sandals. If you use a stepladder: Make sure that it is fully opened. Do not climb a closed stepladder. Make sure that both sides of the stepladder are locked into place. Ask someone to hold it for you, if possible. Clearly mark and make sure that you can see: Any grab bars or handrails. First and last steps. Where the edge of each step is. Use tools that help you move around (mobility aids) if they are needed. These include: Canes. Walkers. Scooters. Crutches. Turn on the lights when you go into a dark area. Replace any light bulbs as soon as they burn out. Set up your furniture so you have a clear path. Avoid moving your furniture around. If any of your floors are uneven, fix them. If there are any pets around you, be aware of where they are. Review your medicines with your doctor. Some medicines can make you feel dizzy. This can increase your chance of falling. Ask your doctor what other things that you can do to help prevent falls. This information is not intended to replace advice given to you by your health care provider. Make sure you discuss any questions you have with your health care  provider. Document Released: 09/12/2009 Document Revised: 04/23/2016 Document Reviewed: 12/21/2014 Elsevier Interactive Patient Education  2017 [employer].

## 2023-03-30 NOTE — Progress Notes (Signed)
I connected with  Vanessa Jones on 03/30/23 by a audio enabled telemedicine application and verified that I am speaking with the correct person using two identifiers.  Patient Location: Home  Provider Location: Office/Clinic  I discussed the limitations of evaluation and management by telemedicine. The patient expressed understanding and agreed to proceed.   Subjective:   Vanessa Jones is a 82 y.o. female who presents for Medicare Annual (Subsequent) preventive examination.  Review of Systems     Cardiac Risk Factors include: advanced age (>1men, >65 women);hypertension     Objective:    Today's Vitals   03/30/23 1110  Weight: 185 lb (83.9 kg)  Height: 5\' 7"  (1.702 m)   Body mass index is 28.98 kg/m.     03/30/2023   10:58 AM 03/16/2022   11:35 AM 03/10/2021    9:20 AM 10/06/2019   11:18 AM 08/17/2017   12:06 PM 02/02/2017   11:02 AM 08/08/2015   12:41 PM  Advanced Directives  Does Patient Have a Medical Advance Directive? No No No No No No No  Would patient like information on creating a medical advance directive? No - Patient declined  No - Patient declined No - Patient declined No - Patient declined No - Patient declined No - patient declined information    Current Medications (verified) Outpatient Encounter Medications as of 03/30/2023  Medication Sig   acetaminophen (TYLENOL) 500 MG tablet Take 500 mg by mouth every 6 (six) hours as needed.   aspirin EC 81 MG tablet Take 81 mg by mouth daily. Swallow whole.   atorvastatin (LIPITOR) 10 MG tablet Take 1 tablet (10 mg total) by mouth every Monday, Wednesday, and Friday.   diphenhydrAMINE (BENADRYL) 25 MG tablet Take 25 mg by mouth every 6 (six) hours as needed.   EPINEPHrine 0.3 mg/0.3 mL IJ SOAJ injection SMARTSIG:0.3 Milligram(s) IM Once PRN   glucosamine-chondroitin 500-400 MG tablet Take 1 tablet by mouth 3 (three) times daily.   hydrochlorothiazide (HYDRODIURIL) 25 MG tablet TAKE 1/2 TABLET(12.5 MG) BY MOUTH DAILY    lisinopril (ZESTRIL) 10 MG tablet TAKE 1 TABLET(10 MG) BY MOUTH DAILY   loratadine (CLARITIN) 10 MG tablet Take 10 mg by mouth daily. Uses as needed.   Multiple Vitamins-Minerals (MULTIPLE VITAMINS/WOMENS PO) Take by mouth.   VITAMIN D, CHOLECALCIFEROL, PO Take 400 Units by mouth.   azelastine (ASTELIN) 0.1 % nasal spray Use 2 sprays in each nostril 1-2 times daily. (Patient not taking: Reported on 03/30/2023)   oxymetazoline (AFRIN) 0.05 % nasal spray Place 1 spray into both nostrils 2 (two) times daily as needed for congestion.  (Patient not taking: Reported on 04/23/2022)   sodium chloride (OCEAN) 0.65 % SOLN nasal spray Place 1 spray into both nostrils as needed for congestion. (Patient not taking: Reported on 03/30/2023)   No facility-administered encounter medications on file as of 03/30/2023.    Allergies (verified) Cephalexin   History: Past Medical History:  Diagnosis Date   Allergic dermatitis    Allergic rhinitis    Complex tear of medial meniscus of left knee    Diverticulitis    Eczema    GERD (gastroesophageal reflux disease)    History of chickenpox    Hyperlipidemia    Hypertension    Osteoarthritis    LEFT KNEE   Seasonal allergies    Serrated adenoma of colon    Tubular adenoma of colon    Past Surgical History:  Procedure Laterality Date   CHOLECYSTECTOMY  COLONOSCOPY     COLONOSCOPY WITH PROPOFOL N/A 08/17/2017   Procedure: COLONOSCOPY WITH PROPOFOL;  Surgeon: Christena Deem, MD;  Location[facility] ENDOSCOPY;  Service: Endoscopy;  Laterality: N/A;   COLONOSCOPY WITH PROPOFOL N/A 03/10/2021   Procedure: COLONOSCOPY WITH PROPOFOL;  Surgeon: Regis Bill, MD;  Location: [facility] ENDOSCOPY;  Service: Endoscopy;  Laterality: N/A;   HEMORRHOID SURGERY     KNEE ARTHROSCOPY WITH MENISCAL REPAIR Left 08/08/2015   Procedure: KNEE ARTHROSCOPY WITH medial and lateral menisectomy;  Surgeon: Christena Flake, MD;  Location: [facility] ORS;  Service: Orthopedics;  Laterality:  Left;   TUBAL LIGATION     Family History  Problem Relation Age of Onset   Hypertension Mother    Cancer Mother        colon, age 72   Diabetes Father    Emphysema Father    Cancer Sister        breast   Asthma Sister    Cancer Brother        bladder   Coronary artery disease Brother    Coronary artery disease Maternal Grandmother    Heart disease Paternal Grandmother        enlarged heart   Stroke Paternal Grandfather    Allergic rhinitis Neg Hx    Angioedema Neg Hx    Atopy Neg Hx    Eczema Neg Hx    Immunodeficiency Neg Hx    Social History   Socioeconomic History   Marital status: Married    Spouse name: Not on file   Number of children: 2   Years of education: Not on file   Highest education level: Not on file  Occupational History   Occupation: Librarian, academic, retired  Tobacco Use   Smoking status: Never   Smokeless tobacco: Never  Vaping Use   Vaping Use: Never used  Substance and Sexual Activity   Alcohol use: Yes    Comment: Occ glass of wine   Drug use: No   Sexual activity: Not on file  Other Topics Concern   Not on file  Social History Narrative   Retired Librarian, academic   Married, 2 healthy boys    Regular exercise- yes, walking 3 days a week.   2006 treadmill stress test, low risk.      Moderate diet   No HCOPA, no living will, info given (reviewed 2015)   Social Determinants of Health   Financial Resource Strain: Low Risk  (03/30/2023)   Overall Financial Resource Strain (CARDIA)    Difficulty of Paying Living Expenses: Not hard at all  Food Insecurity: No Food Insecurity (03/30/2023)   Hunger Vital Sign    Worried About Running Out of Food in the Last Year: Never true    Ran Out of Food in the Last Year: Never true  Transportation Needs: No Transportation Needs (03/30/2023)   PRAPARE - Administrator, Civil Service (Medical): No    Lack of Transportation (Non-Medical): No  Physical Activity: Insufficiently Active  (03/30/2023)   Exercise Vital Sign    Days of Exercise per Week: 3 days    Minutes of Exercise per Session: 30 min  Stress: No Stress Concern Present (03/30/2023)   [employer] of Occupational Health - Occupational Stress Questionnaire    Feeling of Stress : Not at all  Social Connections: Moderately Integrated (03/30/2023)   Social Connection and Isolation Panel [NHANES]    Frequency of Communication with Friends and Family: Twice a week  Frequency of Social Gatherings with Friends and Family: More than three times a week    Attends Religious Services: More than 4 times per year    Active Member of [employer] or Organizations: No    Attends Engineer, structural: Never    Marital Status: Married    Tobacco Counseling Counseling given: Not Answered   Clinical Intake:  Pre-visit preparation completed: Yes  Pain : 0-10     Nutritional Risks: None Diabetes: No  How often do you need to have someone help you when you read instructions, pamphlets, or other written materials from your doctor or pharmacy?: 1 - Never  Diabetic?No  Interpreter Needed?: No  Comments: Lives with husband Information entered by :: P. Foy   Activities of Daily Living    03/30/2023   11:01 AM  In your present state of health, do you have any difficulty performing the following activities:  Hearing? 0  Comment has hearing aids  Vision? 0  Difficulty concentrating or making decisions? 0  Walking or climbing stairs? 0  Dressing or bathing? 0  Doing errands, shopping? 0  Preparing Food and eating ? N  Using the Toilet? N  In the past six months, have you accidently leaked urine? N  Do you have problems with loss of bowel control? N  Managing your Medications? N  Managing your Finances? N  Housekeeping or managing your Housekeeping? N    Patient Care Team: Excell Seltzer, MD as PCP - General Phil Dopp, [facility] as Pharmacist (Pharmacist)  Indicate any recent Medical Services you  may have received from other than [facility] providers in the past year (date may be approximate).     Assessment:   This is a routine wellness examination for Thresa.  Hearing/Vision screen Hearing Screening - Comments:: Wear hearing aids, has some issues with background noice with hearing aids Vision Screening - Comments:: Reading glasses- no eye doctor  Dietary issues and exercise activities discussed: Current Exercise Habits: Structured exercise class, Type of exercise: walking, Time (Minutes): 30, Frequency (Times/Week): 2, Weekly Exercise (Minutes/Week): 60   Goals Addressed               This Visit's Progress     Patient Stated (pt-stated)        Lose weight       Depression Screen    03/30/2023   11:01 AM 03/30/2023   10:57 AM 03/16/2022   11:36 AM 01/21/2021   10:56 AM 10/06/2019   11:19 AM 05/10/2018   11:17 AM 02/02/2017   10:16 AM  PHQ 2/9 Scores  PHQ - 2 Score 0 0 0 1 0 0 0  PHQ- 9 Score     0      Fall Risk    03/30/2023   11:01 AM 03/16/2022   11:36 AM 01/21/2021   10:55 AM 10/06/2019   11:19 AM 07/01/2018    4:25 PM  Fall Risk   Falls in the past year? 0 0 0 0 No  Comment     Emmi Telephone Survey: data to providers prior to load  Number falls in past yr: 0 0 0 0   Injury with Fall? 0 0 0 0   Risk for fall due to : No Fall Risks Medication side effect No Fall Risks Medication side effect   Follow up Falls prevention discussed;Education provided Falls evaluation completed;Education provided;Falls prevention discussed Falls evaluation completed Falls evaluation completed;Falls prevention discussed     FALL RISK PREVENTION PERTAINING  TO THE HOME:  Any stairs in or around the home? Yes  If so, are there any without handrails? Yes  Home free of loose throw rugs in walkways, pet beds, electrical cords, etc? Yes  Adequate lighting in your home to reduce risk of falls? Yes   ASSISTIVE DEVICES UTILIZED TO PREVENT FALLS:  Life alert? No  Use of a cane, walker or  w/c? No  Grab bars in the bathroom? Yes  Shower chair or bench in shower? No Elevated toilet seat or a handicapped toilet? Yes    Cognitive Function:    10/06/2019   11:25 AM  MMSE - Mini Mental State Exam  Orientation to time 5  Orientation to Place 5  Registration 3  Attention/ Calculation 5  Recall 3  Language- repeat 1        03/30/2023   11:15 AM 03/30/2023   11:07 AM 03/16/2022   11:38 AM  6CIT Screen  What Year? 0 points 0 points 0 points  What month? 3 points 0 points 0 points  What time? 0 points 0 points 0 points  Count back from 20 0 points 0 points 0 points  Months in reverse 2 points 2 points 0 points  Repeat phrase 2 points 0 points 0 points  Total Score 7 points 2 points 0 points    Immunizations Immunization History  Administered Date(s) Administered   Fluad Quad(high Dose 65+) 10/10/2019, 01/21/2021   Influenza Split 12/14/2011, 08/04/2012   Influenza, High Dose Seasonal PF 09/22/2017   Influenza,inj,Quad PF,6+ Mos 10/05/2014   Influenza-Unspecified 07/31/2013, 08/30/2018   PFIZER(Purple Top)SARS-COV-2 Vaccination 12/15/2019, 01/05/2020, 10/31/2020   Pneumococcal Conjugate-13 10/05/2014   Pneumococcal Polysaccharide-23 12/28/2008   Td 10/30/2008   Zoster, Live 11/30/2012    TDAP status: Due, Education has been provided regarding the importance of this vaccine. Advised may receive this vaccine at local pharmacy or Health Dept. Aware to provide a copy of the vaccination record if obtained from local pharmacy or Health Dept. Verbalized acceptance and understanding.  Flu Vaccine status: Up to date  Pneumococcal vaccine status: Due, Education has been provided regarding the importance of this vaccine. Advised may receive this vaccine at local pharmacy or Health Dept. Aware to provide a copy of the vaccination record if obtained from local pharmacy or Health Dept. Verbalized acceptance and understanding.  Covid-19 vaccine status: Information provided on  how to obtain vaccines.   Qualifies for Shingles Vaccine? No   Zostavax completed No   Shingrix Completed?: No.    Education has been provided regarding the importance of this vaccine. Patient has been advised to call insurance company to determine out of pocket expense if they have not yet received this vaccine. Advised may also receive vaccine at local pharmacy or Health Dept. Verbalized acceptance and understanding.  Screening Tests Health Maintenance  Topic Date Due   Zoster Vaccines- Shingrix (1 of 2) Never done   DTaP/Tdap/Td (2 - Tdap) 10/30/2018   DEXA SCAN  02/02/2021   COVID-19 Vaccine (4 - 2023-24 season) 07/31/2022   MAMMOGRAM  01/16/2023   INFLUENZA VACCINE  07/01/2023   Medicare Annual Wellness (AWV)  03/29/2024   Pneumonia Vaccine 81+ Years old  Completed   Hepatitis C Screening  Completed   HPV VACCINES  Aged Out   COLONOSCOPY (Pts 45-70yrs Insurance coverage will need to be confirmed)  Discontinued    Health Maintenance  Health Maintenance Due  Topic Date Due   Zoster Vaccines- Shingrix (1 of 2) Never done  DTaP/Tdap/Td (2 - Tdap) 10/30/2018   DEXA SCAN  02/02/2021   COVID-19 Vaccine (4 - 2023-24 season) 07/31/2022   MAMMOGRAM  01/16/2023    Colorectal cancer screening: No longer required.   Mammogram status: Completed 01/16/22. Repeat every year patient will call to schedule  Bone Density status: Completed 02/03/2016. Results reflect: Bone density results: NORMAL. Repeat every 5 years. Order already placed  Lung Cancer Screening: (Low Dose CT Chest recommended if Age 42-80 years, 30 pack-year currently smoking OR have quit w/in 15years.) does not qualify.   Lung Cancer Screening Referral: No  Additional Screening:  Hepatitis C Screening: does not qualify; Completed 08/28/22  Vision Screening: Recommended annual ophthalmology exams for early detection of glaucoma and other disorders of the eye. Is the patient up to date with their annual eye exam?  No   Who is the provider or what is the name of the office in which the patient attends annual eye exams? Patient needs an eye care provider If pt is not established with a provider, would they like to be referred to a provider to establish care? Yes .   Dental Screening: Recommended annual dental exams for proper oral hygiene  Community Resource Referral / Chronic Care Management: CRR required this visit?  No   CCM required this visit?  No      Plan:     I have personally reviewed and noted the following in the patient's chart:   Medical and social history Use of alcohol, tobacco or illicit drugs  Current medications and supplements including opioid prescriptions. Patient is not currently taking opioid prescriptions. Functional ability and status Nutritional status Physical activity Advanced directives List of other physicians Hospitalizations, surgeries, and ER visits in previous 12 months Vitals Screenings to include cognitive, depression, and falls Referrals and appointments  In addition, I have reviewed and discussed with patient certain preventive protocols, quality metrics, and best practice recommendations. A written personalized care plan for preventive services as well as general preventive health recommendations were provided to patient.     Maryan Puls, LPN   1/61/0960   Nurse Notes: Concerned about eye sight , due to almost failing eye exam for drivers license.

## 2023-03-31 ENCOUNTER — Telehealth: Payer: Self-pay | Admitting: Family Medicine

## 2023-03-31 NOTE — Telephone Encounter (Signed)
Bone Density orders faxed to [facility] at [phone].

## 2023-03-31 NOTE — Telephone Encounter (Signed)
Pt called in requesting orders be sent in for her to get a bone density test done . Please advise [phone]

## 2023-04-02 DIAGNOSIS — M85852 Other specified disorders of bone density and structure, left thigh: Secondary | ICD-10-CM | POA: Diagnosis not present

## 2023-04-02 DIAGNOSIS — M8588 Other specified disorders of bone density and structure, other site: Secondary | ICD-10-CM | POA: Diagnosis not present

## 2023-04-02 DIAGNOSIS — Z1231 Encounter for screening mammogram for malignant neoplasm of breast: Secondary | ICD-10-CM | POA: Diagnosis not present

## 2023-04-05 ENCOUNTER — Encounter: Payer: Self-pay | Admitting: Family Medicine

## 2023-04-05 LAB — HM MAMMOGRAPHY

## 2023-04-05 LAB — HM DEXA SCAN

## 2023-04-06 ENCOUNTER — Encounter: Payer: Self-pay | Admitting: Family Medicine

## 2023-04-06 ENCOUNTER — Ambulatory Visit (INDEPENDENT_AMBULATORY_CARE_PROVIDER_SITE_OTHER): Payer: Medicare Other | Admitting: Family Medicine

## 2023-04-06 ENCOUNTER — Other Ambulatory Visit (INDEPENDENT_AMBULATORY_CARE_PROVIDER_SITE_OTHER): Payer: Medicare Other

## 2023-04-06 VITALS — BP 118/78 | HR 59 | Temp 97.1°F | Ht 67.0 in | Wt 178.0 lb

## 2023-04-06 DIAGNOSIS — R300 Dysuria: Secondary | ICD-10-CM | POA: Diagnosis not present

## 2023-04-06 DIAGNOSIS — R7303 Prediabetes: Secondary | ICD-10-CM | POA: Diagnosis not present

## 2023-04-06 DIAGNOSIS — E559 Vitamin D deficiency, unspecified: Secondary | ICD-10-CM

## 2023-04-06 DIAGNOSIS — E7800 Pure hypercholesterolemia, unspecified: Secondary | ICD-10-CM | POA: Diagnosis not present

## 2023-04-06 LAB — VITAMIN D 25 HYDROXY (VIT D DEFICIENCY, FRACTURES): VITD: 39.46 ng/mL (ref 30.00–100.00)

## 2023-04-06 LAB — POC URINALSYSI DIPSTICK (AUTOMATED)
Bilirubin, UA: NEGATIVE
Blood, UA: 10
Glucose, UA: NEGATIVE
Ketones, UA: NEGATIVE
Nitrite, UA: NEGATIVE
Protein, UA: NEGATIVE
Spec Grav, UA: 1.015 (ref 1.010–1.025)
Urobilinogen, UA: 0.2 E.U./dL
pH, UA: 6 (ref 5.0–8.0)

## 2023-04-06 LAB — COMPREHENSIVE METABOLIC PANEL
ALT: 10 U/L (ref 0–35)
AST: 17 U/L (ref 0–37)
Albumin: 4.2 g/dL (ref 3.5–5.2)
Alkaline Phosphatase: 79 U/L (ref 39–117)
BUN: 15 mg/dL (ref 6–23)
CO2: 27 mEq/L (ref 19–32)
Calcium: 9.5 mg/dL (ref 8.4–10.5)
Chloride: 99 mEq/L (ref 96–112)
Creatinine, Ser: 0.91 mg/dL (ref 0.40–1.20)
GFR: 59.85 mL/min — ABNORMAL LOW (ref >60.00)
Glucose, Bld: 96 mg/dL (ref 70–99)
Potassium: 3.9 mEq/L (ref 3.5–5.1)
Sodium: 134 mEq/L — ABNORMAL LOW (ref 135–145)
Total Bilirubin: 0.7 mg/dL (ref 0.2–1.2)
Total Protein: 7.1 g/dL (ref 6.0–8.3)

## 2023-04-06 LAB — LIPID PANEL
Cholesterol: 152 mg/dL (ref 0–200)
HDL: 62.1 mg/dL (ref >39.00)
LDL Cholesterol: 75 mg/dL (ref 0–99)
NonHDL: 89.95
Total CHOL/HDL Ratio: 2
Triglycerides: 74 mg/dL (ref 0.0–149.0)
VLDL: 14.8 mg/dL (ref 0.0–40.0)

## 2023-04-06 LAB — HEMOGLOBIN A1C: Hgb A1c MFr Bld: 6.1 % (ref 4.6–6.5)

## 2023-04-06 MED ORDER — SULFAMETHOXAZOLE-TRIMETHOPRIM 800-160 MG PO TABS: 1.0000 | ORAL_TABLET | Freq: Two times a day (BID) | ORAL | 0 refills | Status: DC

## 2023-04-06 NOTE — Progress Notes (Signed)
No critical labs need to be addressed urgently. We will discuss labs in detail at upcoming office visit.   

## 2023-04-06 NOTE — Patient Instructions (Signed)
Drink plenty of water and start the antibiotics today.  We'll contact you with your lab report.  Take care.   

## 2023-04-06 NOTE — Assessment & Plan Note (Signed)
Presumed cystitis.  Septra, ucx and u/a, PO fluids, update [place] as needed.  She agrees with plan. Okay for outpatient f/u.

## 2023-04-06 NOTE — Progress Notes (Signed)
dysuria: yes, at the start of the stream, frequency.   duration of symptoms: a few days.   abdominal pain: some occ lower abd pressure.   fevers: no back pain: occ lower back pain vomiting:no She cut out caffeine in the meantime w/o relief.   No recent UTI.    Meds, vitals, and allergies reviewed.   Per HPI unless specifically indicated in ROS section   GEN: nad, alert and oriented HEENT: ncat NECK: supple CV: rrr.  PULM: ctab, no inc wob ABD: soft, +bs, suprapubic area not tender EXT: no edema SKIN: well perfused.

## 2023-04-13 ENCOUNTER — Encounter: Payer: Self-pay | Admitting: *Deleted

## 2023-04-13 ENCOUNTER — Ambulatory Visit (INDEPENDENT_AMBULATORY_CARE_PROVIDER_SITE_OTHER): Payer: Medicare Other | Admitting: Family Medicine

## 2023-04-13 ENCOUNTER — Encounter: Payer: Self-pay | Admitting: Family Medicine

## 2023-04-13 VITALS — BP 118/68 | HR 86 | Temp 98.1°F | Ht 64.5 in | Wt 180.2 lb

## 2023-04-13 DIAGNOSIS — N3001 Acute cystitis with hematuria: Secondary | ICD-10-CM | POA: Diagnosis not present

## 2023-04-13 DIAGNOSIS — L819 Disorder of pigmentation, unspecified: Secondary | ICD-10-CM

## 2023-04-13 DIAGNOSIS — I10 Essential (primary) hypertension: Secondary | ICD-10-CM

## 2023-04-13 DIAGNOSIS — R7303 Prediabetes: Secondary | ICD-10-CM | POA: Diagnosis not present

## 2023-04-13 DIAGNOSIS — E7800 Pure hypercholesterolemia, unspecified: Secondary | ICD-10-CM

## 2023-04-13 DIAGNOSIS — E559 Vitamin D deficiency, unspecified: Secondary | ICD-10-CM

## 2023-04-13 DIAGNOSIS — H539 Unspecified visual disturbance: Secondary | ICD-10-CM | POA: Diagnosis not present

## 2023-04-13 MED ORDER — NITROFURANTOIN MONOHYD MACRO 100 MG PO CAPS
100.0000 mg | ORAL_CAPSULE | Freq: Two times a day (BID) | ORAL | 0 refills | Status: DC
Start: 2023-04-13 — End: 2024-03-23

## 2023-04-13 NOTE — Assessment & Plan Note (Signed)
Partially treated. No culture sent.. Will treat with nitrofurantoin.

## 2023-04-13 NOTE — Progress Notes (Signed)
Patient ID: Vanessa Jones, female    DOB: 19-Nov-1941, 82 y.o.   MRN: [id number]  This visit was conducted in person.  Blood pressure 132/78, pulse 94, temperature 98 F (36.7 C), resp. rate 16, height 5\' 4"  (1.626 m), weight 181 lb 7 oz (82.3 kg), SpO2 94 %.   CC:  Annual 2   Subjective:   HPI: The patient presents for  review of chronic health problems. He/She also has the following acute concerns today:   Continued dysuria, feels like not emptying complete,  urinary urgency.  Improved but but not resolved after completing Bactrim x 3 days.  The patient saw a LPN or RN for medicare wellness visit. 03/30/23  Prevention and wellness was reviewed in detail. Note reviewed and important notes copied below.    Hypertension:    Well controlled  BP Readings from Last 3 Encounters:  04/13/23 118/68  04/06/23 118/78  04/23/22 132/78  At goal on HCTZ and lisinopril  Using medication without problems or lightheadedness:  none Chest pain with exertion: none Edema: none Short of breath: stable overall Average home BPs: Other issues: some    Prediabetes  stable control Lab Results  Component Value Date   HGBA1C 6.1 04/06/2023    Elevated Cholesterol:   Improved control on  atorvastatin 10 mg  every other day  Lab Results  Component Value Date   CHOL 152 04/06/2023   HDL 62.10 04/06/2023   LDLCALC 75 04/06/2023   TRIG 74.0 04/06/2023   CHOLHDL 2 04/06/2023  Using medications without problems: Muscle aches:   YES with statin Diet compliance: moderate, some  Exercise:  rare given knee issue Other complaints: Wt Readings from Last 3 Encounters:  04/13/23 180 lb 4 oz (81.8 kg)  04/06/23 178 lb (80.7 kg)  03/30/23 185 lb (83.9 kg)      Vit D at goal on supplement D3        Relevant past medical, surgical, family and social history reviewed and updated as indicated. Interim medical history since our last visit reviewed. Allergies and medications reviewed and  updated. Outpatient Medications Prior to Visit  Medication Sig Dispense Refill   acetaminophen (TYLENOL) 500 MG tablet Take 500 mg by mouth every 6 (six) hours as needed.     atorvastatin (LIPITOR) 10 MG tablet Take 1 tablet (10 mg total) by mouth every Monday, Wednesday, and Friday. 36 tablet 3   diphenhydrAMINE (BENADRYL) 25 MG tablet Take 25 mg by mouth every 6 (six) hours as needed.     EPINEPHrine 0.3 mg/0.3 mL IJ SOAJ injection SMARTSIG:0.3 Milligram(s) IM Once PRN     glucosamine-chondroitin 500-400 MG tablet Take 1 tablet by mouth 3 (three) times daily.     hydrochlorothiazide (HYDRODIURIL) 25 MG tablet TAKE 1/2 TABLET(12.5 MG) BY MOUTH DAILY 45 tablet 0   lisinopril (ZESTRIL) 10 MG tablet TAKE 1 TABLET(10 MG) BY MOUTH DAILY 90 tablet 1   Multiple Vitamins-Minerals (MULTIPLE VITAMINS/WOMENS PO) Take by mouth.     VITAMIN D, CHOLECALCIFEROL, PO Take 400 Units by mouth.     azelastine (ASTELIN) 0.1 % nasal spray Use 2 sprays in each nostril 1-2 times daily. 30 mL 5   loratadine (CLARITIN) 10 MG tablet Take 10 mg by mouth daily. Uses as needed.     oxymetazoline (AFRIN) 0.05 % nasal spray Place 1 spray into both nostrils 2 (two) times daily as needed for congestion.     sodium chloride (OCEAN) 0.65 % SOLN nasal spray Place  1 spray into both nostrils as needed for congestion.     sulfamethoxazole-trimethoprim (BACTRIM DS) 800-160 MG tablet Take 1 tablet by mouth 2 (two) times daily. 6 tablet 0   No facility-administered medications prior to visit.     Per HPI unless specifically indicated in ROS section below Review of Systems  Constitutional:  Negative for fatigue and fever.  HENT:  Negative for congestion.   Eyes:  Negative for pain.  Respiratory:  Negative for cough and shortness of breath.   Cardiovascular:  Negative for chest pain, palpitations and leg swelling.  Gastrointestinal:  Negative for abdominal pain.  Genitourinary:  Negative for dysuria and vaginal bleeding.   Musculoskeletal:  Negative for back pain.  Neurological:  Negative for syncope, light-headedness and headaches.  Psychiatric/Behavioral:  Negative for dysphoric mood.    Objective:  BP 118/68 (BP Location: Left Arm, Patient Position: Sitting, Cuff Size: Large)   Pulse 86   Temp 98.1 F (36.7 C) (Temporal)   Ht 5' 4.5" (1.638 m)   Wt 180 lb 4 oz (81.8 kg)   SpO2 96%   BMI 30.46 kg/m   Wt Readings from Last 3 Encounters:  04/13/23 180 lb 4 oz (81.8 kg)  04/06/23 178 lb (80.7 kg)  03/30/23 185 lb (83.9 kg)      Physical Exam Vitals and nursing note reviewed.  Constitutional:      General: She is not in acute distress.    Appearance: Normal appearance. She is well-developed. She is not ill-appearing or toxic-appearing.  HENT:     Head: Normocephalic.     Right Ear: Hearing, tympanic membrane, ear canal and external ear normal.     Left Ear: Hearing, tympanic membrane, ear canal and external ear normal.     Nose: Nose normal.  Eyes:     General: Lids are normal. Lids are everted, no foreign bodies appreciated.     Conjunctiva/sclera: Conjunctivae normal.     Pupils: Pupils are equal, round, and reactive to light.  Neck:     Thyroid: No thyroid mass or thyromegaly.     Vascular: No carotid bruit.     Trachea: Trachea normal.  Cardiovascular:     Rate and Rhythm: Normal rate and regular rhythm.     Heart sounds: Normal heart sounds, S1 normal and S2 normal. No murmur heard.    No gallop.  Pulmonary:     Effort: Pulmonary effort is normal. No respiratory distress.     Breath sounds: Normal breath sounds. No wheezing, rhonchi or rales.  Abdominal:     General: Bowel sounds are normal. There is no distension or abdominal bruit.     Palpations: Abdomen is soft. There is no fluid wave or mass.     Tenderness: There is no abdominal tenderness. There is no guarding or rebound.     Hernia: No hernia is present.  Musculoskeletal:     Cervical back: Normal range of motion and neck  supple.  Lymphadenopathy:     Cervical: No cervical adenopathy.  Skin:    General: Skin is warm and dry.     Findings: No rash.  Neurological:     Mental Status: She is alert.     Cranial Nerves: No cranial nerve deficit.     Sensory: No sensory deficit.  Psychiatric:        Mood and Affect: Mood is not anxious or depressed.        Speech: Speech normal.  Behavior: Behavior normal. Behavior is cooperative.        Judgment: Judgment normal.       Results for orders placed or performed in visit on 04/06/23  POCT Urinalysis Dipstick (Automated)  Result Value Ref Range   Color, UA yellow    Clarity, UA clear    Glucose, UA Negative Negative   Bilirubin, UA neg    Ketones, UA neg    Spec Grav, UA 1.015 1.010 - 1.025   Blood, UA 10 Ery/uL    pH, UA 6.0 5.0 - 8.0   Protein, UA Negative Negative   Urobilinogen, UA 0.2 0.2 or 1.0 E.U./dL   Nitrite, UA neg    Leukocytes, UA Moderate (2+) (A) Negative     COVID 19 screen:  No recent travel or known exposure to COVID19 The patient denies respiratory symptoms of COVID 19 at this time. The importance of social distancing was discussed today.   Assessment and Plan The patient's preventative maintenance and recommended screening tests for an annual wellness exam were reviewed in full today. Brought up to date unless services declined.  Counselled on the importance of diet, exercise, and its role in overall health and mortality. The patient's FH and SH was reviewed, including their home life, tobacco status, and drug and alcohol status.   Colon: 02/2021 Dr. Marva Panda, [facility],no further indicated  Family history of colon cancer in mother .   PAP/DVE: PAP not indicated due to age. Has decided no DVE, no symptoms, no family history of uterine and ovarian cancer.   DEXA: 2017 normal improved, 03/2023 normal, repeat in 5 years Mammogram: Last 2024 sister with breast cancer. Repeat yearly.  Vaccines:  Consider Td and  shingles vaccine     Uptodate with PNA, S/P COVID x 3 , consider booster  Hep C: done   Problem List Items Addressed This Visit     Acute cystitis with hematuria     Partially treated. No culture sent.. Will treat with nitrofurantoin.      Essential hypertension, benign - Primary (Chronic)    Stable, chronic.  Continue current medication.   HCTZ 25 mg daily Lisinopril 10 mg daily      Prediabetes    Chronic stable control      Pure hypercholesterolemia (Chronic)     Chronic,well controlled  Atorvastatin 10 mg QOD      Vitamin D deficiency    Stable, chronic.  Continue current medication.         Other Visit Diagnoses     Change in vision       Relevant Orders   Ambulatory referral to Ophthalmology   Changing pigmented skin lesion       Relevant Orders   Ambulatory referral to Dermatology      Meds ordered this encounter  Medications   nitrofurantoin, macrocrystal-monohydrate, (MACROBID) 100 MG capsule    Sig: Take 1 capsule (100 mg total) by mouth 2 (two) times daily.    Dispense:  10 capsule    Refill:  0   Orders Placed This Encounter  Procedures   Ambulatory referral to Ophthalmology    Referral Priority:   Routine    Referral Type:   Consultation    Referral Reason:   Specialty Services Required    Requested Specialty:   Ophthalmology    Number of Visits Requested:   1   Ambulatory referral to Dermatology    Referral Priority:   Routine    Referral Type:   Consultation  Referral Reason:   Specialty Services Required    Requested Specialty:   Dermatology    Number of Visits Requested:   1      Kerby Nora, MD

## 2023-04-13 NOTE — Patient Instructions (Addendum)
Referral place to eye MD and  dermatologist.

## 2023-04-13 NOTE — Assessment & Plan Note (Signed)
Chronic stable control 

## 2023-04-13 NOTE — Assessment & Plan Note (Signed)
Stable, chronic.  Continue current medication.   HCTZ 25 mg daily Lisinopril 10 mg daily 

## 2023-04-13 NOTE — Assessment & Plan Note (Signed)
Stable, chronic.  Continue current medication.    

## 2023-04-13 NOTE — Assessment & Plan Note (Addendum)
Chronic,well controlled  Atorvastatin 10 mg QOD

## 2023-04-15 ENCOUNTER — Encounter: Payer: Self-pay | Admitting: *Deleted

## 2023-05-20 DIAGNOSIS — H2513 Age-related nuclear cataract, bilateral: Secondary | ICD-10-CM | POA: Diagnosis not present

## 2023-05-20 DIAGNOSIS — H43813 Vitreous degeneration, bilateral: Secondary | ICD-10-CM | POA: Diagnosis not present

## 2023-06-02 DIAGNOSIS — H2512 Age-related nuclear cataract, left eye: Secondary | ICD-10-CM | POA: Diagnosis not present

## 2023-06-07 ENCOUNTER — Encounter: Payer: Self-pay | Admitting: Ophthalmology

## 2023-06-10 ENCOUNTER — Encounter: Payer: Self-pay | Admitting: Ophthalmology

## 2023-06-10 ENCOUNTER — Other Ambulatory Visit: Payer: Self-pay | Admitting: Family Medicine

## 2023-06-10 NOTE — Anesthesia Preprocedure Evaluation (Addendum)
Anesthesia Evaluation    Airway Mallampati: III  TM Distance: >3 FB Neck ROM: Full    Dental no notable dental hx. (+) Implants Multiple implants and a few crowns:   Pulmonary    Pulmonary exam normal breath sounds clear to auscultation       Cardiovascular hypertension, Normal cardiovascular exam Rhythm:Regular Rate:Normal     Neuro/Psych  Neuromuscular disease    GI/Hepatic ,GERD  ,,  Endo/Other    Renal/GU      Musculoskeletal  (+) Arthritis ,    Abdominal   Peds  Hematology   Anesthesia Other Findings Hypertension  Hyperlipidemia Allergic dermatitis  Allergic rhinitis Diverticulitis  GERD (gastroesophageal reflux disease) Osteoarthritis  Complex tear of medial meniscus of left knee Seasonal allergies Eczema  Serrated adenoma of colon Tubular adenoma of colon  Bilateral sensorineural hearing loss   Reproductive/Obstetrics                              Anesthesia Physical Anesthesia Plan  ASA: 3  Anesthesia Plan: MAC   Post-op Pain Management:    Induction: Intravenous  PONV Risk Score and Plan:   Airway Management Planned: Natural Airway and Nasal Cannula  Additional Equipment:   Intra-op Plan:   Post-operative Plan:   Informed Consent: I have reviewed the patients History and Physical, chart, labs and discussed the procedure including the risks, benefits and alternatives for the proposed anesthesia with the patient or authorized representative who has indicated his/her understanding and acceptance.     Dental Advisory Given  Plan Discussed with: Anesthesiologist, CRNA and Surgeon  Anesthesia Plan Comments: (Patient consented for risks of anesthesia including but not limited to:  - adverse reactions to medications - damage to eyes, teeth, lips or other oral mucosa - nerve damage due to positioning  - sore throat or hoarseness - Damage to heart, brain, nerves,  lungs, other parts of body or loss of life  Patient voiced understanding.)         Anesthesia Quick Evaluation

## 2023-06-10 NOTE — Discharge Instructions (Signed)

## 2023-06-14 ENCOUNTER — Ambulatory Visit: Payer: Medicare Other | Admitting: Anesthesiology

## 2023-06-14 ENCOUNTER — Encounter
Admission: RE | Disposition: A | Discharge status: Home / Self Care | Payer: Self-pay | Source: Ambulatory Visit | Attending: Ophthalmology

## 2023-06-14 ENCOUNTER — Other Ambulatory Visit: Payer: Self-pay

## 2023-06-14 ENCOUNTER — Encounter: Payer: Self-pay | Admitting: Ophthalmology

## 2023-06-14 ENCOUNTER — Ambulatory Visit
Admission: RE | Admit: 2023-06-14 | Discharge: 2023-06-14 | Disposition: A | Discharge status: Home / Self Care | Payer: Medicare Other | Source: Ambulatory Visit | Attending: Ophthalmology | Admitting: Ophthalmology

## 2023-06-14 DIAGNOSIS — Z09 Encounter for follow-up examination after completed treatment for conditions other than malignant neoplasm: Secondary | ICD-10-CM | POA: Diagnosis not present

## 2023-06-14 DIAGNOSIS — E785 Hyperlipidemia, unspecified: Secondary | ICD-10-CM | POA: Diagnosis not present

## 2023-06-14 DIAGNOSIS — Z86010 Personal history of colonic polyps: Secondary | ICD-10-CM | POA: Diagnosis not present

## 2023-06-14 DIAGNOSIS — M1990 Unspecified osteoarthritis, unspecified site: Secondary | ICD-10-CM | POA: Diagnosis not present

## 2023-06-14 DIAGNOSIS — H2512 Age-related nuclear cataract, left eye: Secondary | ICD-10-CM | POA: Diagnosis not present

## 2023-06-14 DIAGNOSIS — K219 Gastro-esophageal reflux disease without esophagitis: Secondary | ICD-10-CM | POA: Diagnosis not present

## 2023-06-14 DIAGNOSIS — H903 Sensorineural hearing loss, bilateral: Secondary | ICD-10-CM | POA: Diagnosis not present

## 2023-06-14 DIAGNOSIS — I10 Essential (primary) hypertension: Secondary | ICD-10-CM | POA: Diagnosis not present

## 2023-06-14 DIAGNOSIS — H269 Unspecified cataract: Secondary | ICD-10-CM | POA: Diagnosis not present

## 2023-06-14 HISTORY — PX: CATARACT EXTRACTION W/PHACO: SHX586

## 2023-06-14 HISTORY — DX: Sensorineural hearing loss, bilateral: H90.3

## 2023-06-14 SURGERY — PHACOEMULSIFICATION, CATARACT, WITH IOL INSERTION
Anesthesia: Monitor Anesthesia Care | Site: Eye | Laterality: Left

## 2023-06-14 MED ORDER — LACTATED RINGERS IV SOLN: INTRAVENOUS | Status: DC

## 2023-06-14 MED ORDER — SIGHTPATH DOSE#1 BSS IO SOLN
INTRAOCULAR | Status: DC | PRN
  Administered 2023-06-14: 1 mL

## 2023-06-14 MED ORDER — SIGHTPATH DOSE#1 NA HYALUR & NA CHOND-NA HYALUR IO KIT
PACK | INTRAOCULAR | Status: DC | PRN
  Administered 2023-06-14: 1 via OPHTHALMIC

## 2023-06-14 MED ORDER — BRIMONIDINE TARTRATE-TIMOLOL 0.2-0.5 % OP SOLN
OPHTHALMIC | Status: DC | PRN
  Administered 2023-06-14: 1 [drp] via OPHTHALMIC

## 2023-06-14 MED ORDER — SIGHTPATH DOSE#1 BSS IO SOLN
INTRAOCULAR | Status: DC | PRN
  Administered 2023-06-14: 15 mL

## 2023-06-14 MED ORDER — TETRACAINE HCL 0.5 % OP SOLN
1.0000 [drp] | OPHTHALMIC | Status: DC | PRN
  Administered 2023-06-14 (×3): 1 [drp] via OPHTHALMIC

## 2023-06-14 MED ORDER — SIGHTPATH DOSE#1 BSS IO SOLN
INTRAOCULAR | Status: DC | PRN
  Administered 2023-06-14: 75 mL via OPHTHALMIC

## 2023-06-14 MED ORDER — MOXIFLOXACIN HCL 0.5 % OP SOLN
OPHTHALMIC | Status: DC | PRN
  Administered 2023-06-14: .2 mL via OPHTHALMIC

## 2023-06-14 MED ORDER — ARMC OPHTHALMIC DILATING DROPS
1.0000 | OPHTHALMIC | Status: DC | PRN
  Administered 2023-06-14 (×3): 1 via OPHTHALMIC

## 2023-06-14 SURGICAL SUPPLY — 9 items
CATARACT SUITE SIGHTPATH (MISCELLANEOUS) ×1 IMPLANT
FEE CATARACT SUITE SIGHTPATH (MISCELLANEOUS) ×1 IMPLANT
GLOVE SRG 8 PF TXTR STRL LF DI (GLOVE) ×1 IMPLANT
GLOVE SURG ENC TEXT LTX SZ7.5 (GLOVE) ×1 IMPLANT
GLOVE SURG UNDER POLY LF SZ8 (GLOVE) ×1
LENS IOL TECNIS EYHANCE 23.0 (Intraocular Lens) IMPLANT
NDL FILTER BLUNT 18X1 1/2 (NEEDLE) ×1 IMPLANT
NEEDLE FILTER BLUNT 18X1 1/2 (NEEDLE) ×1 IMPLANT
SYR 3ML LL SCALE MARK (SYRINGE) ×1 IMPLANT

## 2023-06-14 NOTE — Transfer of Care (Signed)
Immediate Anesthesia Transfer of Care Note  Patient: Vanessa Jones  Procedure(s) Performed: CATARACT EXTRACTION PHACO AND INTRAOCULAR LENS PLACEMENT (IOC) LEFT 11.95 01:12.0 (Left: Eye)  Patient Location: PACU  Anesthesia Type: MAC  Level of Consciousness: awake, alert  and patient cooperative  Airway and Oxygen Therapy: Patient Spontanous Breathing and Patient connected to supplemental oxygen  Post-op Assessment: Post-op Vital signs reviewed, Patient's Cardiovascular Status Stable, Respiratory Function Stable, Patent Airway and No signs of Nausea or vomiting  Post-op Vital Signs: Reviewed and stable  Complications: No notable events documented.

## 2023-06-14 NOTE — Op Note (Signed)
OPERATIVE NOTE  Vanessa Jones [facility] [id number] 06/14/2023   PREOPERATIVE DIAGNOSIS:  Nuclear sclerotic cataract left eye. H25.12   POSTOPERATIVE DIAGNOSIS:    Nuclear sclerotic cataract left eye.     PROCEDURE:  Phacoemusification with posterior chamber intraocular lens placement of the left eye  Ultrasound time: Procedure(s): CATARACT EXTRACTION PHACO AND INTRAOCULAR LENS PLACEMENT (IOC) LEFT 11.95 01:12.0 (Left)  LENS:   Implant Name Type Inv. Item Serial No. Manufacturer Lot No. LRB No. Used Action  LENS IOL TECNIS EYHANCE 23.0 - [id number] Intraocular Lens LENS IOL TECNIS EYHANCE 23.0 [id number] [employer]  Left 1 Implanted      SURGEON:  Deirdre Evener, MD   ANESTHESIA:  Topical with tetracaine drops and 2% Xylocaine jelly, augmented with 1% preservative-free intracameral lidocaine.    COMPLICATIONS:  None.   DESCRIPTION OF PROCEDURE:  The patient was identified in the holding room and transported to the operating room and placed in the supine position under the operating microscope.  The left eye was identified as the operative eye and it was prepped and draped in the usual sterile ophthalmic fashion.   A 1 millimeter clear-corneal paracentesis was made at the 1:30 position.  0.5 ml of preservative-free 1% lidocaine was injected into the anterior chamber.  The anterior chamber was filled with Viscoat viscoelastic.  A 2.4 millimeter keratome was used to make a near-clear corneal incision at the 10:30 position.  .  A curvilinear capsulorrhexis was made with a cystotome and capsulorrhexis forceps.  Balanced salt solution was used to hydrodissect and hydrodelineate the nucleus.   Phacoemulsification was then used in stop and chop fashion to remove the lens nucleus and epinucleus.  The remaining cortex was then removed using the irrigation and aspiration handpiece. Provisc was then placed into the capsular bag to distend it for lens placement.  A lens was then injected into the  capsular bag.  The remaining viscoelastic was aspirated.   Wounds were hydrated with balanced salt solution.  The anterior chamber was inflated to a physiologic pressure with balanced salt solution.  No wound leaks were noted.Vigamox 0.2 ml of a 1mg  per ml solution was injected into the anterior chamber for a dose of 0.2 mg of intracameral antibiotic at the completion of the case.    Timolol and Brimonidine drops were applied to the eye.  The patient was taken to the recovery room in stable condition without complications of anesthesia or surgery.  Vanessa Jones 06/14/2023, 12:48 PM

## 2023-06-14 NOTE — H&P (Signed)
[facility]   Primary Care Physician:  Excell Seltzer, MD Ophthalmologist: Dr. Lockie Mola  Pre-Procedure History & Physical: HPI:  Vanessa Jones is a 82 y.o. female here for ophthalmic surgery.   Past Medical History:  Diagnosis Date   Allergic dermatitis    Allergic rhinitis    Bilateral sensorineural hearing loss    Complex tear of medial meniscus of left knee    Diverticulitis    Eczema    GERD (gastroesophageal reflux disease)    History of chickenpox    Hyperlipidemia    Hypertension    Osteoarthritis    LEFT KNEE   Seasonal allergies    Serrated adenoma of colon    Tubular adenoma of colon     Past Surgical History:  Procedure Laterality Date   CHOLECYSTECTOMY     COLONOSCOPY     COLONOSCOPY WITH PROPOFOL N/A 08/17/2017   Procedure: COLONOSCOPY WITH PROPOFOL;  Surgeon: Christena Deem, MD;  Location: [facility] ENDOSCOPY;  Service: Endoscopy;  Laterality: N/A;   COLONOSCOPY WITH PROPOFOL N/A 03/10/2021   Procedure: COLONOSCOPY WITH PROPOFOL;  Surgeon: Regis Bill, MD;  Location: [facility] ENDOSCOPY;  Service: Endoscopy;  Laterality: N/A;   HEMORRHOID SURGERY     KNEE ARTHROSCOPY WITH MENISCAL REPAIR Left 08/08/2015   Procedure: KNEE ARTHROSCOPY WITH medial and lateral menisectomy;  Surgeon: Christena Flake, MD;  Location: [facility] ORS;  Service: Orthopedics;  Laterality: Left;   TUBAL LIGATION      Prior to Admission medications   Medication Sig Start Date End Date Taking? Authorizing Provider  acetaminophen (TYLENOL) 500 MG tablet Take 500 mg by mouth every 6 (six) hours as needed.   Yes [provider]  atorvastatin (LIPITOR) 10 MG tablet Take 1 tablet (10 mg total) by mouth every Monday, Wednesday, and Friday. 09/02/22  Yes Bedsole, Amy E, MD  diphenhydrAMINE (BENADRYL) 25 MG tablet Take 25 mg by mouth every 6 (six) hours as needed.   Yes [provider]  EPINEPHrine 0.3 mg/0.3 mL IJ SOAJ injection SMARTSIG:0.3 Milligram(s) IM Once  PRN 02/16/20  Yes [provider]  glucosamine-chondroitin 500-400 MG tablet Take 1 tablet by mouth 3 (three) times daily.   Yes [provider]  lisinopril (ZESTRIL) 10 MG tablet TAKE 1 TABLET(10 MG) BY MOUTH DAILY 08/23/22  Yes Bedsole, Amy E, MD  Multiple Vitamins-Minerals (MULTIPLE VITAMINS/WOMENS PO) Take by mouth.   Yes [provider]  VITAMIN D, CHOLECALCIFEROL, PO Take 400 Units by mouth.   Yes [provider]  hydrochlorothiazide (HYDRODIURIL) 25 MG tablet TAKE 1/2 TABLET(12.5 MG) BY MOUTH DAILY 06/10/23   Bedsole, Amy E, MD  nitrofurantoin, macrocrystal-monohydrate, (MACROBID) 100 MG capsule Take 1 capsule (100 mg total) by mouth 2 (two) times daily. Patient not taking: Reported on 06/07/2023 04/13/23   Excell Seltzer, MD    Allergies as of 05/24/2023 - Review Complete 04/13/2023  Allergen Reaction Noted   Cephalexin Shortness Of Breath and Itching 02/15/2020    Family History  Problem Relation Age of Onset   Hypertension Mother    Cancer Mother        colon, age 93   Diabetes Father    Emphysema Father    Cancer Sister        breast   Asthma Sister    Cancer Brother        bladder   Coronary artery disease Brother    Coronary artery disease Maternal Grandmother    Heart disease Paternal Grandmother  enlarged heart   Stroke Paternal Grandfather    Allergic rhinitis Neg Hx    Angioedema Neg Hx    Atopy Neg Hx    Eczema Neg Hx    Immunodeficiency Neg Hx     Social History   Socioeconomic History   Marital status: Married    Spouse name: Not on file   Number of children: 2   Years of education: Not on file   Highest education level: Not on file  Occupational History   Occupation: Librarian, academic, retired  Tobacco Use   Smoking status: Never   Smokeless tobacco: Never  Vaping Use   Vaping status: Never Used  Substance and Sexual Activity   Alcohol use: Yes    Comment: Occ glass of wine   Drug use: No   Sexual  activity: Not on file  Other Topics Concern   Not on file  Social History Narrative   Retired Librarian, academic   Married, 2 healthy boys    Regular exercise- yes, walking 3 days a week.   2006 treadmill stress test, low risk.      Moderate diet   No HCOPA, no living will, info given (reviewed 2015)   Social Determinants of Health   Financial Resource Strain: Low Risk  (03/30/2023)   Overall Financial Resource Strain (CARDIA)    Difficulty of Paying Living Expenses: Not hard at all  Food Insecurity: No Food Insecurity (03/30/2023)   Hunger Vital Sign    Worried About Running Out of Food in the Last Year: Never true    Ran Out of Food in the Last Year: Never true  Transportation Needs: No Transportation Needs (03/30/2023)   PRAPARE - Administrator, Civil Service (Medical): No    Lack of Transportation (Non-Medical): No  Physical Activity: Insufficiently Active (03/30/2023)   Exercise Vital Sign    Days of Exercise per Week: 3 days    Minutes of Exercise per Session: 30 min  Stress: No Stress Concern Present (03/30/2023)   [employer] of Occupational Health - Occupational Stress Questionnaire    Feeling of Stress : Not at all  Social Connections: Moderately Integrated (03/30/2023)   Social Connection and Isolation Panel [NHANES]    Frequency of Communication with Friends and Family: Twice a week    Frequency of Social Gatherings with Friends and Family: More than three times a week    Attends Religious Services: More than 4 times per year    Active Member of [employer] or Organizations: No    Attends Banker Meetings: Never    Marital Status: Married  Catering manager Violence: Not At Risk (10/06/2019)   Humiliation, Afraid, Rape, and Kick questionnaire    Fear of Current or Ex-Partner: No    Emotionally Abused: No    Physically Abused: No    Sexually Abused: No    Review of Systems: See HPI, otherwise negative ROS  Physical Exam: BP (!) 159/96    Pulse 85   Temp (!) 97.3 F (36.3 C) (Temporal)   Resp 14   Ht 5' 4.5" (1.638 m)   Wt 83 kg   SpO2 96%   BMI 30.93 kg/m  General:   Alert,  pleasant and cooperative in NAD Head:  Normocephalic and atraumatic. Lungs:  Clear to auscultation.    Heart:  Regular rate and rhythm.   Impression/Plan: Vanessa Jones is here for ophthalmic surgery.  Risks, benefits, limitations, and alternatives regarding ophthalmic surgery have been reviewed  with the patient.  Questions have been answered.  All parties agreeable.   Lockie Mola, MD  06/14/2023, 11:19 AM

## 2023-06-15 ENCOUNTER — Encounter: Payer: Self-pay | Admitting: Ophthalmology

## 2023-06-17 NOTE — Anesthesia Postprocedure Evaluation (Signed)
Anesthesia Post Note  Patient: Vanessa Jones  Procedure(s) Performed: CATARACT EXTRACTION PHACO AND INTRAOCULAR LENS PLACEMENT (IOC) LEFT 11.95 01:12.0 (Left: Eye)  Patient location during evaluation: PACU Anesthesia Type: MAC Level of consciousness: awake and alert Pain management: pain level controlled Vital Signs Assessment: post-procedure vital signs reviewed and stable Respiratory status: spontaneous breathing, nonlabored ventilation, respiratory function stable and patient connected to nasal cannula oxygen Cardiovascular status: stable and blood pressure returned to baseline Postop Assessment: no apparent nausea or vomiting Anesthetic complications: no   No notable events documented.   Last Vitals:  Vitals:   06/14/23 1250 06/14/23 1255  BP: 131/73   Pulse: 72 71  Resp: 18 15  Temp: 36.5 C (!) 36.2 C  SpO2: 95% 96%    Last Pain:  Vitals:   06/15/23 1030  TempSrc:   PainSc: 0-No pain                 Nuha Degner C Minola Guin

## 2023-06-22 DIAGNOSIS — H2511 Age-related nuclear cataract, right eye: Secondary | ICD-10-CM | POA: Diagnosis not present

## 2023-06-30 ENCOUNTER — Encounter: Payer: Self-pay | Admitting: Ophthalmology

## 2023-07-05 NOTE — Discharge Instructions (Signed)

## 2023-07-07 ENCOUNTER — Ambulatory Visit: Payer: Medicare Other | Admitting: Anesthesiology

## 2023-07-07 ENCOUNTER — Encounter: Payer: Self-pay | Admitting: Ophthalmology

## 2023-07-07 ENCOUNTER — Ambulatory Visit
Admission: RE | Admit: 2023-07-07 | Discharge: 2023-07-07 | Disposition: A | Discharge status: Home / Self Care | Payer: Medicare Other | Source: Ambulatory Visit | Attending: Ophthalmology | Admitting: Ophthalmology

## 2023-07-07 ENCOUNTER — Other Ambulatory Visit: Payer: Self-pay

## 2023-07-07 ENCOUNTER — Encounter
Admission: RE | Disposition: A | Discharge status: Home / Self Care | Payer: Self-pay | Source: Ambulatory Visit | Attending: Ophthalmology

## 2023-07-07 DIAGNOSIS — I10 Essential (primary) hypertension: Secondary | ICD-10-CM | POA: Diagnosis not present

## 2023-07-07 DIAGNOSIS — H2511 Age-related nuclear cataract, right eye: Secondary | ICD-10-CM | POA: Diagnosis not present

## 2023-07-07 HISTORY — PX: CATARACT EXTRACTION W/PHACO: SHX586

## 2023-07-07 SURGERY — PHACOEMULSIFICATION, CATARACT, WITH IOL INSERTION
Anesthesia: Monitor Anesthesia Care | Laterality: Right

## 2023-07-07 MED ORDER — SIGHTPATH DOSE#1 BSS IO SOLN
INTRAOCULAR | Status: DC | PRN
  Administered 2023-07-07: 58 mL via OPHTHALMIC

## 2023-07-07 MED ORDER — ARMC OPHTHALMIC DILATING DROPS
1.0000 | OPHTHALMIC | Status: DC | PRN
  Administered 2023-07-07 (×3): 1 via OPHTHALMIC

## 2023-07-07 MED ORDER — FENTANYL CITRATE (PF) 100 MCG/2ML IJ SOLN
INTRAMUSCULAR | Status: DC | PRN
  Administered 2023-07-07 (×2): 50 ug via INTRAVENOUS

## 2023-07-07 MED ORDER — SIGHTPATH DOSE#1 BSS IO SOLN
INTRAOCULAR | Status: DC | PRN
  Administered 2023-07-07: 2 mL

## 2023-07-07 MED ORDER — SIGHTPATH DOSE#1 BSS IO SOLN
INTRAOCULAR | Status: DC | PRN
  Administered 2023-07-07: 15 mL via INTRAOCULAR

## 2023-07-07 MED ORDER — TETRACAINE HCL 0.5 % OP SOLN
1.0000 [drp] | OPHTHALMIC | Status: DC | PRN
  Administered 2023-07-07 (×3): 1 [drp] via OPHTHALMIC

## 2023-07-07 MED ORDER — MOXIFLOXACIN HCL 0.5 % OP SOLN
OPHTHALMIC | Status: DC | PRN
  Administered 2023-07-07: .2 mL via OPHTHALMIC

## 2023-07-07 MED ORDER — BRIMONIDINE TARTRATE-TIMOLOL 0.2-0.5 % OP SOLN
OPHTHALMIC | Status: DC | PRN
  Administered 2023-07-07: 1 [drp] via OPHTHALMIC

## 2023-07-07 MED ORDER — SIGHTPATH DOSE#1 NA HYALUR & NA CHOND-NA HYALUR IO KIT
PACK | INTRAOCULAR | Status: DC | PRN
  Administered 2023-07-07: 1 via OPHTHALMIC

## 2023-07-07 SURGICAL SUPPLY — 11 items
CANNULA ANT/CHMB 27G (MISCELLANEOUS) IMPLANT
CANNULA ANT/CHMB 27GA (MISCELLANEOUS) ×1 IMPLANT
CATARACT SUITE SIGHTPATH (MISCELLANEOUS) ×1 IMPLANT
FEE CATARACT SUITE SIGHTPATH (MISCELLANEOUS) ×1 IMPLANT
GLOVE SRG 8 PF TXTR STRL LF DI (GLOVE) ×1 IMPLANT
GLOVE SURG ENC TEXT LTX SZ7.5 (GLOVE) ×1 IMPLANT
GLOVE SURG UNDER POLY LF SZ8 (GLOVE) ×1
LENS IOL TECNIS EYHANCE 23.0 (Intraocular Lens) IMPLANT
NDL FILTER BLUNT 18X1 1/2 (NEEDLE) ×1 IMPLANT
NEEDLE FILTER BLUNT 18X1 1/2 (NEEDLE) ×1 IMPLANT
SYR 3ML LL SCALE MARK (SYRINGE) ×1 IMPLANT

## 2023-07-07 NOTE — Anesthesia Preprocedure Evaluation (Signed)
Anesthesia Evaluation    History of Anesthesia Complications Negative for: history of anesthetic complications  Airway Mallampati: III  TM Distance: >3 FB Neck ROM: Full    Dental no notable dental hx. (+) Implants Multiple implants and a few crowns:   Pulmonary    Pulmonary exam normal breath sounds clear to auscultation       Cardiovascular hypertension, Normal cardiovascular exam Rhythm:Regular Rate:Normal     Neuro/Psych  Neuromuscular disease    GI/Hepatic ,GERD  ,,  Endo/Other    Renal/GU      Musculoskeletal  (+) Arthritis ,    Abdominal   Peds  Hematology   Anesthesia Other Findings Hypertension  Hyperlipidemia Allergic dermatitis  Allergic rhinitis Diverticulitis  GERD (gastroesophageal reflux disease) Osteoarthritis  Complex tear of medial meniscus of left knee Seasonal allergies Eczema  Serrated adenoma of colon Tubular adenoma of colon  Bilateral sensorineural hearing loss   Reproductive/Obstetrics                              Anesthesia Physical Anesthesia Plan  ASA: 3  Anesthesia Plan: MAC   Post-op Pain Management:    Induction: Intravenous  PONV Risk Score and Plan:   Airway Management Planned: Natural Airway and Nasal Cannula  Additional Equipment:   Intra-op Plan:   Post-operative Plan:   Informed Consent: I have reviewed the patients History and Physical, chart, labs and discussed the procedure including the risks, benefits and alternatives for the proposed anesthesia with the patient or authorized representative who has indicated his/her understanding and acceptance.     Dental Advisory Given  Plan Discussed with: Anesthesiologist, CRNA and Surgeon  Anesthesia Plan Comments: (Patient consented for risks of anesthesia including but not limited to:  - adverse reactions to medications - damage to eyes, teeth, lips or other oral mucosa - nerve  damage due to positioning  - sore throat or hoarseness - Damage to heart, brain, nerves, lungs, other parts of body or loss of life  Patient voiced understanding.)         Anesthesia Quick Evaluation

## 2023-07-07 NOTE — Transfer of Care (Signed)
Immediate Anesthesia Transfer of Care Note  Patient: Vanessa Jones  Procedure(s) Performed: CATARACT EXTRACTION PHACO AND INTRAOCULAR LENS PLACEMENT (IOC) RIGHT 9.59 01:08.0 (Right)  Patient Location: PACU  Anesthesia Type: MAC  Level of Consciousness: awake, alert  and patient cooperative  Airway and Oxygen Therapy: Patient Spontanous Breathing and Patient connected to supplemental oxygen  Post-op Assessment: Post-op Vital signs reviewed, Patient's Cardiovascular Status Stable, Respiratory Function Stable, Patent Airway and No signs of Nausea or vomiting  Post-op Vital Signs: Reviewed and stable  Complications: No notable events documented.

## 2023-07-07 NOTE — H&P (Signed)
[facility]   Primary Care Physician:  Excell Seltzer, MD Ophthalmologist: Dr. Lockie Mola  Pre-Procedure History & Physical: HPI:  Vanessa Jones is a 82 y.o. female here for ophthalmic surgery.   Past Medical History:  Diagnosis Date   Allergic dermatitis    Allergic rhinitis    Bilateral sensorineural hearing loss    Complex tear of medial meniscus of left knee    Diverticulitis    Eczema    GERD (gastroesophageal reflux disease)    History of chickenpox    Hyperlipidemia    Hypertension    Osteoarthritis    LEFT KNEE   Seasonal allergies    Serrated adenoma of colon    Tubular adenoma of colon     Past Surgical History:  Procedure Laterality Date   CATARACT EXTRACTION W/PHACO Left 06/14/2023   Procedure: CATARACT EXTRACTION PHACO AND INTRAOCULAR LENS PLACEMENT (IOC) LEFT 11.95 01:12.0;  Surgeon: Lockie Mola, MD;  Location: [facility];  Service: Ophthalmology;  Laterality: Left;   CHOLECYSTECTOMY     COLONOSCOPY     COLONOSCOPY WITH PROPOFOL N/A 08/17/2017   Procedure: COLONOSCOPY WITH PROPOFOL;  Surgeon: Christena Deem, MD;  Location: [facility];  Service: Endoscopy;  Laterality: N/A;   COLONOSCOPY WITH PROPOFOL N/A 03/10/2021   Procedure: COLONOSCOPY WITH PROPOFOL;  Surgeon: Regis Bill, MD;  Location: [facility];  Service: Endoscopy;  Laterality: N/A;   HEMORRHOID SURGERY     KNEE ARTHROSCOPY WITH MENISCAL REPAIR Left 08/08/2015   Procedure: KNEE ARTHROSCOPY WITH medial and lateral menisectomy;  Surgeon: Christena Flake, MD;  Location: [facility];  Service: Orthopedics;  Laterality: Left;   TUBAL LIGATION      Prior to Admission medications   Medication Sig Start Date End Date Taking? Authorizing Provider  acetaminophen (TYLENOL) 500 MG tablet Take 500 mg by mouth every 6 (six) hours as needed.   Yes [provider]  atorvastatin (LIPITOR) 10 MG tablet Take 1 tablet (10 mg total) by mouth every Monday,  Wednesday, and Friday. 09/02/22  Yes Bedsole, Amy E, MD  diphenhydrAMINE (BENADRYL) 25 MG tablet Take 25 mg by mouth every 6 (six) hours as needed.   Yes [provider]  hydrochlorothiazide (HYDRODIURIL) 25 MG tablet TAKE 1/2 TABLET(12.5 MG) BY MOUTH DAILY 06/10/23  Yes Bedsole, Amy E, MD  lisinopril (ZESTRIL) 10 MG tablet TAKE 1 TABLET(10 MG) BY MOUTH DAILY 08/23/22  Yes Bedsole, Amy E, MD  Multiple Vitamins-Minerals (MULTIPLE VITAMINS/WOMENS PO) Take by mouth.   Yes [provider]  VITAMIN D, CHOLECALCIFEROL, PO Take 400 Units by mouth.   Yes [provider]  EPINEPHrine 0.3 mg/0.3 mL IJ SOAJ injection SMARTSIG:0.3 Milligram(s) IM Once PRN Patient not taking: Reported on 06/30/2023 02/16/20   [provider]  glucosamine-chondroitin 500-400 MG tablet Take 1 tablet by mouth 3 (three) times daily. Patient not taking: Reported on 06/30/2023    [provider]  nitrofurantoin, macrocrystal-monohydrate, (MACROBID) 100 MG capsule Take 1 capsule (100 mg total) by mouth 2 (two) times daily. Patient not taking: Reported on 07/07/2023 04/13/23   Excell Seltzer, MD    Allergies as of 05/24/2023 - Review Complete 04/13/2023  Allergen Reaction Noted   Cephalexin Shortness Of Breath and Itching 02/15/2020    Family History  Problem Relation Age of Onset   Hypertension Mother    Cancer Mother        colon, age 25   Diabetes Father    Emphysema Father  Cancer Sister        breast   Asthma Sister    Cancer Brother        bladder   Coronary artery disease Brother    Coronary artery disease Maternal Grandmother    Heart disease Paternal Grandmother        enlarged heart   Stroke Paternal Grandfather    Allergic rhinitis Neg Hx    Angioedema Neg Hx    Atopy Neg Hx    Eczema Neg Hx    Immunodeficiency Neg Hx     Social History   Socioeconomic History   Marital status: Married    Spouse name: Not on file   Number of children: 2   Years of  education: Not on file   Highest education level: Not on file  Occupational History   Occupation: Librarian, academic, retired  Tobacco Use   Smoking status: Never   Smokeless tobacco: Never  Vaping Use   Vaping status: Never Used  Substance and Sexual Activity   Alcohol use: Yes    Comment: Occ glass of wine   Drug use: No   Sexual activity: Not on file  Other Topics Concern   Not on file  Social History Narrative   Retired Librarian, academic   Married, 2 healthy boys    Regular exercise- yes, walking 3 days a week.   2006 treadmill stress test, low risk.      Moderate diet   No HCOPA, no living will, info given (reviewed 2015)   Social Determinants of Health   Financial Resource Strain: Low Risk  (03/30/2023)   Overall Financial Resource Strain (CARDIA)    Difficulty of Paying Living Expenses: Not hard at all  Food Insecurity: No Food Insecurity (03/30/2023)   Hunger Vital Sign    Worried About Running Out of Food in the Last Year: Never true    Ran Out of Food in the Last Year: Never true  Transportation Needs: No Transportation Needs (03/30/2023)   PRAPARE - Administrator, Civil Service (Medical): No    Lack of Transportation (Non-Medical): No  Physical Activity: Insufficiently Active (03/30/2023)   Exercise Vital Sign    Days of Exercise per Week: 3 days    Minutes of Exercise per Session: 30 min  Stress: No Stress Concern Present (03/30/2023)   [employer] of Occupational Health - Occupational Stress Questionnaire    Feeling of Stress : Not at all  Social Connections: Moderately Integrated (03/30/2023)   Social Connection and Isolation Panel [NHANES]    Frequency of Communication with Friends and Family: Twice a week    Frequency of Social Gatherings with Friends and Family: More than three times a week    Attends Religious Services: More than 4 times per year    Active Member of [employer] or Organizations: No    Attends Banker Meetings: Never     Marital Status: Married  Catering manager Violence: Not At Risk (10/06/2019)   Humiliation, Afraid, Rape, and Kick questionnaire    Fear of Current or Ex-Partner: No    Emotionally Abused: No    Physically Abused: No    Sexually Abused: No    Review of Systems: See HPI, otherwise negative ROS  Physical Exam: BP (!) 158/85   Temp (!) 97.1 F (36.2 C) (Temporal)   Ht 5' 4.49" (1.638 m)   Wt 83.2 kg   SpO2 94%   BMI 31.02 kg/m  General:   Alert,  pleasant and cooperative in NAD Head:  Normocephalic and atraumatic. Lungs:  Clear to auscultation.    Heart:  Regular rate and rhythm.   Impression/Plan: Vanessa Jones is here for ophthalmic surgery.  Risks, benefits, limitations, and alternatives regarding ophthalmic surgery have been reviewed with the patient.  Questions have been answered.  All parties agreeable.   Lockie Mola, MD  07/07/2023, 10:52 AM

## 2023-07-07 NOTE — Anesthesia Postprocedure Evaluation (Signed)
Anesthesia Post Note  Patient: Vanessa Jones  Procedure(s) Performed: CATARACT EXTRACTION PHACO AND INTRAOCULAR LENS PLACEMENT (IOC) RIGHT 9.59 01:08.0 (Right)  Patient location during evaluation: PACU Anesthesia Type: MAC Level of consciousness: awake and alert Pain management: pain level controlled Vital Signs Assessment: post-procedure vital signs reviewed and stable Respiratory status: spontaneous breathing, nonlabored ventilation, respiratory function stable and patient connected to nasal cannula oxygen Cardiovascular status: stable and blood pressure returned to baseline Postop Assessment: no apparent nausea or vomiting Anesthetic complications: no   No notable events documented.   Last Vitals:  Vitals:   07/07/23 1202 07/07/23 1207  BP: 132/75 126/76  Pulse: 71 69  Resp: 18 15  Temp: (!) 36.1 C (!) 36.3 C  SpO2: 98% 97%    Last Pain:  Vitals:   07/07/23 1207  TempSrc: Temporal  PainSc: 0-No pain                 Louie Boston

## 2023-07-07 NOTE — Op Note (Signed)
LOCATION:  [facility]   PREOPERATIVE DIAGNOSIS:    Nuclear sclerotic cataract right eye. H25.11   POSTOPERATIVE DIAGNOSIS:  Nuclear sclerotic cataract right eye.     PROCEDURE:  Phacoemusification with posterior chamber intraocular lens placement of the right eye   ULTRASOUND TIME: Procedure(s): CATARACT EXTRACTION PHACO AND INTRAOCULAR LENS PLACEMENT (IOC) RIGHT 9.59 01:08.0 (Right)  LENS:   Implant Name Type Inv. Item Serial No. Manufacturer Lot No. LRB No. Used Action  LENS IOL TECNIS EYHANCE 23.0 - [id number] Intraocular Lens LENS IOL TECNIS EYHANCE 23.0 [id number] [employer]  Right 1 Implanted         SURGEON:  Deirdre Evener, MD   ANESTHESIA:  Topical with tetracaine drops and 2% Xylocaine jelly, augmented with 1% preservative-free intracameral lidocaine.    COMPLICATIONS:  None.   DESCRIPTION OF PROCEDURE:  The patient was identified in the holding room and transported to the operating room and placed in the supine position under the operating microscope.  The right eye was identified as the operative eye and it was prepped and draped in the usual sterile ophthalmic fashion.   A 1 millimeter clear-corneal paracentesis was made at the 12:00 position.  0.5 ml of preservative-free 1% lidocaine was injected into the anterior chamber. The anterior chamber was filled with Viscoat viscoelastic.  A 2.4 millimeter keratome was used to make a near-clear corneal incision at the 9:00 position.  A curvilinear capsulorrhexis was made with a cystotome and capsulorrhexis forceps.  Balanced salt solution was used to hydrodissect and hydrodelineate the nucleus.   Phacoemulsification was then used in stop and chop fashion to remove the lens nucleus and epinucleus.  The remaining cortex was then removed using the irrigation and aspiration handpiece. Provisc was then placed into the capsular bag to distend it for lens placement.  A lens was then injected into the capsular bag.  The  remaining viscoelastic was aspirated.   Wounds were hydrated with balanced salt solution.  The anterior chamber was inflated to a physiologic pressure with balanced salt solution.  No wound leaks were noted. Vigamox 0.2 ml of a 1mg  per ml solution was injected into the anterior chamber for a dose of 0.2 mg of intracameral antibiotic at the completion of the case.   Timolol and Brimonidine drops were applied to the eye.  The patient was taken to the recovery room in stable condition without complications of anesthesia or surgery.   Vanessa Jones 07/07/2023, 12:01 PM

## 2023-07-08 ENCOUNTER — Encounter: Payer: Self-pay | Admitting: Ophthalmology

## 2023-07-27 DIAGNOSIS — Z961 Presence of intraocular lens: Secondary | ICD-10-CM | POA: Diagnosis not present

## 2023-08-08 ENCOUNTER — Other Ambulatory Visit: Payer: Self-pay | Admitting: Family Medicine

## 2023-08-16 ENCOUNTER — Other Ambulatory Visit: Payer: Self-pay | Admitting: Family Medicine

## 2023-09-06 DIAGNOSIS — L2989 Other pruritus: Secondary | ICD-10-CM | POA: Diagnosis not present

## 2023-09-06 DIAGNOSIS — D2262 Melanocytic nevi of left upper limb, including shoulder: Secondary | ICD-10-CM | POA: Diagnosis not present

## 2023-09-06 DIAGNOSIS — L821 Other seborrheic keratosis: Secondary | ICD-10-CM | POA: Diagnosis not present

## 2023-09-06 DIAGNOSIS — L538 Other specified erythematous conditions: Secondary | ICD-10-CM | POA: Diagnosis not present

## 2023-09-06 DIAGNOSIS — D2261 Melanocytic nevi of right upper limb, including shoulder: Secondary | ICD-10-CM | POA: Diagnosis not present

## 2023-09-06 DIAGNOSIS — L820 Inflamed seborrheic keratosis: Secondary | ICD-10-CM | POA: Diagnosis not present

## 2023-09-06 DIAGNOSIS — D225 Melanocytic nevi of trunk: Secondary | ICD-10-CM | POA: Diagnosis not present

## 2023-10-22 DIAGNOSIS — Z23 Encounter for immunization: Secondary | ICD-10-CM | POA: Diagnosis not present

## 2023-12-17 ENCOUNTER — Other Ambulatory Visit: Payer: Self-pay | Admitting: Family Medicine

## 2023-12-17 MED ORDER — ATORVASTATIN CALCIUM 10 MG PO TABS: 10.0000 mg | ORAL_TABLET | ORAL | 5 refills | Status: DC

## 2024-01-13 DIAGNOSIS — Z961 Presence of intraocular lens: Secondary | ICD-10-CM | POA: Diagnosis not present

## 2024-01-13 DIAGNOSIS — H43813 Vitreous degeneration, bilateral: Secondary | ICD-10-CM | POA: Diagnosis not present

## 2024-01-13 DIAGNOSIS — H04123 Dry eye syndrome of bilateral lacrimal glands: Secondary | ICD-10-CM | POA: Diagnosis not present

## 2024-03-23 ENCOUNTER — Encounter: Payer: Self-pay | Admitting: Family Medicine

## 2024-03-23 ENCOUNTER — Ambulatory Visit (INDEPENDENT_AMBULATORY_CARE_PROVIDER_SITE_OTHER): Admitting: Family Medicine

## 2024-03-23 VITALS — BP 112/64 | HR 102 | Temp 98.0°F | Ht 64.5 in | Wt 185.0 lb

## 2024-03-23 DIAGNOSIS — R350 Frequency of micturition: Secondary | ICD-10-CM | POA: Insufficient documentation

## 2024-03-23 LAB — POC URINALSYSI DIPSTICK (AUTOMATED)
Bilirubin, UA: NEGATIVE
Glucose, UA: NEGATIVE
Ketones, UA: NEGATIVE
Nitrite, UA: NEGATIVE
Protein, UA: POSITIVE — AB
Spec Grav, UA: 1.015 (ref 1.010–1.025)
Urobilinogen, UA: 0.2 U/dL
pH, UA: 6 (ref 5.0–8.0)

## 2024-03-23 MED ORDER — SULFAMETHOXAZOLE-TRIMETHOPRIM 800-160 MG PO TABS: 1.0000 | ORAL_TABLET | Freq: Two times a day (BID) | ORAL | 0 refills | Status: DC

## 2024-03-23 NOTE — Assessment & Plan Note (Signed)
 Acute, likely urinary tract infection given typical symptoms.  Urinalysis showed leukocytes and trace blood.  Will treat with Bactrim  double strength 1 tablet twice daily x 3 days.  Will send urine for culture. Push fluids.  Return and ER precautions provided.

## 2024-03-23 NOTE — Progress Notes (Signed)
 Patient ID: Vanessa Jones, female    DOB: 05/27/1941, 83 y.o.   MRN: [id number]  This visit was conducted in person.  BP 112/64 (BP Location: Right Arm, Patient Position: Sitting, Cuff Size: Large)   Pulse (!) 102   Temp 98 F (36.7 C) (Temporal)   Ht 5' 4.5" (1.638 m)   Wt 185 lb (83.9 kg)   SpO2 95%   BMI 31.26 kg/m    CC:  Chief Complaint  Patient presents with   Urinary Frequency    Subjective:   HPI: Vanessa Jones is a 83 y.o. female presenting on 03/23/2024 for Urinary Frequency   Urinary Frequency  This is a new problem. The current episode started in the past 7 days. The problem has been waxing and waning. The quality of the pain is described as burning. There has been no fever. She is Not sexually active. There is No history of pyelonephritis. Associated symptoms include frequency and urgency. Pertinent negatives include no chills, discharge, flank pain, hematuria, hesitancy, nausea, possible pregnancy, sweats or vomiting. Associated symptoms comments:  Cloudy urine. She has tried increased fluids for the symptoms. The treatment provided mild relief. There is no history of catheterization, kidney stones, recurrent UTIs, a single kidney, urinary stasis or a urological procedure. last year UTi, no antibiotics in the last month         Relevant past medical, surgical, family and social history reviewed and updated as indicated. Interim medical history since our last visit reviewed. Allergies and medications reviewed and updated. Outpatient Medications Prior to Visit  Medication Sig Dispense Refill   acetaminophen  (TYLENOL ) 500 MG tablet Take 500 mg by mouth every 6 (six) hours as needed.     atorvastatin  (LIPITOR) 10 MG tablet Take 1 tablet (10 mg total) by mouth every Monday, Wednesday, and Friday at 6 PM. 36 tablet 5   diphenhydrAMINE (BENADRYL) 25 MG tablet Take 25 mg by mouth every 6 (six) hours as needed.     glucosamine-chondroitin 500-400 MG tablet Take 1  tablet by mouth 3 (three) times daily.     hydrochlorothiazide  (HYDRODIURIL ) 25 MG tablet TAKE 1/2 TABLET(12.5 MG) BY MOUTH DAILY 45 tablet 1   lisinopril  (ZESTRIL ) 10 MG tablet TAKE 1 TABLET(10 MG) BY MOUTH DAILY 90 tablet 1   Multiple Vitamins-Minerals (MULTIPLE VITAMINS/WOMENS PO) Take by mouth.     VITAMIN D , CHOLECALCIFEROL, PO Take 400 Units by mouth.     EPINEPHrine  0.3 mg/0.3 mL IJ SOAJ injection SMARTSIG:0.3 Milligram(s) IM Once PRN (Patient not taking: Reported on 06/30/2023)     nitrofurantoin , macrocrystal-monohydrate, (MACROBID ) 100 MG capsule Take 1 capsule (100 mg total) by mouth 2 (two) times daily. (Patient not taking: Reported on 07/07/2023) 10 capsule 0   No facility-administered medications prior to visit.     Per HPI unless specifically indicated in ROS section below Review of Systems  Constitutional:  Negative for chills, fatigue and fever.  HENT:  Negative for congestion.   Eyes:  Negative for pain.  Respiratory:  Negative for cough and shortness of breath.   Cardiovascular:  Negative for chest pain, palpitations and leg swelling.  Gastrointestinal:  Negative for abdominal pain, nausea and vomiting.  Genitourinary:  Positive for frequency and urgency. Negative for dysuria, flank pain, hematuria, hesitancy and vaginal bleeding.  Musculoskeletal:  Negative for back pain.  Neurological:  Negative for syncope, light-headedness and headaches.  Psychiatric/Behavioral:  Negative for dysphoric mood.    Objective:  BP 112/64 (BP Location: Right Arm,  Patient Position: Sitting, Cuff Size: Large)   Pulse (!) 102   Temp 98 F (36.7 C) (Temporal)   Ht 5' 4.5" (1.638 m)   Wt 185 lb (83.9 kg)   SpO2 95%   BMI 31.26 kg/m   Wt Readings from Last 3 Encounters:  03/23/24 185 lb (83.9 kg)  07/07/23 183 lb 8 oz (83.2 kg)  06/14/23 183 lb (83 kg)      Physical Exam Constitutional:      General: She is not in acute distress.    Appearance: Normal appearance. She is  well-developed. She is not ill-appearing or toxic-appearing.  HENT:     Head: Normocephalic.     Right Ear: Hearing, tympanic membrane, ear canal and external ear normal. Tympanic membrane is not erythematous, retracted or bulging.     Left Ear: Hearing, tympanic membrane, ear canal and external ear normal. Tympanic membrane is not erythematous, retracted or bulging.     Nose: No mucosal edema or rhinorrhea.     Right Sinus: No maxillary sinus tenderness or frontal sinus tenderness.     Left Sinus: No maxillary sinus tenderness or frontal sinus tenderness.     Mouth/Throat:     Pharynx: Uvula midline.  Eyes:     General: Lids are normal. Lids are everted, no foreign bodies appreciated.     Conjunctiva/sclera: Conjunctivae normal.     Pupils: Pupils are equal, round, and reactive to light.  Neck:     Thyroid : No thyroid  mass or thyromegaly.     Vascular: No carotid bruit.     Trachea: Trachea normal.  Cardiovascular:     Rate and Rhythm: Normal rate and regular rhythm.     Pulses: Normal pulses.     Heart sounds: Normal heart sounds, S1 normal and S2 normal. No murmur heard.    No friction rub. No gallop.  Pulmonary:     Effort: Pulmonary effort is normal. No tachypnea or respiratory distress.     Breath sounds: Normal breath sounds. No decreased breath sounds, wheezing, rhonchi or rales.  Abdominal:     General: Bowel sounds are normal.     Palpations: Abdomen is soft.     Tenderness: There is no abdominal tenderness.  Musculoskeletal:     Cervical back: Normal range of motion and neck supple.  Skin:    General: Skin is warm and dry.     Findings: No rash.  Neurological:     Mental Status: She is alert.  Psychiatric:        Mood and Affect: Mood is not anxious or depressed.        Speech: Speech normal.        Behavior: Behavior normal. Behavior is cooperative.        Thought Content: Thought content normal.        Judgment: Judgment normal.       Results for orders  placed or performed in visit on 03/23/24  POCT Urinalysis Dipstick (Automated)   Collection Time: 03/23/24  2:19 PM  Result Value Ref Range   Color, UA Yellow    Clarity, UA Cloudy    Glucose, UA Negative Negative   Bilirubin, UA Negative    Ketones, UA Negative    Spec Grav, UA 1.015 1.010 - 1.025   Blood, UA Trace    pH, UA 6.0 5.0 - 8.0   Protein, UA Positive (A) Negative   Urobilinogen, UA 0.2 0.2 or 1.0 E.U./dL   Nitrite, UA Negative  Leukocytes, UA Large (3+) (A) Negative    Assessment and Plan  Urinary frequency Assessment & Plan: Acute, likely urinary tract infection given typical symptoms.  Urinalysis showed leukocytes and trace blood.  Will treat with Bactrim  double strength 1 tablet twice daily x 3 days.  Will send urine for culture. Push fluids.  Return and ER precautions provided.   Orders: -     POCT Urinalysis Dipstick (Automated) -     Urine Culture  Other orders -     Sulfamethoxazole -Trimethoprim ; Take 1 tablet by mouth 2 (two) times daily.  Dispense: 6 tablet; Refill: 0    Return in about 3 weeks (around 04/13/2024) for AMW WITH ME with fasting labs prior.   Herby Lolling, MD

## 2024-03-24 LAB — URINE CULTURE
MICRO NUMBER:: 16370954
Result:: NO GROWTH
SPECIMEN QUALITY:: ADEQUATE

## 2024-03-27 ENCOUNTER — Telehealth: Payer: Self-pay | Admitting: *Deleted

## 2024-03-27 ENCOUNTER — Encounter: Payer: Self-pay | Admitting: Family Medicine

## 2024-03-27 DIAGNOSIS — R300 Dysuria: Secondary | ICD-10-CM

## 2024-03-27 NOTE — Telephone Encounter (Signed)
 Copied from CRM [id number]. Topic: Clinical - Medication Question >> Mar 27, 2024  9:08 AM Oddis Bench wrote: Reason for CRM: Patient is calling to get a higher medication for the kidney infection she is out of the sulfamethoxazole -trimethoprim  (BACTRIM  DS) 800-160 MG tablet and she is stating that med is helping but she still has the burning and not using the bathroom as much. She would like to have the nurse call her back. She is stating that there were only 6 and now she is out and would like some that are stronger if possible.

## 2024-03-27 NOTE — Telephone Encounter (Signed)
 Mrs. Leisey notified as instructed by telephone. She does not wish to see a urologist at this time.  She wants to try the recommendations first and if symptoms don't resolve then she will call back for the referral.  I did go ahead and cancel the referral.  FYI to Dr. Cherlyn Cornet.

## 2024-03-27 NOTE — Telephone Encounter (Signed)
 Please call patient.  There was no bacteria seen in her urine so more antibiotic would not be helpful.  I would suggest that the symptoms could be due to bladder irritation.  Make sure she is drinking lots of water and avoiding foods and drinks that irritate the bladder such as tomato, citrus, caffeine, alcohol, soda, spicy foods. Have her start taking Azo to help with the burning and I will place a referral to a urologist for evaluation to make sure there is no evidence of interstitial cystitis or another irritant cause of her symptoms.  She can always cancel this appointment if her symptoms resolved with the above recommendations.

## 2024-04-04 ENCOUNTER — Telehealth: Payer: Self-pay | Admitting: *Deleted

## 2024-04-04 DIAGNOSIS — R7303 Prediabetes: Secondary | ICD-10-CM

## 2024-04-04 DIAGNOSIS — E7800 Pure hypercholesterolemia, unspecified: Secondary | ICD-10-CM

## 2024-04-04 DIAGNOSIS — E559 Vitamin D deficiency, unspecified: Secondary | ICD-10-CM

## 2024-04-04 NOTE — Telephone Encounter (Signed)
-----   Message from Gerry Krone sent at 04/03/2024 12:25 PM EDT ----- Regarding: Lab orders for [name], 5.12.25 Patient is scheduled for CPX labs, please order future labs, Thanks , Vanessa Jones

## 2024-04-10 ENCOUNTER — Other Ambulatory Visit (INDEPENDENT_AMBULATORY_CARE_PROVIDER_SITE_OTHER)

## 2024-04-10 ENCOUNTER — Encounter: Payer: Self-pay | Admitting: Family Medicine

## 2024-04-10 DIAGNOSIS — R7303 Prediabetes: Secondary | ICD-10-CM

## 2024-04-10 DIAGNOSIS — E7800 Pure hypercholesterolemia, unspecified: Secondary | ICD-10-CM | POA: Diagnosis not present

## 2024-04-10 DIAGNOSIS — E559 Vitamin D deficiency, unspecified: Secondary | ICD-10-CM | POA: Diagnosis not present

## 2024-04-10 LAB — LIPID PANEL
Cholesterol: 156 mg/dL (ref 0–200)
HDL: 61.6 mg/dL (ref >39.00)
LDL Cholesterol: 78 mg/dL (ref 0–99)
NonHDL: 94.08
Total CHOL/HDL Ratio: 3
Triglycerides: 78 mg/dL (ref 0.0–149.0)
VLDL: 15.6 mg/dL (ref 0.0–40.0)

## 2024-04-10 LAB — COMPREHENSIVE METABOLIC PANEL WITH GFR
ALT: 7 U/L (ref 0–35)
AST: 14 U/L (ref 0–37)
Albumin: 4.4 g/dL (ref 3.5–5.2)
Alkaline Phosphatase: 69 U/L (ref 39–117)
BUN: 11 mg/dL (ref 6–23)
CO2: 28 meq/L (ref 19–32)
Calcium: 9.5 mg/dL (ref 8.4–10.5)
Chloride: 97 meq/L (ref 96–112)
Creatinine, Ser: 0.8 mg/dL (ref 0.40–1.20)
GFR: 69.36 mL/min (ref >60.00)
Glucose, Bld: 93 mg/dL (ref 70–99)
Potassium: 4.4 meq/L (ref 3.5–5.1)
Sodium: 134 meq/L — ABNORMAL LOW (ref 135–145)
Total Bilirubin: 0.6 mg/dL (ref 0.2–1.2)
Total Protein: 6.7 g/dL (ref 6.0–8.3)

## 2024-04-10 LAB — VITAMIN D 25 HYDROXY (VIT D DEFICIENCY, FRACTURES): VITD: 43.03 ng/mL (ref 30.00–100.00)

## 2024-04-10 LAB — HEMOGLOBIN A1C: Hgb A1c MFr Bld: 6.3 % (ref 4.6–6.5)

## 2024-04-10 NOTE — Progress Notes (Signed)
 No critical labs need to be addressed urgently. We will discuss labs in detail at upcoming office visit.

## 2024-04-17 ENCOUNTER — Encounter: Admitting: Family Medicine

## 2024-04-18 ENCOUNTER — Encounter: Payer: Self-pay | Admitting: Family Medicine

## 2024-04-18 ENCOUNTER — Ambulatory Visit (INDEPENDENT_AMBULATORY_CARE_PROVIDER_SITE_OTHER): Admitting: Family Medicine

## 2024-04-18 VITALS — BP 112/62 | HR 104 | Temp 98.6°F | Ht 64.0 in | Wt 182.2 lb

## 2024-04-18 DIAGNOSIS — E559 Vitamin D deficiency, unspecified: Secondary | ICD-10-CM | POA: Diagnosis not present

## 2024-04-18 DIAGNOSIS — E7800 Pure hypercholesterolemia, unspecified: Secondary | ICD-10-CM

## 2024-04-18 DIAGNOSIS — I10 Essential (primary) hypertension: Secondary | ICD-10-CM | POA: Diagnosis not present

## 2024-04-18 DIAGNOSIS — Z0000 Encounter for general adult medical examination without abnormal findings: Secondary | ICD-10-CM | POA: Diagnosis not present

## 2024-04-18 DIAGNOSIS — R000 Tachycardia, unspecified: Secondary | ICD-10-CM | POA: Insufficient documentation

## 2024-04-18 DIAGNOSIS — R7303 Prediabetes: Secondary | ICD-10-CM | POA: Diagnosis not present

## 2024-04-18 DIAGNOSIS — M170 Bilateral primary osteoarthritis of knee: Secondary | ICD-10-CM

## 2024-04-18 NOTE — Assessment & Plan Note (Signed)
Stable, chronic.  Continue current medication.    

## 2024-04-18 NOTE — Progress Notes (Signed)
 Patient ID: Vanessa Jones, female    DOB: [birth year], 83 y.o.   MRN: [id number]  This visit was conducted in person.  Blood pressure 132/78, pulse 94, temperature 98 F (36.7 C), resp. rate 16, height 5\' 4"  (1.626 m), weight 181 lb 7 oz (82.3 kg), SpO2 94 %.  Chief Complaint  Patient presents with   Medicare Wellness      Subjective:   HPI: The patient presents for annual medicare wellness, complete physical and review of chronic health problems. He/She also has the following acute concerns today:  She is having significant arthritis  in knee... requests referral.   The patient saw a LPN or RN for medicare wellness visit.  Prevention and wellness was reviewed in detail. Note reviewed and important notes copied below.  I have personally reviewed the Medicare Annual Wellness questionnaire and have noted 1. The patient's medical and social history 2. Their use of alcohol, tobacco or illicit drugs 3. Their current medications and supplements 4. The patient's functional ability including ADL's, fall risks, home safety risks and hearing or visual             impairment. 5. Diet and physical activities 6. Evidence for depression or mood disorders 7.         Updated provider list Cognitive evaluation was performed and recorded on pt medicare questionnaire form. The patients weight, height, BMI and visual acuity have been recorded in the chart   I have made referrals, counseling and provided education to the patient based review of the above and I have provided the pt with a written personalized care plan for preventive services.   Documentation of this information was scanned into the electronic record under the media tab.   Advance directives and end of life planning reviewed in detail with patient and documented in EMR. Patient given handout on advance care directives if needed. HCPOA and living will updated if needed.  Hearing Screening - Comments:: Wears Bilateral Hearing  Aides Vision Screening - Comments:: Eye Exam 2024 at [facility] Had Bilateral Cataract Surgery     03/10/2021    9:23 AM 03/16/2022   11:36 AM 03/30/2023   11:01 AM 04/06/2023    8:40 AM 03/23/2024    2:06 PM  Fall Risk  Falls in the past year?  0 0 0 0  Was there an injury with Fall?  0 0 0 0  Fall Risk Category Calculator  0 0 0 0  Fall Risk Category (Retired)  Low     (RETIRED) Patient Fall Risk Level Moderate fall risk Low fall risk     Patient at Risk for Falls Due to  Medication side effect No Fall Risks No Fall Risks No Fall Risks  Fall risk Follow up  Falls evaluation completed;Education provided;Falls prevention discussed Falls prevention discussed;Education provided Falls evaluation completed Falls evaluation completed     Flowsheet Row Office Visit from 03/23/2024 in [facility]  PHQ-2 Total Score 0      Hypertension:    Well controlled  BP Readings from Last 3 Encounters:  04/18/24 112/62  03/23/24 112/64  07/07/23 126/76  At goal on HCTZ and lisinopril   Using medication without problems or lightheadedness:  none Chest pain with exertion: none Edema: none Short of breath: stable overall Average home BPs: Other issues: some    Prediabetes  stable control Lab Results  Component Value Date   HGBA1C 6.3 04/10/2024    Elevated  Cholesterol:   Improved control on  atorvastatin  10 mg  every other day  Lab Results  Component Value Date   CHOL 156 04/10/2024   HDL 61.60 04/10/2024   LDLCALC 78 04/10/2024   TRIG 78.0 04/10/2024   CHOLHDL 3 04/10/2024  Using medications without problems: Muscle aches:   YES with statin Diet compliance: moderate, some  Exercise:  rare given knee issue Other complaints: Wt Readings from Last 3 Encounters:  04/18/24 182 lb 4 oz (82.7 kg)  03/23/24 185 lb (83.9 kg)  07/07/23 183 lb 8 oz (83.2 kg)      Vit D at goal on supplement D3        Relevant past medical, surgical, family  and social history reviewed and updated as indicated. Interim medical history since our last visit reviewed. Allergies and medications reviewed and updated. Outpatient Medications Prior to Visit  Medication Sig Dispense Refill   acetaminophen  (TYLENOL ) 500 MG tablet Take 500 mg by mouth every 6 (six) hours as needed.     aspirin EC 81 MG tablet Take 81 mg by mouth daily. Swallow whole.     atorvastatin  (LIPITOR) 10 MG tablet Take 1 tablet (10 mg total) by mouth every Monday, Wednesday, and Friday at 6 PM. 36 tablet 5   diphenhydrAMINE (BENADRYL) 25 MG tablet Take 25 mg by mouth every 6 (six) hours as needed.     Glucosamine-Chondroitin (MOVE FREE PO) Take 1 tablet by mouth daily.     hydrochlorothiazide  (HYDRODIURIL ) 25 MG tablet TAKE 1/2 TABLET(12.5 MG) BY MOUTH DAILY 45 tablet 1   lisinopril  (ZESTRIL ) 10 MG tablet TAKE 1 TABLET(10 MG) BY MOUTH DAILY 90 tablet 1   Multiple Vitamins-Minerals (MULTIPLE VITAMINS/WOMENS PO) Take by mouth.     Phenazopyridine HCl (AZO TABS PO) Take 1 tablet by mouth daily.     VITAMIN D , CHOLECALCIFEROL, PO Take 400 Units by mouth.     glucosamine-chondroitin 500-400 MG tablet Take 1 tablet by mouth 3 (three) times daily.     sulfamethoxazole -trimethoprim  (BACTRIM  DS) 800-160 MG tablet Take 1 tablet by mouth 2 (two) times daily. 6 tablet 0   No facility-administered medications prior to visit.     Per HPI unless specifically indicated in ROS section below Review of Systems  Constitutional:  Negative for fatigue and fever.  HENT:  Negative for congestion.   Eyes:  Negative for pain.  Respiratory:  Negative for cough and shortness of breath.   Cardiovascular:  Negative for chest pain, palpitations and leg swelling.  Gastrointestinal:  Negative for abdominal pain.  Genitourinary:  Negative for dysuria and vaginal bleeding.  Musculoskeletal:  Negative for back pain.  Neurological:  Negative for syncope, light-headedness and headaches.  Psychiatric/Behavioral:   Negative for dysphoric mood.    Objective:  BP 112/62   Pulse (!) 104   Temp 98.6 F (37 C) (Temporal)   Ht 5\' 4"  (1.626 m)   Wt 182 lb 4 oz (82.7 kg)   SpO2 94%   BMI 31.28 kg/m   Wt Readings from Last 3 Encounters:  04/18/24 182 lb 4 oz (82.7 kg)  03/23/24 185 lb (83.9 kg)  07/07/23 183 lb 8 oz (83.2 kg)      Physical Exam Vitals and nursing note reviewed.  Constitutional:      General: She is not in acute distress.    Appearance: Normal appearance. She is well-developed. She is not ill-appearing or toxic-appearing.  HENT:     Head: Normocephalic.     Right  Ear: Hearing, tympanic membrane, ear canal and external ear normal.     Left Ear: Hearing, tympanic membrane, ear canal and external ear normal.     Nose: Nose normal.  Eyes:     General: Lids are normal. Lids are everted, no foreign bodies appreciated.     Conjunctiva/sclera: Conjunctivae normal.     Pupils: Pupils are equal, round, and reactive to light.  Neck:     Thyroid : No thyroid  mass or thyromegaly.     Vascular: No carotid bruit.     Trachea: Trachea normal.  Cardiovascular:     Rate and Rhythm: Regular rhythm. Tachycardia present.     Heart sounds: Normal heart sounds, S1 normal and S2 normal. No murmur heard.    No gallop.  Pulmonary:     Effort: Pulmonary effort is normal. No respiratory distress.     Breath sounds: Normal breath sounds. No wheezing, rhonchi or rales.  Abdominal:     General: Bowel sounds are normal. There is no distension or abdominal bruit.     Palpations: Abdomen is soft. There is no fluid wave or mass.     Tenderness: There is no abdominal tenderness. There is no guarding or rebound.     Hernia: No hernia is present.  Musculoskeletal:     Cervical back: Normal range of motion and neck supple.  Lymphadenopathy:     Cervical: No cervical adenopathy.  Skin:    General: Skin is warm and dry.     Findings: No rash.  Neurological:     Mental Status: She is alert.     Cranial  Nerves: No cranial nerve deficit.     Sensory: No sensory deficit.  Psychiatric:        Mood and Affect: Mood is not anxious or depressed.        Speech: Speech normal.        Behavior: Behavior normal. Behavior is cooperative.        Judgment: Judgment normal.       Results for orders placed or performed in visit on 04/10/24  Comprehensive metabolic panel   Collection Time: 04/10/24  8:35 AM  Result Value Ref Range   Sodium 134 (L) 135 - 145 mEq/L   Potassium 4.4 3.5 - 5.1 mEq/L   Chloride 97 96 - 112 mEq/L   CO2 28 19 - 32 mEq/L   Glucose, Bld 93 70 - 99 mg/dL   BUN 11 6 - 23 mg/dL   Creatinine, Ser 8.29 0.40 - 1.20 mg/dL   Total Bilirubin 0.6 0.2 - 1.2 mg/dL   Alkaline Phosphatase 69 39 - 117 U/L   AST 14 0 - 37 U/L   ALT 7 0 - 35 U/L   Total Protein 6.7 6.0 - 8.3 g/dL   Albumin 4.4 3.5 - 5.2 g/dL   GFR 56.21 >30.86 mL/min   Calcium  9.5 8.4 - 10.5 mg/dL  Lipid panel   Collection Time: 04/10/24  8:35 AM  Result Value Ref Range   Cholesterol 156 0 - 200 mg/dL   Triglycerides 57.8 0.0 - 149.0 mg/dL   HDL 46.96 >29.52 mg/dL   VLDL 84.1 0.0 - 32.4 mg/dL   LDL Cholesterol 78 0 - 99 mg/dL   Total CHOL/HDL Ratio 3    NonHDL 94.08   Hemoglobin A1c   Collection Time: 04/10/24  8:35 AM  Result Value Ref Range   Hgb A1c MFr Bld 6.3 4.6 - 6.5 %  VITAMIN D  25 Hydroxy (Vit-D Deficiency, Fractures)  Collection Time: 04/10/24  8:35 AM  Result Value Ref Range   VITD 43.03 30.00 - 100.00 ng/mL     COVID 19 screen:  No recent travel or known exposure to COVID19 The patient denies respiratory symptoms of COVID 19 at this time. The importance of social distancing was discussed today.   Assessment and Plan The patient's preventative maintenance and recommended screening tests for an annual wellness exam were reviewed in full today. Brought up to date unless services declined.  Counselled on the importance of diet, exercise, and its role in overall health and mortality. The  patient's FH and SH was reviewed, including their home life, tobacco status, and drug and alcohol status.   Colon: 02/2021  [facility],no further indicated  Family history of colon cancer in mother .   PAP/DVE: PAP not indicated due to age. Has decided no DVE, no symptoms, no family history of uterine and ovarian cancer.   DEXA: 2017 normal improved, 03/2023 normal, repeat in 5 years Mammogram: Last 2024 sister with breast cancer. Repeat yearly. .. DUE Vaccines:  Consider Td and  shingles vaccine    Uptodate with PNA, S/P COVID x 3 , consider booster  Hep C: done   Problem List Items Addressed This Visit     Essential hypertension, benign (Chronic)   Stable, chronic.  Continue current medication.   HCTZ 25 mg daily Lisinopril  10 mg daily      Relevant Medications   aspirin EC 81 MG tablet   Increased heart rate   Acute, patient asymptomatic but heart rate elevated in office today.  Discussed potentially likely due to her rushing here but persistently elevated through appointment.  She will follow this at home and let me know if her heart rate is remaining up.  She already avoids caffeine.  No alcohol use.  No red flags on exam.  Rhythm regular      Osteoarthritis   Relevant Medications   aspirin EC 81 MG tablet   Prediabetes   Chronic stable control  Lab Results  Component Value Date   HGBA1C 6.3 04/10/2024         Pure hypercholesterolemia (Chronic)    Chronic,well controlled  Atorvastatin  10 mg QOD      Relevant Medications   aspirin EC 81 MG tablet   Vitamin D  deficiency   Stable, chronic.  Continue current medication.         Other Visit Diagnoses       Medicare annual wellness visit, subsequent    -  Primary       No orders of the defined types were placed in this encounter.  No orders of the defined types were placed in this encounter.     Herby Lolling, MD

## 2024-04-18 NOTE — Assessment & Plan Note (Signed)
 Acute, patient asymptomatic but heart rate elevated in office today.  Discussed potentially likely due to her rushing here but persistently elevated through appointment.  She will follow this at home and let me know if her heart rate is remaining up.  She already avoids caffeine.  No alcohol use.  No red flags on exam.  Rhythm regular

## 2024-04-18 NOTE — Assessment & Plan Note (Signed)
Stable, chronic.  Continue current medication.   HCTZ 25 mg daily Lisinopril 10 mg daily 

## 2024-04-18 NOTE — Assessment & Plan Note (Signed)
Chronic,well controlled  Atorvastatin 10 mg QOD

## 2024-04-18 NOTE — Assessment & Plan Note (Signed)
 Chronic stable control  Lab Results  Component Value Date   HGBA1C 6.3 04/10/2024

## 2024-04-18 NOTE — Patient Instructions (Signed)
 Call if HR remaining increased > 90, follow BP at home.

## 2024-04-26 ENCOUNTER — Ambulatory Visit: Payer: Self-pay

## 2024-04-26 MED ORDER — BENZONATATE 200 MG PO CAPS: 200.0000 mg | ORAL_CAPSULE | Freq: Two times a day (BID) | ORAL | 0 refills | Status: DC | PRN

## 2024-04-26 NOTE — Telephone Encounter (Signed)
 Left message for Mrs. Denapoli that Dr Cherlyn Cornet will send in a prescription for benzonatate 200 mg p.o. twice daily  #30 0 RF as needed for cough. Symptoms may simply be viral infection, but if not improving as expected and she thinks an  antibiotic is needed she needs to make an appointment to be seen here or at an urgent care. If severe shortness of breath she needs to go to the ER.   Rx for Tessalon Perles 200 mg printed so I faxed it to [facility] at [place] at [phone].

## 2024-04-26 NOTE — Telephone Encounter (Signed)
  Chief Complaint: cough Symptoms: productive cough with thick yellow sputum, runny nose, congestion and some wheezing Frequency: since last Thursday or Friday Pertinent Negatives: Patient denies fever, CP, SOB Disposition: [] ED /[] Urgent Care (no appt availability in office) / [] Appointment(In office/virtual)/ []  [facility] [facility]/ [] Home Care/ [] Refused Recommended Disposition /[] [place] [facility]/ [x]  Follow-up with PCP Additional Notes: patient calling with concerns for cough. Patient endorses coughing spells with thick yellow sputum, runny nose and congestion. Patient also endorses some wheezing. Patient states symptoms started last Thursday or Friday. Per protocol, patient is recommended to be seen today. Discussed with patient a virtual Urgent care visit but patient states all she wanted was medication sent in for symptoms. Patient verbalized understanding and all questions answered. Patient is asking for a phone call from staff about medication   Copied from CRM [id number]. Topic: Clinical - Red Word Triage >> Apr 26, 2024  8:40 AM Kita Perish H wrote: [facility] that prompted transfer to Nurse Triage: Sever coughing spells, and thick yellow mucus, runny nose, thick congestion Reason for Disposition  Wheezing is present  Answer Assessment - Initial Assessment Questions 1. ONSET: "When did the cough begin?"      Last Thursday or Friday 2. SEVERITY: "How bad is the cough today?"      8 out of 10 3. SPUTUM: "Describe the color of your sputum" (none, dry cough; clear, white, yellow, green)     Yellow sputum 4. HEMOPTYSIS: "Are you coughing up any blood?" If so ask: "How much?" (flecks, streaks, tablespoons, etc.)     no 5. DIFFICULTY BREATHING: "Are you having difficulty breathing?" If Yes, ask: "How bad is it?" (e.g., mild, moderate, severe)    - MILD: No SOB at rest, mild SOB with walking, speaks normally in sentences, can lie down, no retractions, pulse < 100.    - MODERATE: SOB  at rest, SOB with minimal exertion and prefers to sit, cannot lie down flat, speaks in phrases, mild retractions, audible wheezing, pulse 100-120.    - SEVERE: Very SOB at rest, speaks in single words, struggling to breathe, sitting hunched forward, retractions, pulse > 120      no 6. FEVER: "Do you have a fever?" If Yes, ask: "What is your temperature, how was it measured, and when did it start?"     no 7. CARDIAC HISTORY: "Do you have any history of heart disease?" (e.g., heart attack, congestive heart failure)      no 8. LUNG HISTORY: "Do you have any history of lung disease?"  (e.g., pulmonary embolus, asthma, emphysema)     no 9. PE RISK FACTORS: "Do you have a history of blood clots?" (or: recent major surgery, recent prolonged travel, bedridden)     no 10. OTHER SYMPTOMS: "Do you have any other symptoms?" (e.g., runny nose, wheezing, chest pain)       Slight wheezing 12. TRAVEL: "Have you traveled out of the country in the last month?" (e.g., travel history, exposures)       no  Protocols used: Cough - Acute Productive-A-AH

## 2024-04-26 NOTE — Addendum Note (Signed)
 Addended by: Wyn Heater on: 04/26/2024 01:47 PM   Modules accepted: Orders

## 2024-07-06 ENCOUNTER — Other Ambulatory Visit: Payer: Self-pay | Admitting: Family Medicine

## 2024-07-06 NOTE — Telephone Encounter (Signed)
 Medication list and refill request states 1/2 tablet daily.  Your last office note states 1 tablet daily for her hydrochlorothiazide .  Please advise.

## 2024-07-25 NOTE — Progress Notes (Unsigned)
     Vanessa Popowski T. Saara Kijowski, MD, CAQ Sports Medicine [facility] [address] [place], [address]  Phone: [phone]  FAX: [phone]  Vanessa Jones - 83 y.o. female  MRN [id number]  Date of Birth: October 17, 1941  Date: 07/26/2024  PCP: Avelina Greig BRAVO, MD  Referral: Avelina Greig BRAVO, MD  No chief complaint on file.  Subjective:   Vanessa Jones is a 83 y.o. very pleasant female patient with There is no height or weight on file to calculate BMI. who presents with the following:  Discussed the use of AI scribe software for clinical note transcription with the patient, who gave verbal consent to proceed.  This is a pleasant patient referred by Dr. Avelina for evaluation of left-sided knee pain.  Radiographs from almost 10 years ago showed mild knee arthritis. History of Present Illness     Review of Systems is noted in the HPI, as appropriate  Objective:   There were no vitals taken for this visit.  GEN: No acute distress; alert,appropriate. PULM: Breathing comfortably in no respiratory distress PSYCH: Normally interactive.    Laboratory and Imaging Data:  Assessment and Plan:   No diagnosis found. Assessment & Plan   Medication Management during today's office visit: No orders of the defined types were placed in this encounter.  There are no discontinued medications.  Orders placed today for conditions managed today: No orders of the defined types were placed in this encounter.   Disposition: No follow-ups on file.  [employer] One speech-to-text software was used for transcription in this dictation.  Possible transcriptional errors can occur using Animal nutritionist.   Signed,  Vanessa Jones. Vanessa Runyan, MD   Outpatient Encounter Medications as of 07/26/2024  Medication Sig   acetaminophen  (TYLENOL ) 500 MG tablet Take 500 mg by mouth every 6 (six) hours as needed.   aspirin EC 81 MG tablet Take 81 mg by mouth daily.  Swallow whole.   atorvastatin  (LIPITOR) 10 MG tablet Take 1 tablet (10 mg total) by mouth every Monday, Wednesday, and Friday at 6 PM.   benzonatate  (TESSALON ) 200 MG capsule Take 1 capsule (200 mg total) by mouth 2 (two) times daily as needed for cough.   diphenhydrAMINE (BENADRYL) 25 MG tablet Take 25 mg by mouth every 6 (six) hours as needed.   Glucosamine-Chondroitin (MOVE FREE PO) Take 1 tablet by mouth daily.   hydrochlorothiazide  (HYDRODIURIL ) 25 MG tablet TAKE 1/2 TABLET(12.5 MG) BY MOUTH DAILY   lisinopril  (ZESTRIL ) 10 MG tablet TAKE 1 TABLET(10 MG) BY MOUTH DAILY   Multiple Vitamins-Minerals (MULTIPLE VITAMINS/WOMENS PO) Take by mouth.   Phenazopyridine HCl (AZO TABS PO) Take 1 tablet by mouth daily.   VITAMIN D , CHOLECALCIFEROL, PO Take 400 Units by mouth.   No facility-administered encounter medications on file as of 07/26/2024.

## 2024-07-26 ENCOUNTER — Ambulatory Visit (INDEPENDENT_AMBULATORY_CARE_PROVIDER_SITE_OTHER)
Admission: RE | Admit: 2024-07-26 | Discharge: 2024-07-26 | Disposition: A | Discharge status: Home / Self Care | Source: Ambulatory Visit | Attending: Family Medicine | Admitting: Family Medicine

## 2024-07-26 ENCOUNTER — Encounter: Payer: Self-pay | Admitting: Family Medicine

## 2024-07-26 ENCOUNTER — Ambulatory Visit (INDEPENDENT_AMBULATORY_CARE_PROVIDER_SITE_OTHER): Admitting: Family Medicine

## 2024-07-26 VITALS — BP 140/90 | HR 92 | Temp 98.0°F | Ht 64.0 in | Wt 183.0 lb

## 2024-07-26 DIAGNOSIS — G8929 Other chronic pain: Secondary | ICD-10-CM | POA: Diagnosis not present

## 2024-07-26 DIAGNOSIS — M25562 Pain in left knee: Secondary | ICD-10-CM | POA: Diagnosis not present

## 2024-07-26 DIAGNOSIS — M1712 Unilateral primary osteoarthritis, left knee: Secondary | ICD-10-CM

## 2024-07-26 DIAGNOSIS — M1711 Unilateral primary osteoarthritis, right knee: Secondary | ICD-10-CM | POA: Diagnosis not present

## 2024-07-26 DIAGNOSIS — M25462 Effusion, left knee: Secondary | ICD-10-CM | POA: Diagnosis not present

## 2024-07-26 MED ORDER — TRIAMCINOLONE ACETONIDE 40 MG/ML IJ SUSP
40.0000 mg | Freq: Once | INTRAMUSCULAR | Status: AC
Start: 2024-07-26 — End: 2024-07-26
  Administered 2024-07-26: 40 mg via INTRA_ARTICULAR

## 2024-07-26 NOTE — Patient Instructions (Signed)
 OSTEOARTHRITIS: Over the counter  Tylenol : 2 tablets up to 3-4 times a day Regular NSAIDS are helpful (avoid in kidney disease and ulcers)  Supplements: Tart cherry juice and Curcumin (Turmeric extract) have good scientific evidence  - get the concentrated capsules or gelcaps over the counter so you do not get the calories from the juice.  Weight loss will always take stress off of the joints and back  Voltaren 1% gel, over the counter You can apply up to 4 times a day  This can be applied to any joint: knee, wrist, fingers, elbows, shoulders, feet and ankles. Can apply to any tendon: tennis elbow, achilles, tendon, rotator cuff or any other tendon.  Minimal is absorbed in the bloodstream: ok with oral anti-inflammatory or a blood thinner.  Cost is about 9 dollars  Ice joints on bad days, 20 min, 2-3 x / day REGULAR EXERCISE: swimming, Yoga, Tai Chi, bicycle (NON-IMPACT activity)

## 2024-08-01 ENCOUNTER — Telehealth: Payer: Self-pay

## 2024-08-01 NOTE — Telephone Encounter (Signed)
 Awaiting benefit coverage for Monovisc gel injection of the left knee.  Will notify provider when coverage is received.

## 2024-08-02 NOTE — Telephone Encounter (Signed)
 Please let patient know.

## 2024-08-02 NOTE — Telephone Encounter (Signed)
 Patient benefits include the following for Monovisc gel injection of the Left knee.  Pt has Medicare and [place] healthcare secondary insurance  20% co-pay 20% product co-pay 20% Administration co-pay   Deductible applies Once the deductible has been met, patient is responsible for a coinsurance Prior authorization is not required The secondary plan does not follow Medicare guidelines. It will pick up the remaining eligible expenses at 100%  Confirmed with [employer] [id number] and Vernell RAMAN [facility]) [phone]  Patient has met $199.72 of $257 deductible   Benefit explanation copy placed in providers box for review

## 2024-08-02 NOTE — Telephone Encounter (Signed)
 To confirm, deductible is almost met.  Medicare co-pay is 20%, but her secondary insurance will cover this 20%, correct?

## 2024-08-03 NOTE — Telephone Encounter (Signed)
 Mrs. Marcou notified as instructed by telephone.  She wants to wait and see how long the steroid injection last but will call back to schedule the Monovisc injection when she is ready.

## 2024-08-07 ENCOUNTER — Ambulatory Visit: Payer: Self-pay | Admitting: Family Medicine

## 2024-09-04 ENCOUNTER — Other Ambulatory Visit: Payer: Self-pay | Admitting: Family Medicine

## 2024-09-19 ENCOUNTER — Ambulatory Visit

## 2024-09-29 ENCOUNTER — Ambulatory Visit (INDEPENDENT_AMBULATORY_CARE_PROVIDER_SITE_OTHER)

## 2024-09-29 VITALS — BP 120/82 | Ht 64.0 in | Wt 183.0 lb

## 2024-09-29 DIAGNOSIS — Z23 Encounter for immunization: Secondary | ICD-10-CM | POA: Diagnosis not present

## 2024-09-29 DIAGNOSIS — Z0000 Encounter for general adult medical examination without abnormal findings: Secondary | ICD-10-CM | POA: Diagnosis not present

## 2024-09-29 NOTE — Progress Notes (Signed)
 Subjective:   LAURRIE TOPPIN is a 83 y.o. who presents for a Medicare Wellness preventive visit.  As a reminder, Annual Wellness Visits don't include a physical exam, and some assessments may be limited, especially if this visit is performed virtually. We may recommend an in-person follow-up visit with your provider if needed.  Visit Complete: In person  Persons Participating in Visit: Patient.  AWV Questionnaire: No: Patient Medicare AWV questionnaire was not completed prior to this visit.  Cardiac Risk Factors include: advanced age (>73men, >88 women);dyslipidemia;hypertension;obesity (BMI >30kg/m2)     Objective:    Today's Vitals   09/29/24 0923 09/29/24 0928  BP: 120/82   Weight: 183 lb (83 kg)   Height: 5' 4 (1.626 m)   PainSc:  2    Body mass index is 31.41 kg/m.     09/29/2024    9:52 AM 07/07/2023   10:12 AM 03/30/2023   10:58 AM 03/16/2022   11:35 AM 03/10/2021    9:20 AM 10/06/2019   11:18 AM 08/17/2017   12:06 PM  Advanced Directives  Does Patient Have a Medical Advance Directive? No No No No No No No   Would patient like information on creating a medical advance directive?  No - Patient declined No - Patient declined  No - Patient declined No - Patient declined No - Patient declined      Data saved with a previous flowsheet row definition    Current Medications (verified) Outpatient Encounter Medications as of 09/29/2024  Medication Sig   acetaminophen  (TYLENOL ) 500 MG tablet Take 500 mg by mouth every 6 (six) hours as needed.   aspirin EC 81 MG tablet Take 81 mg by mouth daily. Swallow whole.   atorvastatin  (LIPITOR) 10 MG tablet Take 1 tablet (10 mg total) by mouth every Monday, Wednesday, and Friday at 6 PM.   diphenhydrAMINE (BENADRYL) 25 MG tablet Take 25 mg by mouth every 6 (six) hours as needed.   FIBER GUMMIES PO Take 2 each by mouth daily as needed.   Glucosamine-Chondroitin (MOVE FREE PO) Take 1 tablet by mouth daily.   hydrochlorothiazide   (HYDRODIURIL ) 25 MG tablet TAKE 1/2 TABLET(12.5 MG) BY MOUTH DAILY   lisinopril  (ZESTRIL ) 10 MG tablet TAKE 1 TABLET(10 MG) BY MOUTH DAILY   Phenazopyridine HCl (AZO TABS PO) Take 1 tablet by mouth daily.   VITAMIN D , CHOLECALCIFEROL, PO Take 400 Units by mouth.   No facility-administered encounter medications on file as of 09/29/2024.    Allergies (verified) Cephalexin    History: Past Medical History:  Diagnosis Date   Allergic dermatitis    Allergic rhinitis    Bilateral sensorineural hearing loss    Complex tear of medial meniscus of left knee    Diverticulitis    Eczema    GERD (gastroesophageal reflux disease)    History of chickenpox    Hyperlipidemia    Hypertension    Osteoarthritis    LEFT KNEE   Seasonal allergies    Serrated adenoma of colon    Tubular adenoma of colon    Past Surgical History:  Procedure Laterality Date   CATARACT EXTRACTION W/PHACO Left 06/14/2023   Procedure: CATARACT EXTRACTION PHACO AND INTRAOCULAR LENS PLACEMENT (IOC) LEFT 11.95 01:12.0;  Surgeon: Mittie Gaskin, MD;  Location: [facility];  Service: Ophthalmology;  Laterality: Left;   CATARACT EXTRACTION W/PHACO Right 07/07/2023   Procedure: CATARACT EXTRACTION PHACO AND INTRAOCULAR LENS PLACEMENT (IOC) RIGHT 9.59 01:08.0;  Surgeon: Mittie Gaskin, MD;  Location: [facility];  Service: Ophthalmology;  Laterality: Right;   CHOLECYSTECTOMY     COLONOSCOPY     COLONOSCOPY WITH PROPOFOL  N/A 08/17/2017   Procedure: COLONOSCOPY WITH PROPOFOL ;  Surgeon: Gaylyn Gladis PENNER, MD;  Location: [facility];  Service: Endoscopy;  Laterality: N/A;   COLONOSCOPY WITH PROPOFOL  N/A 03/10/2021   Procedure: COLONOSCOPY WITH PROPOFOL ;  Surgeon: Maryruth Ole DASEN, MD;  Location: [facility];  Service: Endoscopy;  Laterality: N/A;   HEMORRHOID SURGERY     KNEE ARTHROSCOPY WITH MENISCAL REPAIR Left 08/08/2015   Procedure: KNEE ARTHROSCOPY WITH medial and lateral menisectomy;  Surgeon:  Norleen JINNY Maltos, MD;  Location: [facility];  Service: Orthopedics;  Laterality: Left;   TUBAL LIGATION     Family History  Problem Relation Age of Onset   Hypertension Mother    Cancer Mother        colon, age 21   Diabetes Father    Emphysema Father    Cancer Sister        breast   Asthma Sister    Cancer Brother        bladder   Coronary artery disease Brother    Coronary artery disease Maternal Grandmother    Heart disease Paternal Grandmother        enlarged heart   Stroke Paternal Grandfather    Allergic rhinitis Neg Hx    Angioedema Neg Hx    Atopy Neg Hx    Eczema Neg Hx    Immunodeficiency Neg Hx    Social History   Socioeconomic History   Marital status: Married    Spouse name: Not on file   Number of children: 2   Years of education: Not on file   Highest education level: Not on file  Occupational History   Occupation: librarian, academic, retired  Tobacco Use   Smoking status: Never   Smokeless tobacco: Never  Vaping Use   Vaping status: Never Used  Substance and Sexual Activity   Alcohol use: Yes    Comment: Occ glass of wine   Drug use: No   Sexual activity: Not on file  Other Topics Concern   Not on file  Social History Narrative   Retired librarian, academic   Married, 2 healthy boys    Regular exercise- yes, walking 3 days a week.   2006 treadmill stress test, low risk.      Moderate diet   No HCOPA, no living will, info given (reviewed 2015)   Social Drivers of Health   Financial Resource Strain: Low Risk  (09/29/2024)   Overall Financial Resource Strain (CARDIA)    Difficulty of Paying Living Expenses: Not hard at all  Food Insecurity: No Food Insecurity (09/29/2024)   Hunger Vital Sign    Worried About Running Out of Food in the Last Year: Never true    Ran Out of Food in the Last Year: Never true  Transportation Needs: No Transportation Needs (09/29/2024)   PRAPARE - Administrator, Civil Service (Medical): No    Lack of  Transportation (Non-Medical): No  Physical Activity: Insufficiently Active (09/29/2024)   Exercise Vital Sign    Days of Exercise per Week: 3 days    Minutes of Exercise per Session: 30 min  Stress: No Stress Concern Present (09/29/2024)   [employer] of Occupational Health - Occupational Stress Questionnaire    Feeling of Stress: Not at all  Social Connections: Moderately Integrated (09/29/2024)   Social Connection and Isolation Panel  Frequency of Communication with Friends and Family: Twice a week    Frequency of Social Gatherings with Friends and Family: More than three times a week    Attends Religious Services: More than 4 times per year    Active Member of [employer] or Organizations: No    Attends Engineer, Structural: Never    Marital Status: Married    Tobacco Counseling Counseling given: Not Answered    Clinical Intake:  Pre-visit preparation completed: Yes  Pain : 0-10 Pain Score: 2  Pain Location: Knee Pain Orientation: Right, Left Pain Descriptors / Indicators: Aching Pain Onset: More than a month ago Pain Frequency: Intermittent Pain Relieving Factors: supplements and tylenol  Effect of Pain on Daily Activities: cannot walk as much  Pain Relieving Factors: supplements and tylenol   BMI - recorded: 31.41 Nutritional Status: BMI > 30  Obese Nutritional Risks: None Diabetes: No  Lab Results  Component Value Date   HGBA1C 6.3 04/10/2024   HGBA1C 6.1 04/06/2023   HGBA1C 6.0 08/28/2022     How often do you need to have someone help you when you read instructions, pamphlets, or other written materials from your doctor or pharmacy?: 1 - Never  Interpreter Needed?: No  Comments: lives with husband Information entered by :: B.Rhealynn Myhre,LPN   Activities of Daily Living     09/29/2024    9:53 AM  In your present state of health, do you have any difficulty performing the following activities:  Hearing? 0  Vision? 0  Difficulty concentrating  or making decisions? 0  Walking or climbing stairs? 0  Dressing or bathing? 0  Doing errands, shopping? 0  Preparing Food and eating ? N  Using the Toilet? N  In the past six months, have you accidently leaked urine? N  Do you have problems with loss of bowel control? N  Managing your Medications? N  Managing your Finances? N  Housekeeping or managing your Housekeeping? N    Patient Care Team: Avelina Greig BRAVO, MD as PCP - General Mittie Gaskin, MD as Referring Physician (Ophthalmology)  I have updated your Care Teams any recent Medical Services you may have received from other providers in the past year.     Assessment:   This is a routine wellness examination for Milyn.  Hearing/Vision screen Hearing Screening - Comments:: Patient denies any hearing difficulties with hearing aids Vision Screening - Comments:: Pt says their vision is good without glasses;readers only [facility]/ Dr Mittie   Goals Addressed               This Visit's Progress     COMPLETED: Patient Stated   On track     10/06/2019, I will continue walking twice a week for 30 minutes.       COMPLETED: Patient Stated   On track     03/16/2022, wants to get dental work done      Patient Stated (pt-stated)   Not on track     09/29/24-Lose weight       Depression Screen     09/29/2024    9:49 AM 03/23/2024    2:06 PM 04/06/2023    8:40 AM 03/30/2023   11:01 AM 03/30/2023   10:57 AM 03/16/2022   11:36 AM 01/21/2021   10:56 AM  PHQ 2/9 Scores  PHQ - 2 Score 0 0 1 0 0 0 1  PHQ- 9 Score   5        Fall Risk  09/29/2024    9:33 AM 03/23/2024    2:06 PM 04/06/2023    8:40 AM 03/30/2023   11:01 AM 03/16/2022   11:36 AM  Fall Risk   Falls in the past year? 0 0 0 0 0  Number falls in past yr: 0 0 0 0 0  Injury with Fall? 0 0 0 0 0  Risk for fall due to : No Fall Risks No Fall Risks No Fall Risks No Fall Risks Medication side effect  Follow up Education provided;Falls prevention discussed  Falls evaluation completed Falls evaluation completed Falls prevention discussed;Education provided Falls evaluation completed;Education provided;Falls prevention discussed      Data saved with a previous flowsheet row definition    MEDICARE RISK AT HOME:  Medicare Risk at Home Any stairs in or around the home?: Yes If so, are there any without handrails?: Yes Home free of loose throw rugs in walkways, pet beds, electrical cords, etc?: Yes Adequate lighting in your home to reduce risk of falls?: Yes Life alert?: No Use of a cane, walker or w/c?: No Grab bars in the bathroom?: Yes Shower chair or bench in shower?: Yes Elevated toilet seat or a handicapped toilet?: Yes  TIMED UP AND GO:  Was the test performed?  Yes  Length of time to ambulate 10 feet: 13 sec Gait slow and steady without use of assistive device  Cognitive Function: 6CIT completed    10/06/2019   11:25 AM  MMSE - Mini Mental State Exam  Orientation to time 5  Orientation to Place 5  Registration 3  Attention/ Calculation 5  Recall 3  Language- repeat 1        09/29/2024    9:56 AM 03/30/2023   11:15 AM 03/30/2023   11:07 AM 03/16/2022   11:38 AM  6CIT Screen  What Year? 0 points 0 points 0 points 0 points  What month? 0 points 3 points 0 points 0 points  What time? 0 points 0 points 0 points 0 points  Count back from 20 0 points 0 points 0 points 0 points  Months in reverse 0 points 2 points 2 points 0 points  Repeat phrase 0 points 2 points 0 points 0 points  Total Score 0 points 7 points 2 points 0 points    Immunizations Immunization History  Administered Date(s) Administered   Fluad Quad(high Dose 65+) 10/10/2019, 01/21/2021   INFLUENZA, HIGH DOSE SEASONAL PF 09/22/2017, 09/29/2024   Influenza Split 12/14/2011, 08/04/2012   Influenza,inj,Quad PF,6+ Mos 10/05/2014   Influenza-Unspecified 07/31/2013, 08/30/2018   PFIZER(Purple Top)SARS-COV-2 Vaccination 12/15/2019, 01/05/2020, 10/31/2020    Pneumococcal Conjugate-13 10/05/2014   Pneumococcal Polysaccharide-23 12/28/2008   Td 10/30/2008   Zoster, Live 11/30/2012    Screening Tests Health Maintenance  Topic Date Due   Zoster Vaccines- Shingrix (1 of 2) 05/24/1993   DTaP/Tdap/Td (2 - Tdap) 10/30/2018   Mammogram  04/04/2024   COVID-19 Vaccine (4 - 2025-26 season) 07/31/2024   Medicare Annual Wellness (AWV)  09/29/2025   DEXA SCAN  04/04/2028   Pneumococcal Vaccine: 50+ Years  Completed   Influenza Vaccine  Completed   Meningococcal B Vaccine  Aged Out   Colonoscopy  Discontinued   Hepatitis C Screening  Discontinued    Health Maintenance Items Addressed: Vaccines Given today: Influenza  Additional Screening:  Vision Screening: Recommended annual ophthalmology exams for early detection of glaucoma and other disorders of the eye. Is the patient up to date with their annual eye exam?  Yes  Who is the provider or what is the name of the office in which the patient attends annual eye exams? Dr Mittie  Dental Screening: Recommended annual dental exams for proper oral hygiene  Community Resource Referral / Chronic Care Management: CRR required this visit?  No   CCM required this visit?  CPE appt made    Plan:    I have personally reviewed and noted the following in the patient's chart:   Medical and social history Use of alcohol, tobacco or illicit drugs  Current medications and supplements including opioid prescriptions. Patient is not currently taking opioid prescriptions. Functional ability and status Nutritional status Physical activity Advanced directives List of other physicians Hospitalizations, surgeries, and ER visits in previous 12 months Vitals Screenings to include cognitive, depression, and falls Referrals and appointments  In addition, I have reviewed and discussed with patient certain preventive protocols, quality metrics, and best practice recommendations. A written personalized care  plan for preventive services as well as general preventive health recommendations were provided to patient.   Erminio LITTIE Saris, LPN   [id number]   After Visit Summary: In person declined JCD:tpoo view in mychart  Notes: Nothing significant to report at this time.

## 2024-09-29 NOTE — Patient Instructions (Signed)
 Vanessa Jones,  Thank you for taking the time for your Medicare Wellness Visit. I appreciate your continued commitment to your health goals. Please review the care plan we discussed, and feel free to reach out if I can assist you further.  Medicare recommends these wellness visits once per year to help you and your care team stay ahead of potential health issues. These visits are designed to focus on prevention, allowing your provider to concentrate on managing your acute and chronic conditions during your regular appointments.  Please note that Annual Wellness Visits do not include a physical exam. Some assessments may be limited, especially if the visit was conducted virtually. If needed, we may recommend a separate in-person follow-up with your provider.  Ongoing Care Seeing your primary care provider every 3 to 6 months helps us  monitor your health and provide consistent, personalized care.   Referrals If a referral was made during today's visit and you haven't received any updates within two weeks, please contact the referred provider directly to check on the status.  Recommended Screenings:  Health Maintenance  Topic Date Due   Zoster (Shingles) Vaccine (1 of 2) 05/24/1993   DTaP/Tdap/Td vaccine (2 - Tdap) 10/30/2018   Breast Cancer Screening  04/04/2024   COVID-19 Vaccine (4 - 2025-26 season) 07/31/2024   Flu Shot  02/27/2025*   Medicare Annual Wellness Visit  09/29/2025   DEXA scan (bone density measurement)  04/04/2028   Pneumococcal Vaccine for age over 81  Completed   Meningitis B Vaccine  Aged Out   Colon Cancer Screening  Discontinued   Hepatitis C Screening  Discontinued  *Topic was postponed. The date shown is not the original due date.       07/07/2023   10:12 AM  Advanced Directives  Does Patient Have a Medical Advance Directive? No  Would patient like information on creating a medical advance directive? No - Patient declined   Advance Care Planning is important  because it: Ensures you receive medical care that aligns with your values, goals, and preferences. Provides guidance to your family and loved ones, reducing the emotional burden of decision-making during critical moments.  Vision: Annual vision screenings are recommended for early detection of glaucoma, cataracts, and diabetic retinopathy. These exams can also reveal signs of chronic conditions such as diabetes and high blood pressure.  Dental: Annual dental screenings help detect early signs of oral cancer, gum disease, and other conditions linked to overall health, including heart disease and diabetes.

## 2024-10-12 ENCOUNTER — Emergency Department
Admission: EM | Admit: 2024-10-12 | Discharge: 2024-10-12 | Disposition: A | Discharge status: Home / Self Care | Attending: Emergency Medicine | Admitting: Emergency Medicine

## 2024-10-12 ENCOUNTER — Other Ambulatory Visit: Payer: Self-pay

## 2024-10-12 DIAGNOSIS — R040 Epistaxis: Secondary | ICD-10-CM | POA: Diagnosis not present

## 2024-10-12 DIAGNOSIS — I10 Essential (primary) hypertension: Secondary | ICD-10-CM | POA: Insufficient documentation

## 2024-10-12 MED ORDER — OXYMETAZOLINE HCL 0.05 % NA SOLN
1.0000 | Freq: Once | NASAL | Status: AC
  Administered 2024-10-12: 1 via NASAL
  Filled 2024-10-12: qty 30

## 2024-10-12 NOTE — ED Triage Notes (Signed)
 Pt reports she began to have a nose bleed aprox 1 hour PTA, pt is not on blood thinners. Pt reports hx of same and has needed it to be cauterized.

## 2024-10-12 NOTE — ED Notes (Signed)
 Fall bundle verified

## 2024-10-12 NOTE — ED Provider Notes (Signed)
 [facility] Provider Note   Event Date/Time   First MD Initiated Contact with Patient 10/12/24 0701     (approximate)  History   Epistaxis  HPI  Vanessa Jones is a 83 y.o. female forts she suffers from chronic allergies has been having a dry nose recently.  This morning she woke up with bleeding from her left nose.  There is running up the left nose and some down the back of her throat.  She tried pinching for several minutes but it did not improve.  Continue to bleed.  She does not take any blood thinners  No pain or discomfort.  She can still feel blood running slightly down the back of her throat and some coming up the left side of the nose at times.  She has had a nosebleed once in the past she reports was about 20 years ago and had to have it cauterized   Past Medical History:  Diagnosis Date   Allergic dermatitis    Allergic rhinitis    Bilateral sensorineural hearing loss    Complex tear of medial meniscus of left knee    Diverticulitis    Eczema    GERD (gastroesophageal reflux disease)    History of chickenpox    Hyperlipidemia    Hypertension    Osteoarthritis    LEFT KNEE   Seasonal allergies    Serrated adenoma of colon    Tubular adenoma of colon    She reports that it bled for several minutes, did not experience massive bleeding  Physical Exam   Triage Vital Signs: ED Triage Vitals  Encounter Vitals Group     BP 10/12/24 0637 (!) 173/89     Girls Systolic BP Percentile --      Girls Diastolic BP Percentile --      Boys Systolic BP Percentile --      Boys Diastolic BP Percentile --      Pulse Rate 10/12/24 0637 (!) 110     Resp 10/12/24 0637 19     Temp 10/12/24 0637 97.6 F (36.4 C)     Temp src --      SpO2 10/12/24 0637 98 %     Weight 10/12/24 0637 180 lb (81.6 kg)     Height 10/12/24 0637 5' 6 (1.676 m)     Head Circumference --      Peak Flow --      Pain Score 10/12/24 0636 0     Pain Loc --      Pain  Education --      Exclude from Growth Chart --     Most recent vital signs: Vitals:   10/12/24 0650 10/12/24 0710  BP: (!) 145/74   Pulse: 99   Resp:    Temp:    SpO2: 94% 95%     General: Awake, no distress.  Normocephalic atraumatic  Right nare normal no bleeding.  Left nare slight dried blood around it.  To inspection inside there is a small area in the area of Herriman box plexus that appears to have a clot forming over it.  There does not appear to be large amount of active bleeding but there is a very slight amount of venous oozing coming from this region.  The posterior oropharynx shows slight blood in the posterior pharynx but not actively draining down it  CV:  Good peripheral perfusion.  Resp:  Normal effort.  Normal work of breathing.  No cough no  difficulty breathing Abd:  No distention.  Other:     ED Results / Procedures / Treatments   Labs (all labs ordered are listed, but only abnormal results are displayed) Labs Reviewed - No data to display   EKG     RADIOLOGY     PROCEDURES:  Critical Care performed: No  Epistaxis Management  Date/Time: 10/12/2024 9:07 AM  Performed by: Dicky Anes, MD Authorized by: Dicky Anes, MD   Consent:    Consent obtained:  Verbal   Consent given by:  Patient   Alternatives discussed:  Observation Universal protocol:    Patient identity confirmed:  Verbally with patient Procedure details:    Treatment site:  L anterior   Repair method: afrin spray 5 times.   Treatment complexity:  Limited   Treatment episode: initial   Post-procedure details:    Assessment:  Bleeding stopped   Procedure completion:  Tolerated Comments:     After 1 spray.  Applied nasal clamp.  Nasal clamp removed after approximately 30 minutes by paramedic.  I reexamined her at approximately 8:45 AM, there is no further bleeding she feels well.  She has had the clamp off now for about an hour, no further bleeding.  Posterior oropharynx also  examined no bleeding.    MEDICATIONS ORDERED IN ED: Medications  oxymetazoline (AFRIN) 0.05 % nasal spray 1 spray (1 spray Each Nare Given 10/12/24 0646)     IMPRESSION / MDM / ASSESSMENT AND PLAN / ED COURSE  I reviewed the triage vital signs and the nursing notes.                              Differential diagnosis includes, but is not limited to, epistaxis, bleeding from the anterior posterior naris, no evidence of oral or pharyngeal bleeding.  There is no noted mass.  She is fully alert well-oriented vital signs reassuring slightly hypertensive after oxymetazoline    Patient's presentation is most consistent with acute illness.   Return precautions and treatment recommendations and follow-up discussed with the patient who is agreeable with the plan.  Recommend she follow-up with ENT.  Provided her Afrin and nasal clamp as well as instruction on use and careful return precautions.  If she sees recurrent heavy bleeding, bleeding that will not rapidly improve with Afrin or clamp or continuous bleeding running down the back of her throat or becomes any severe or concerning she will return to the ER right away.        FINAL CLINICAL IMPRESSION(S) / ED DIAGNOSES   Final diagnoses:  Left-sided epistaxis     Rx / DC Orders   ED Discharge Orders     None        Note:  This document was prepared using Dragon voice recognition software and may include unintentional dictation errors.   Dicky Anes, MD 10/12/24 [phone]

## 2024-10-12 NOTE — ED Notes (Signed)
 Pt's nose clamp removed per Dr. Dicky.

## 2024-12-21 ENCOUNTER — Other Ambulatory Visit: Payer: Self-pay | Admitting: Family Medicine

## 2025-04-26 ENCOUNTER — Encounter: Admitting: Family Medicine

## 2025-10-01 ENCOUNTER — Ambulatory Visit

## 2025-10-04 ENCOUNTER — Ambulatory Visit

## 2025-10-05 ENCOUNTER — Ambulatory Visit
